# Patient Record
Sex: Female | Born: 1970 | Race: White | Hispanic: No | Marital: Married | Smoking: Never smoker
Health system: Southern US, Community
[De-identification: ages and names within clinical notes are randomized; demographics above are authoritative.]

## PROBLEM LIST (undated history)

## (undated) DIAGNOSIS — T8859XA Other complications of anesthesia, initial encounter: Secondary | ICD-10-CM

## (undated) DIAGNOSIS — I10 Essential (primary) hypertension: Secondary | ICD-10-CM

## (undated) DIAGNOSIS — K589 Irritable bowel syndrome without diarrhea: Secondary | ICD-10-CM

## (undated) DIAGNOSIS — R112 Nausea with vomiting, unspecified: Secondary | ICD-10-CM

## (undated) DIAGNOSIS — E669 Obesity, unspecified: Secondary | ICD-10-CM

## (undated) DIAGNOSIS — K649 Unspecified hemorrhoids: Secondary | ICD-10-CM

## (undated) DIAGNOSIS — G4730 Sleep apnea, unspecified: Secondary | ICD-10-CM

## (undated) DIAGNOSIS — N979 Female infertility, unspecified: Secondary | ICD-10-CM

## (undated) DIAGNOSIS — N83209 Unspecified ovarian cyst, unspecified side: Secondary | ICD-10-CM

## (undated) DIAGNOSIS — Z9889 Other specified postprocedural states: Secondary | ICD-10-CM

## (undated) DIAGNOSIS — F3281 Premenstrual dysphoric disorder: Secondary | ICD-10-CM

## (undated) DIAGNOSIS — F32A Depression, unspecified: Secondary | ICD-10-CM

## (undated) DIAGNOSIS — Z9289 Personal history of other medical treatment: Secondary | ICD-10-CM

## (undated) DIAGNOSIS — F419 Anxiety disorder, unspecified: Secondary | ICD-10-CM

## (undated) DIAGNOSIS — N809 Endometriosis, unspecified: Secondary | ICD-10-CM

## (undated) DIAGNOSIS — K219 Gastro-esophageal reflux disease without esophagitis: Secondary | ICD-10-CM

## (undated) DIAGNOSIS — M1990 Unspecified osteoarthritis, unspecified site: Secondary | ICD-10-CM

## (undated) DIAGNOSIS — F329 Major depressive disorder, single episode, unspecified: Secondary | ICD-10-CM

## (undated) DIAGNOSIS — IMO0001 Reserved for inherently not codable concepts without codable children: Secondary | ICD-10-CM

## (undated) DIAGNOSIS — R519 Headache, unspecified: Secondary | ICD-10-CM

## (undated) DIAGNOSIS — R42 Dizziness and giddiness: Secondary | ICD-10-CM

## (undated) DIAGNOSIS — Z8489 Family history of other specified conditions: Secondary | ICD-10-CM

## (undated) DIAGNOSIS — J329 Chronic sinusitis, unspecified: Secondary | ICD-10-CM

## (undated) DIAGNOSIS — L900 Lichen sclerosus et atrophicus: Secondary | ICD-10-CM

## (undated) DIAGNOSIS — N946 Dysmenorrhea, unspecified: Secondary | ICD-10-CM

## (undated) DIAGNOSIS — F319 Bipolar disorder, unspecified: Secondary | ICD-10-CM

## (undated) DIAGNOSIS — T4145XA Adverse effect of unspecified anesthetic, initial encounter: Secondary | ICD-10-CM

## (undated) DIAGNOSIS — A6000 Herpesviral infection of urogenital system, unspecified: Secondary | ICD-10-CM

## (undated) HISTORY — DX: Essential (primary) hypertension: I10

## (undated) HISTORY — DX: Anxiety disorder, unspecified: F41.9

## (undated) HISTORY — DX: Lichen sclerosus et atrophicus: L90.0

## (undated) HISTORY — DX: Female infertility, unspecified: N97.9

## (undated) HISTORY — DX: Unspecified hemorrhoids: K64.9

## (undated) HISTORY — DX: Herpesviral infection of urogenital system, unspecified: A60.00

## (undated) HISTORY — DX: Major depressive disorder, single episode, unspecified: F32.9

## (undated) HISTORY — PX: OVARIAN CYST REMOVAL: SHX89

## (undated) HISTORY — DX: Personal history of other medical treatment: Z92.89

## (undated) HISTORY — DX: Irritable bowel syndrome, unspecified: K58.9

## (undated) HISTORY — PX: DIAGNOSTIC LAPAROSCOPY: SUR761

## (undated) HISTORY — DX: Chronic sinusitis, unspecified: J32.9

## (undated) HISTORY — DX: Gastro-esophageal reflux disease without esophagitis: K21.9

## (undated) HISTORY — DX: Depression, unspecified: F32.A

## (undated) HISTORY — DX: Obesity, unspecified: E66.9

## (undated) HISTORY — PX: URETHRAL DILATION: SUR417

## (undated) HISTORY — DX: Endometriosis, unspecified: N80.9

## (undated) HISTORY — DX: Unspecified ovarian cyst, unspecified side: N83.209

## (undated) HISTORY — DX: Premenstrual dysphoric disorder: F32.81

## (undated) HISTORY — DX: Dysmenorrhea, unspecified: N94.6

## (undated) HISTORY — DX: Bipolar disorder, unspecified: F31.9

---

## 1996-11-25 DIAGNOSIS — N83209 Unspecified ovarian cyst, unspecified side: Secondary | ICD-10-CM

## 1996-11-25 HISTORY — DX: Unspecified ovarian cyst, unspecified side: N83.209

## 2006-02-11 ENCOUNTER — Ambulatory Visit: Payer: Self-pay | Admitting: Otolaryngology

## 2011-02-15 ENCOUNTER — Ambulatory Visit: Payer: Self-pay | Admitting: Podiatry

## 2011-10-24 ENCOUNTER — Emergency Department: Payer: Self-pay | Admitting: Emergency Medicine

## 2013-11-25 HISTORY — PX: UPPER GI ENDOSCOPY: SHX6162

## 2014-02-17 ENCOUNTER — Ambulatory Visit: Payer: Self-pay | Admitting: Gastroenterology

## 2014-02-18 LAB — PATHOLOGY REPORT

## 2015-03-08 DIAGNOSIS — Z9289 Personal history of other medical treatment: Secondary | ICD-10-CM

## 2015-03-08 HISTORY — DX: Personal history of other medical treatment: Z92.89

## 2015-03-22 ENCOUNTER — Ambulatory Visit: Payer: Self-pay | Admitting: Cardiovascular Disease

## 2015-03-22 ENCOUNTER — Ambulatory Visit (INDEPENDENT_AMBULATORY_CARE_PROVIDER_SITE_OTHER): Payer: 59 | Admitting: Cardiovascular Disease

## 2015-03-22 ENCOUNTER — Encounter: Payer: Self-pay | Admitting: Cardiovascular Disease

## 2015-03-22 ENCOUNTER — Encounter (INDEPENDENT_AMBULATORY_CARE_PROVIDER_SITE_OTHER): Payer: Self-pay

## 2015-03-22 VITALS — BP 120/92 | HR 94 | Ht 64.0 in | Wt 234.0 lb

## 2015-03-22 DIAGNOSIS — I10 Essential (primary) hypertension: Secondary | ICD-10-CM | POA: Insufficient documentation

## 2015-03-22 DIAGNOSIS — F319 Bipolar disorder, unspecified: Secondary | ICD-10-CM | POA: Insufficient documentation

## 2015-03-22 DIAGNOSIS — R079 Chest pain, unspecified: Secondary | ICD-10-CM | POA: Diagnosis not present

## 2015-03-22 DIAGNOSIS — F32A Depression, unspecified: Secondary | ICD-10-CM | POA: Insufficient documentation

## 2015-03-22 DIAGNOSIS — Z9289 Personal history of other medical treatment: Secondary | ICD-10-CM

## 2015-03-22 DIAGNOSIS — R600 Localized edema: Secondary | ICD-10-CM | POA: Diagnosis not present

## 2015-03-22 DIAGNOSIS — R0602 Shortness of breath: Secondary | ICD-10-CM | POA: Insufficient documentation

## 2015-03-22 DIAGNOSIS — F329 Major depressive disorder, single episode, unspecified: Secondary | ICD-10-CM

## 2015-03-22 HISTORY — DX: Personal history of other medical treatment: Z92.89

## 2015-03-22 MED ORDER — FUROSEMIDE 20 MG PO TABS: 20.0000 mg | ORAL_TABLET | Freq: Two times a day (BID) | ORAL | Status: DC | PRN

## 2015-03-22 MED ORDER — POTASSIUM CHLORIDE ER 10 MEQ PO TBCR: 10.0000 meq | EXTENDED_RELEASE_TABLET | Freq: Two times a day (BID) | ORAL | Status: DC | PRN

## 2015-03-22 NOTE — Assessment & Plan Note (Signed)
I suspect her weight is contributing to much of her problems. This was discussed with her. Recommended no [facility], dietary changes, cutting out fast food, starting her walking program. If unable to ambulate, would try water aerobics, low impact exercise

## 2015-03-22 NOTE — Progress Notes (Signed)
Patient ID: Ashley Floyd, female    DOB: October 02, 1971, 44 y.o.   MRN: [id number]  HPI Comments: [name] is a 44 year old woman with obesity, bipolar disorder, depression, previous primary care through [facility] (no longer available), who presents for evaluation of her hypertension and chest tightness, shortness of breath. She reports weight gain, dietary indiscretion. Significant family history of diabetes.  She reports having high blood pressure noted fall of 2015 through work screening. 141/100. Started on HCTZ 25 mg daily. Did not seem to help her blood pressure. Was changed to losartan HCTZ 50/12.5 mill grams daily. Blood pressure improved. She started having palpitations felt in her face, commonly at nighttime. Started on metoprolol 25 mg. She takes this in the morning. This has helped both headaches and palpitations.  She is concerned about effect of metoprolol and causing diabetes. She does have recent weight gain and concerned this is medication related. Drinks [employer] through the day, fast food, no exercise.  She reports having significant swelling in her hands, abdomen, tightness in her chest at times with some shortness of breath on exertion. She reports changes to her hair, unclear if this is from blood pressure pills. Previously was walking 3 miles per day, lost 54 pounds. After separation and secondary to depression, became inactive/sedentary. Also with discomfort in her feet EKG on today's visit shows normal sinus rhythm with rate 94 bpm, no significant ST or T-wave changes  Recent lab work October 2015 showing normal BMP, low vitamin D    Allergies  Allergen Reactions  . Cephalosporins Rash  . Other Other (See Comments)    Uncoded Allergy. Allergen: amiceft, Other Reaction: Not Assessed  . Penicillins Other (See Comments)    Yeast  Rash     Outpatient Encounter Prescriptions as of 03/22/2015  Medication Sig  . carbamazepine (TEGRETOL) 200 MG tablet Take  200 mg by mouth 3 (three) times daily.   . clonazePAM (KLONOPIN) 1 MG tablet Take 1 mg by mouth 3 (three) times daily as needed.   Marland Kitchen DEXILANT 60 MG capsule Take 60 mg by mouth daily.   Marland Kitchen losartan-hydrochlorothiazide (HYZAAR) 50-12.5 MG per tablet Take 1 tablet by mouth daily.   . metoprolol tartrate (LOPRESSOR) 25 MG tablet Take 25 mg by mouth 2 (two) times daily.   . naproxen sodium (ANAPROX) 550 MG tablet Take 550 mg by mouth 2 (two) times daily with a meal.   . triamcinolone (NASACORT) 55 MCG/ACT AERO nasal inhaler Place 2 sprays into the nose daily.  . valACYclovir (VALTREX) 500 MG tablet Take 500 mg by mouth 3 (three) times daily as needed.   . Vitamin D, Ergocalciferol, (DRISDOL) 50000 UNITS CAPS capsule Take 50,000 Units by mouth every 7 (seven) days.  . furosemide (LASIX) 20 MG tablet Take 1 tablet (20 mg total) by mouth 2 (two) times daily as needed.  . potassium chloride (K-DUR) 10 MEQ tablet Take 1 tablet (10 mEq total) by mouth 2 (two) times daily as needed.  . [DISCONTINUED] hydrochlorothiazide (HYDRODIURIL) 25 MG tablet Take 25 mg by mouth daily.  . [DISCONTINUED] lamoTRIgine (LAMICTAL) 200 MG tablet Take 200 mg by mouth daily.   . [DISCONTINUED] zaleplon (SONATA) 10 MG capsule Take 10 mg by mouth at bedtime as needed for sleep.    Past Medical History  Diagnosis Date  . Depression   . Dysmenorrhea   . Endometriosis   . GERD (gastroesophageal reflux disease)   . Herpes genitalis   . IBS (irritable bowel syndrome)   .  Obesity   . Ovarian cyst 1998    right  . Bipolar affective disorder   . Hypertension     Past Surgical History  Procedure Laterality Date  . Ovarian cyst removal    . Urethral dilation      Social History  reports that she has never smoked. She does not have any smokeless tobacco history on file. She reports that she does not drink alcohol or use illicit drugs.  Family History family history includes Arrhythmia in her mother; Heart attack in her  paternal grandmother; Heart disease in her maternal aunt and paternal grandmother; Heart failure in her maternal aunt; Hypertension in her maternal grandfather and maternal grandmother.  Review of Systems  Constitutional: Negative.   Respiratory: Positive for shortness of breath.   Cardiovascular: Positive for leg swelling.  Gastrointestinal: Negative.   Musculoskeletal: Positive for arthralgias.       Swelling of her hands  Skin: Negative.   Neurological: Positive for headaches.  Hematological: Negative.   Psychiatric/Behavioral: Negative.   All other systems reviewed and are negative.   BP 120/92 mmHg  Pulse 94  Ht 5\' 4"  (1.626 m)  Wt 234 lb (106.142 kg)  BMI 40.15 kg/m2  Physical Exam  Constitutional: She is oriented to person, place, and time. She appears well-developed and well-nourished.  HENT:  Head: Normocephalic.  Nose: Nose normal.  Mouth/Throat: Oropharynx is clear and moist.  Eyes: Conjunctivae are normal. Pupils are equal, round, and reactive to light.  Neck: Normal range of motion. Neck supple. No JVD present.  Cardiovascular: Normal rate, regular rhythm, S1 normal, S2 normal, normal heart sounds and intact distal pulses.  Exam reveals no gallop and no friction rub.   No murmur heard. Pulmonary/Chest: Effort normal and breath sounds normal. No respiratory distress. She has no wheezes. She has no rales. She exhibits no tenderness.  Abdominal: Soft. Bowel sounds are normal. She exhibits no distension. There is no tenderness.  Musculoskeletal: Normal range of motion. She exhibits no edema or tenderness.  Lymphadenopathy:    She has no cervical adenopathy.  Neurological: She is alert and oriented to person, place, and time. Coordination normal.  Skin: Skin is warm and dry. No rash noted. No erythema.  Psychiatric: She has a normal mood and affect. Her behavior is normal. Judgment and thought content normal.    Assessment and Plan  Nursing note and vitals  reviewed.

## 2015-03-22 NOTE — Patient Instructions (Addendum)
Consider moving the metoprolol to dinner time or bedtime, If this makes you tired in the AM, Call the office, We could try propranolol  Ok to stop or wean the metoprolol (try a 1/2 pill), ok to take as needed  Please take lasix as needed with potassium for leg swelling, hand/finger swelling, shortness of breath  We will schedule an echocardiogram for shortness of breath, leg edema  Monitor your blood pressure, consider a home cuff  Please call us if you have new issues that need to be addressed before your next appt.

## 2015-03-22 NOTE — Assessment & Plan Note (Signed)
Previous depression likely contributing to sedentary lifestyle, inactivity, weight gain Recommended she restart a walking program

## 2015-03-22 NOTE — Assessment & Plan Note (Signed)
Shortness of breath seems to contribute to her mild chest tightness. Again likely from deconditioning, also fluid retention We'll start Lasix

## 2015-03-22 NOTE — Assessment & Plan Note (Addendum)
Shortness of breath possibly from fluid retention, diastolic dysfunction and heart failure. We will start Lasix when necessary with potassium. Fluid retention likely exacerbated by obesity. Recommended weight loss We'll need to monitor closely for sleep apnea  echocardiogram has been ordered to rule out structural heart disease, pulmonary hypertension

## 2015-03-22 NOTE — Assessment & Plan Note (Signed)
Very mild edema likely exacerbated by obesity. Unable to exclude mild fluid retention

## 2015-03-22 NOTE — Assessment & Plan Note (Signed)
High risk of diastolic CHF given her body habitus, high fluid and salt intake. She has swelling in her hands, legs, abdomen, weight gain. Also with shortness of breath, chest tightness with exertion. Recommended she try Lasix as needed with potassium. Recommended she try to cut back on her soft drinks, and high volume fluids in general. Other medications The same except suggested she either cut down on her metoprolol or wean off if she is concerned about the possibility this may cause diabetes Other option would be to use propranolol when necessary for palpitations at nighttime but not on a regular basis

## 2015-04-20 ENCOUNTER — Telehealth: Payer: Self-pay | Admitting: *Deleted

## 2015-04-20 DIAGNOSIS — R600 Localized edema: Secondary | ICD-10-CM

## 2015-04-20 DIAGNOSIS — R0602 Shortness of breath: Secondary | ICD-10-CM

## 2015-04-20 NOTE — Telephone Encounter (Signed)
ECHO orders were placed by Dr. Rockey Situ at pt's ov 03/22/15. We will need to wait on pt's ECHO results before adjusting her meds.

## 2015-04-20 NOTE — Telephone Encounter (Signed)
Pt is calling asking if we can put in her orders for Echo, she is coming tmrw  Pt is asking also if she can up the dose to 50/25 on Fluid pills She was told to play around with this to better fit her situation, but it just asking.  Losartan to  50  HTCZ to  25   If this is  okay, please [facility] to [facility] [place] not the [facility].  Please advise.

## 2015-04-21 ENCOUNTER — Other Ambulatory Visit: Payer: Self-pay | Admitting: *Deleted

## 2015-04-21 ENCOUNTER — Other Ambulatory Visit: Payer: Self-pay

## 2015-04-21 ENCOUNTER — Ambulatory Visit (INDEPENDENT_AMBULATORY_CARE_PROVIDER_SITE_OTHER): Payer: 59

## 2015-04-21 DIAGNOSIS — R0602 Shortness of breath: Secondary | ICD-10-CM | POA: Diagnosis not present

## 2015-04-21 DIAGNOSIS — R600 Localized edema: Secondary | ICD-10-CM

## 2015-04-21 MED ORDER — LOSARTAN POTASSIUM-HCTZ 50-12.5 MG PO TABS: 1.0000 | ORAL_TABLET | Freq: Every day | ORAL | Status: DC

## 2015-04-21 NOTE — Telephone Encounter (Signed)
[facility] [phone]

## 2015-06-21 ENCOUNTER — Other Ambulatory Visit: Payer: Self-pay

## 2015-06-21 MED ORDER — LOSARTAN POTASSIUM-HCTZ 50-12.5 MG PO TABS: 1.0000 | ORAL_TABLET | Freq: Every day | ORAL | Status: DC

## 2015-06-28 ENCOUNTER — Other Ambulatory Visit: Payer: Self-pay

## 2015-06-28 DIAGNOSIS — K219 Gastro-esophageal reflux disease without esophagitis: Secondary | ICD-10-CM

## 2015-06-28 MED ORDER — DEXLANSOPRAZOLE 60 MG PO CPDR: 60.0000 mg | DELAYED_RELEASE_CAPSULE | Freq: Every day | ORAL | Status: DC

## 2015-07-14 ENCOUNTER — Encounter: Payer: Self-pay | Admitting: Gynecology

## 2015-07-14 ENCOUNTER — Other Ambulatory Visit: Payer: Self-pay

## 2015-07-14 ENCOUNTER — Telehealth: Payer: Self-pay | Admitting: Gastroenterology

## 2015-07-14 ENCOUNTER — Ambulatory Visit
Admission: EM | Admit: 2015-07-14 | Discharge: 2015-07-14 | Disposition: A | Discharge status: Home / Self Care | Payer: 59 | Attending: Family Medicine | Admitting: Family Medicine

## 2015-07-14 DIAGNOSIS — Z8719 Personal history of other diseases of the digestive system: Secondary | ICD-10-CM

## 2015-07-14 DIAGNOSIS — R0789 Other chest pain: Secondary | ICD-10-CM | POA: Diagnosis not present

## 2015-07-14 DIAGNOSIS — K219 Gastro-esophageal reflux disease without esophagitis: Secondary | ICD-10-CM | POA: Diagnosis present

## 2015-07-14 DIAGNOSIS — R079 Chest pain, unspecified: Secondary | ICD-10-CM | POA: Diagnosis not present

## 2015-07-14 MED ORDER — GI COCKTAIL ~~LOC~~
30.0000 mL | Freq: Once | ORAL | Status: AC
  Administered 2015-07-14: 30 mL via ORAL

## 2015-07-14 MED ORDER — SUCRALFATE 1 G PO TABS: 2.0000 g | ORAL_TABLET | Freq: Two times a day (BID) | ORAL | Status: DC

## 2015-07-14 NOTE — ED Provider Notes (Signed)
CSN: [id number]     Arrival date & time 07/14/15  1721 History   First MD Initiated Contact with Patient 07/14/15 1810     Chief Complaint  Patient presents with  . Gastrophageal Reflux   (Consider location/radiation/quality/duration/timing/severity/associated sxs/prior Treatment) Patient is a 44 y.o. female presenting with GERD. The history is provided by the patient and the spouse. No language interpreter was used.  Gastrophageal Reflux This is a recurrent problem. The current episode started more than 1 week ago (Off and on last 2 weeks). The problem occurs constantly. The problem has been gradually worsening. Associated symptoms include chest pain and abdominal pain. Pertinent negatives include no headaches and no shortness of breath. The symptoms are aggravated by bending. Nothing relieves the symptoms. Treatments tried: She's been using an acids and PPIs without success.      Patient's medical history was reviewed she has been extensive history of workup for this reflux and GI disease she's had cardiac workup in the past as well as endoscopy showing early Barrett's esophagitis. She is to have evaluation by special gastroneurologist for diagnosis hernia and hopefully or possible endoscopy or surgical treatment for it. She talked to her regular gastrologist intake nurse who suggested she come here for GI cocktail to see if it would help.  Her mother has been diagnosed with Barrett's esophagitis.   while she never smoked she did state that the first 10 years of her life she did experience that hand smoke from her mother.    Past Medical History  Diagnosis Date  . Depression   . Dysmenorrhea   . Endometriosis   . GERD (gastroesophageal reflux disease)   . Herpes genitalis   . IBS (irritable bowel syndrome)   . Obesity   . Ovarian cyst 1998    right  . Bipolar affective disorder   . Hypertension    Past Surgical History  Procedure Laterality Date  . Ovarian cyst removal    .  Urethral dilation     Family History  Problem Relation Age of Onset  . Arrhythmia Mother   . Hypertension Maternal Grandmother   . Hypertension Maternal Grandfather   . Heart disease Paternal Grandmother   . Heart attack Paternal Grandmother   . Heart failure Maternal Aunt   . Heart disease Maternal Aunt     CABG   Social History  Substance Use Topics  . Smoking status: Never Smoker   . Smokeless tobacco: None  . Alcohol Use: No   OB History    No data available     Review of Systems  Respiratory: Negative for shortness of breath.   Cardiovascular: Positive for chest pain.  Gastrointestinal: Positive for abdominal pain.  Neurological: Negative for headaches.    Allergies  Cephalosporins; Other; and Penicillins  Home Medications   Prior to Admission medications   Medication Sig Start Date End Date Taking? Authorizing Provider  carbamazepine (TEGRETOL) 200 MG tablet Take 200 mg by mouth 3 (three) times daily.  01/23/15  Yes Historical Provider, MD  clonazePAM (KLONOPIN) 1 MG tablet Take 1 mg by mouth 3 (three) times daily as needed.  01/30/15  Yes Historical Provider, MD  dexlansoprazole (DEXILANT) 60 MG capsule Take 1 capsule (60 mg total) by mouth daily. 06/28/15  Yes [name], MD  furosemide (LASIX) 20 MG tablet Take 1 tablet (20 mg total) by mouth 2 (two) times daily as needed. 03/22/15  Yes Minna Merritts, MD  losartan-hydrochlorothiazide (HYZAAR) 50-12.5 MG per tablet Take 1  tablet by mouth daily. 06/21/15  Yes Minna Merritts, MD  naproxen sodium (ANAPROX) 550 MG tablet Take 550 mg by mouth 2 (two) times daily with a meal.  03/08/15  Yes Historical Provider, MD  potassium chloride (K-DUR) 10 MEQ tablet Take 1 tablet (10 mEq total) by mouth 2 (two) times daily as needed. 03/22/15  Yes Minna Merritts, MD  triamcinolone (NASACORT) 55 MCG/ACT AERO nasal inhaler Place 2 sprays into the nose daily.   Yes Historical Provider, MD  valACYclovir (VALTREX) 500 MG tablet Take 500  mg by mouth 3 (three) times daily as needed.  03/08/15  Yes Historical Provider, MD  metoprolol tartrate (LOPRESSOR) 25 MG tablet Take 25 mg by mouth 2 (two) times daily.  03/15/15   Historical Provider, MD  sucralfate (CARAFATE) 1 G tablet Take 2 tablets (2 g total) by mouth 2 (two) times daily. An hour before eating. 07/14/15   Frederich Cha, MD  Vitamin D, Ergocalciferol, (DRISDOL) 50000 UNITS CAPS capsule Take 50,000 Units by mouth every 7 (seven) days.    Historical Provider, MD   BP 121/76 mmHg  Pulse 100  Temp(Src) 98.5 F (36.9 C) (Oral)  Resp 20  Ht 5\' 4"  (1.626 m)  Wt 234 lb (106.142 kg)  BMI 40.15 kg/m2  SpO2 98%  LMP 07/01/2015 Physical Exam  Constitutional: She is oriented to person, place, and time. She appears well-developed and well-nourished.  Obese white female  HENT:  Head: Normocephalic and atraumatic.  Eyes: Pupils are equal, round, and reactive to light.  Neck: Normal range of motion. No tracheal deviation present. No thyromegaly present.  Cardiovascular: Normal rate, regular rhythm and normal heart sounds.   Pulmonary/Chest: Effort normal and breath sounds normal. No respiratory distress. She has no wheezes.  Abdominal: Soft. Bowel sounds are normal. There is tenderness.  Musculoskeletal: Normal range of motion. She exhibits no edema.  Neurological: She is alert and oriented to person, place, and time.  Skin: Skin is warm and dry.  Psychiatric: She has a normal mood and affect. Her behavior is normal.  Vitals reviewed.   ED Course  Procedures (including critical care time) Labs Review Labs Reviewed - No data to display  Imaging Review No results found.   EKG was normal sinus rhythm.  MDM   1. H/O gastroesophageal reflux (GERD)   2. Other chest pain       Patient had much improvement after GI cocktail. We'll place her on Carafate 2 tablets twice a day follow-up with the GI specialist at her scheduled appointment work note for today.   Frederich Cha,  MD 07/14/15 [phone]

## 2015-07-14 NOTE — Discharge Instructions (Signed)
Chest Pain (Nonspecific) It is often hard to give a diagnosis for the cause of chest pain. There is always a chance that your pain could be related to something serious, such as a heart attack or a blood clot in the lungs. You need to follow up with your doctor. HOME CARE  If antibiotic medicine was given, take it as directed by your doctor. Finish the medicine even if you start to feel better.  For the next few days, avoid activities that bring on chest pain. Continue physical activities as told by your doctor.  Do not use any tobacco products. This includes cigarettes, chewing tobacco, and e-cigarettes.  Avoid drinking alcohol.  Only take medicine as told by your doctor.  Follow your doctor's suggestions for more testing if your chest pain does not go away.  Keep all doctor visits you made. GET HELP IF:  Your chest pain does not go away, even after treatment.  You have a rash with blisters on your chest.  You have a fever. GET HELP RIGHT AWAY IF:   You have more pain or pain that spreads to your arm, neck, jaw, back, or belly (abdomen).  You have shortness of breath.  You cough more than usual or cough up blood.  You have very bad back or belly pain.  You feel sick to your stomach (nauseous) or throw up (vomit).  You have very bad weakness.  You pass out (faint).  You have chills. This is an emergency. Do not wait to see if the problems will go away. Call your local emergency services (911 in [place] Do not drive yourself to the hospital. MAKE SURE YOU:   Understand these instructions.  Will watch your condition.  Will get help right away if you are not doing well or get worse. Document Released: 04/29/2008 Document Revised: 11/16/2013 Document Reviewed: 04/29/2008 [facility] Patient Information 2015 [place], [place]. This information is not intended to replace advice given to you by your health care provider. Make sure you discuss any questions you have with your  health care provider. Gastroesophageal Reflux Disease, Adult Gastroesophageal reflux disease (GERD) happens when acid from your stomach flows up into the esophagus. When acid comes in contact with the esophagus, the acid causes soreness (inflammation) in the esophagus. Over time, GERD may create small holes (ulcers) in the lining of the esophagus. CAUSES   Increased body weight. This puts pressure on the stomach, making acid rise from the stomach into the esophagus.  Smoking. This increases acid production in the stomach.  Drinking alcohol. This causes decreased pressure in the lower esophageal sphincter (valve or ring of muscle between the esophagus and stomach), allowing acid from the stomach into the esophagus.  Late evening meals and a full stomach. This increases pressure and acid production in the stomach.  A malformed lower esophageal sphincter. Sometimes, no cause is found. SYMPTOMS   Burning pain in the lower part of the mid-chest behind the breastbone and in the mid-stomach area. This may occur twice a week or more often.  Trouble swallowing.  Sore throat.  Dry cough.  Asthma-like symptoms including chest tightness, shortness of breath, or wheezing. DIAGNOSIS  Your caregiver may be able to diagnose GERD based on your symptoms. In some cases, X-rays and other tests may be done to check for complications or to check the condition of your stomach and esophagus. TREATMENT  Your caregiver may recommend over-the-counter or prescription medicines to help decrease acid production. Ask your caregiver before starting or  adding any new medicines.  HOME CARE INSTRUCTIONS   Change the factors that you can control. Ask your caregiver for guidance concerning weight loss, quitting smoking, and alcohol consumption.  Avoid foods and drinks that make your symptoms worse, such as:  Caffeine or alcoholic drinks.  Chocolate.  Peppermint or mint flavorings.  Garlic and onions.  Spicy  foods.  Citrus fruits, such as oranges, lemons, or limes.  Tomato-based foods such as sauce, chili, salsa, and pizza.  Fried and fatty foods.  Avoid lying down for the 3 hours prior to your bedtime or prior to taking a nap.  Eat small, frequent meals instead of large meals.  Wear loose-fitting clothing. Do not wear anything tight around your waist that causes pressure on your stomach.  Raise the head of your bed 6 to 8 inches with wood blocks to help you sleep. Extra pillows will not help.  Only take over-the-counter or prescription medicines for pain, discomfort, or fever as directed by your caregiver.  Do not take aspirin, ibuprofen, or other nonsteroidal anti-inflammatory drugs (NSAIDs). SEEK IMMEDIATE MEDICAL CARE IF:   You have pain in your arms, neck, jaw, teeth, or back.  Your pain increases or changes in intensity or duration.  You develop nausea, vomiting, or sweating (diaphoresis).  You develop shortness of breath, or you faint.  Your vomit is green, yellow, black, or looks like coffee grounds or blood.  Your stool is red, bloody, or black. These symptoms could be signs of other problems, such as heart disease, gastric bleeding, or esophageal bleeding. MAKE SURE YOU:   Understand these instructions.  Will watch your condition.  Will get help right away if you are not doing well or get worse. Document Released: 08/21/2005 Document Revised: 02/03/2012 Document Reviewed: 05/31/2011 [employer] Patient Information 2015 [place], [place]. This information is not intended to replace advice given to you by your health care provider. Make sure you discuss any questions you have with your health care provider.

## 2015-07-14 NOTE — Telephone Encounter (Signed)
Pt would like for you to call her. 

## 2015-07-14 NOTE — ED Notes (Signed)
Patient c/o acid reflux getting worse. Per patient taking Rx. Dexilant. Per patient diagnose with Hiatal Hernia and causing pressure and acidity in her chest.

## 2015-07-26 DIAGNOSIS — K589 Irritable bowel syndrome without diarrhea: Secondary | ICD-10-CM | POA: Insufficient documentation

## 2015-07-26 DIAGNOSIS — E66812 Obesity, class 2: Secondary | ICD-10-CM | POA: Insufficient documentation

## 2015-07-26 DIAGNOSIS — E669 Obesity, unspecified: Secondary | ICD-10-CM | POA: Insufficient documentation

## 2015-08-10 ENCOUNTER — Telehealth: Payer: Self-pay | Admitting: Cardiovascular Disease

## 2015-08-10 DIAGNOSIS — R079 Chest pain, unspecified: Secondary | ICD-10-CM

## 2015-08-10 NOTE — Telephone Encounter (Signed)
Per Patient GI specialist clinic at [facility] needs full card work up including stress test prior to treatment.  Patient is seeing NP, Laureen Abrahams.   Please call patient to discuss or call clinic nurse Shirlean Mylar, [phone] and I can relay to patient care plan if needed   [place]!!

## 2015-08-11 NOTE — Telephone Encounter (Signed)
LMOV for patient to call office .

## 2015-08-11 NOTE — Telephone Encounter (Signed)
Patient wants  Rockey Situ to go ahead and schedule stress test and she can come see him same day to talk about clearance she doesn't want to take multiple days off work.

## 2015-08-11 NOTE — Telephone Encounter (Signed)
Pt can either set up an appt w/ Dr. Rockey Situ to discuss what he feels she needs or [facility] can order the tests. I recommend she discuss w/ Dr. Rockey Situ, as he may not feel that she needs that much testing.

## 2015-08-12 NOTE — Telephone Encounter (Signed)
Okay to go ahead and order pharmacologic Myoview Indication would be shortness of breath, chest discomfort. Poor candidate for treadmill study, likely will need pharmacologic study. We can call her with the results and can forward to [facility] (they should have access through [place]) Would try to schedule on day I am in the hospital

## 2015-08-14 NOTE — Telephone Encounter (Signed)
Spoke w/ pt. Advised her of Dr. Donivan Scull recommendation.  She verbalizes understanding and is sched for Lexiscan on  Reviewed instructions w/ pt and she understands the following:  Date of Procedure:______Tuesday, Oct 4____________  Arrival Time for Procedure:_____7:45 am_____________  __X__:  Hold METOPROLOL the night before procedure and morning of procedure  *Pt reports she has not been taking metoprolol due to reported risk of DM  1. Do not eat or drink after midnight 2. No caffeine for 24 hours prior to test 3. No smoking 24 hours prior to test. 4. Your medication may be taken with water.  If your doctor stopped a medication because of this test, do not take that medication. 5. Ladies, please do not wear dresses.  Skirts or pants are appropriate. Please wear a short sleeve shirt. 6. No perfume, cologne or lotion.

## 2015-08-18 ENCOUNTER — Ambulatory Visit
Admission: RE | Admit: 2015-08-18 | Discharge: 2015-08-18 | Disposition: A | Discharge status: Home / Self Care | Payer: 59 | Source: Ambulatory Visit | Attending: Specialist | Admitting: Specialist

## 2015-08-18 ENCOUNTER — Other Ambulatory Visit: Payer: Self-pay | Admitting: Specialist

## 2015-08-18 DIAGNOSIS — R0602 Shortness of breath: Secondary | ICD-10-CM | POA: Diagnosis present

## 2015-08-18 DIAGNOSIS — J9811 Atelectasis: Secondary | ICD-10-CM | POA: Insufficient documentation

## 2015-08-18 DIAGNOSIS — R079 Chest pain, unspecified: Secondary | ICD-10-CM

## 2015-08-18 MED ORDER — IOHEXOL 350 MG/ML SOLN
100.0000 mL | Freq: Once | INTRAVENOUS | Status: AC | PRN
  Administered 2015-08-18: 100 mL via INTRAVENOUS

## 2015-08-22 ENCOUNTER — Other Ambulatory Visit: Payer: Self-pay | Admitting: Specialist

## 2015-08-22 DIAGNOSIS — R0602 Shortness of breath: Secondary | ICD-10-CM

## 2015-08-22 DIAGNOSIS — R9389 Abnormal findings on diagnostic imaging of other specified body structures: Secondary | ICD-10-CM

## 2015-08-23 ENCOUNTER — Other Ambulatory Visit: Payer: 59

## 2015-08-29 ENCOUNTER — Ambulatory Visit
Admission: RE | Admit: 2015-08-29 | Discharge: 2015-08-29 | Disposition: A | Discharge status: Home / Self Care | Payer: 59 | Source: Ambulatory Visit | Attending: Specialist | Admitting: Specialist

## 2015-08-29 ENCOUNTER — Ambulatory Visit: Payer: 59

## 2015-08-29 ENCOUNTER — Ambulatory Visit
Admission: RE | Admit: 2015-08-29 | Discharge: 2015-08-29 | Disposition: A | Discharge status: Home / Self Care | Payer: 59 | Source: Ambulatory Visit | Attending: Cardiovascular Disease | Admitting: Cardiovascular Disease

## 2015-08-29 ENCOUNTER — Other Ambulatory Visit: Payer: Self-pay

## 2015-08-29 DIAGNOSIS — R079 Chest pain, unspecified: Secondary | ICD-10-CM | POA: Diagnosis not present

## 2015-08-29 DIAGNOSIS — R0602 Shortness of breath: Secondary | ICD-10-CM

## 2015-08-29 MED ORDER — REGADENOSON 0.4 MG/5ML IV SOLN
0.4000 mg | Freq: Once | INTRAVENOUS | Status: AC
  Administered 2015-08-29: 0.4 mg via INTRAVENOUS

## 2015-08-29 MED ORDER — TECHNETIUM TC 99M SESTAMIBI - CARDIOLITE
13.6900 | Freq: Once | INTRAVENOUS | Status: AC | PRN
  Administered 2015-08-29: 13.69 via INTRAVENOUS

## 2015-08-29 MED ORDER — TECHNETIUM TC 99M SESTAMIBI - CARDIOLITE
30.0000 | Freq: Once | INTRAVENOUS | Status: AC | PRN
  Administered 2015-08-29: 31.68 via INTRAVENOUS

## 2015-08-30 ENCOUNTER — Telehealth: Payer: Self-pay | Admitting: General Surgery

## 2015-08-30 ENCOUNTER — Ambulatory Visit: Payer: 59 | Admitting: Physician Assistant

## 2015-08-30 LAB — NM MYOCAR MULTI W/SPECT W/WALL MOTION / EF
LV dias vol: 69 mL
LV sys vol: 19 mL
Peak HR: 116 {beats}/min
Percent HR: 66 %
Rest HR: 81 {beats}/min
SDS: 0
SRS: 0
SSS: 0
TID: 0.88

## 2015-08-30 NOTE — Telephone Encounter (Signed)
PT CALLED TO SEE IF SHE COULD COME HERE TO SEE YOU.SHE HAS SEEN YOU  IN 2000(GALLBLADDER) SHE HAS HAD THESES SYMPTOMS FOR ABOUT A YEAR(SHORTNESS OF BREATH,FEELS LIKE PRESSURE ON CHEST,UNABLE 99% OF THE TIME ABLE TO TAKE A DEEP BREATH & VERY TENDER UNDER HER BR BONE).SHE HAS SPOKE TO DR WOHL'S NURSE(GINGER) WHO HAS GIVEN HER DIRECTION WHO TO SEE.------- DR GOLLAN(CARDIOOGY)HAS HAD @ [facility] EKG,ECG & STRESS TEST.DR FLEMMING(PULMONARY)OERDERED CXR,LUNG CT,PFT'S & A SNIFF TEST@ [facility](FINDIGS WAS A 2CM SPOT ON LOWER LT LUNG)& KATHLEEN FERRELL NP IN [place],ORDERED A BARIUM SWALLOW,ESOPHOGEAL MANOMETRY,& A 24 HR IMPEDANCE.PT STATES OTHER THAN THE CT SCAN ALL WAS NORMAL. SHE WAS REF'D BY HER CHIROPRACTOR(DR STEVEN CECIL) TO YOU.HE STATED IT SOUNDED LIKE HER GALLBLADDER.SHE DOESN'T HAVE A PCP. WHAT DO YOU ADVISE?

## 2015-09-01 NOTE — Telephone Encounter (Signed)
Patient went to urgent care instead

## 2015-09-05 ENCOUNTER — Encounter: Payer: Self-pay | Admitting: General Surgery

## 2015-09-05 NOTE — Telephone Encounter (Signed)
09-05-15 ON PRINTED OUT COPY OF MESSAGE FROM 08-30-15 DR BYRNETT HAS WRITTEN OK FOR [place] TO [facility] AN APPT.APPT [facility] FOR 10-03-15 @ 4:00PM

## 2015-09-18 ENCOUNTER — Encounter: Payer: Self-pay | Admitting: Physician Assistant

## 2015-09-18 DIAGNOSIS — F419 Anxiety disorder, unspecified: Secondary | ICD-10-CM | POA: Insufficient documentation

## 2015-09-18 DIAGNOSIS — F4312 Post-traumatic stress disorder, chronic: Secondary | ICD-10-CM | POA: Insufficient documentation

## 2015-09-21 ENCOUNTER — Encounter: Payer: Self-pay | Admitting: Physician Assistant

## 2015-09-21 ENCOUNTER — Ambulatory Visit (INDEPENDENT_AMBULATORY_CARE_PROVIDER_SITE_OTHER): Payer: 59 | Admitting: Physician Assistant

## 2015-09-21 VITALS — BP 136/85 | HR 112 | Ht 66.0 in | Wt 239.5 lb

## 2015-09-21 DIAGNOSIS — R938 Abnormal findings on diagnostic imaging of other specified body structures: Secondary | ICD-10-CM

## 2015-09-21 DIAGNOSIS — I159 Secondary hypertension, unspecified: Secondary | ICD-10-CM | POA: Diagnosis not present

## 2015-09-21 DIAGNOSIS — F419 Anxiety disorder, unspecified: Secondary | ICD-10-CM | POA: Diagnosis not present

## 2015-09-21 DIAGNOSIS — I10 Essential (primary) hypertension: Secondary | ICD-10-CM

## 2015-09-21 DIAGNOSIS — R9389 Abnormal findings on diagnostic imaging of other specified body structures: Secondary | ICD-10-CM

## 2015-09-21 DIAGNOSIS — R0789 Other chest pain: Secondary | ICD-10-CM

## 2015-09-21 DIAGNOSIS — R0602 Shortness of breath: Secondary | ICD-10-CM

## 2015-09-21 MED ORDER — POTASSIUM CHLORIDE CRYS ER 20 MEQ PO TBCR: 20.0000 meq | EXTENDED_RELEASE_TABLET | Freq: Every day | ORAL | Status: DC

## 2015-09-21 MED ORDER — TORSEMIDE 20 MG PO TABS: 20.0000 mg | ORAL_TABLET | Freq: Every day | ORAL | Status: DC

## 2015-09-21 NOTE — Patient Instructions (Signed)
Medication Instructions:  Your physician has recommended you make the following change in your medication:  STOP taking lasix START taking torsemide 20mg  once per day on the weekends START taking potassium 20mg  once per day on the weekends w/torsemide   Labwork: BMET, CBC, BNP, TSH  Testing/Procedures: none  Follow-Up: Your physician recommends that you schedule a follow-up appointment in: one month with Dr. Rockey Situ   Any Other Special Instructions Will Be Listed Below (If Applicable). Referral to pulmonary      If you need a refill on your cardiac medications before your next appointment, please call your pharmacy.

## 2015-09-22 ENCOUNTER — Encounter: Payer: Self-pay | Admitting: Physician Assistant

## 2015-09-22 ENCOUNTER — Other Ambulatory Visit: Payer: Self-pay

## 2015-09-22 DIAGNOSIS — R0789 Other chest pain: Secondary | ICD-10-CM | POA: Insufficient documentation
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
## 2015-09-23 LAB — CBC
Hematocrit: 43.4 % (ref 34.0–46.6)
Hemoglobin: 15 g/dL (ref 11.1–15.9)
MCH: 31.4 pg (ref 26.6–33.0)
MCHC: 34.6 g/dL (ref 31.5–35.7)
MCV: 91 fL (ref 79–97)
Platelets: 284 10*3/uL (ref 150–379)
RBC: 4.77 x10E6/uL (ref 3.77–5.28)
RDW: 13 % (ref 12.3–15.4)
WBC: 7.5 10*3/uL (ref 3.4–10.8)

## 2015-09-23 LAB — BASIC METABOLIC PANEL
BUN/Creatinine Ratio: 14 (ref 9–23)
BUN: 11 mg/dL (ref 6–24)
CO2: 24 mmol/L (ref 18–29)
Calcium: 9.7 mg/dL (ref 8.7–10.2)
Chloride: 97 mmol/L (ref 97–106)
Creatinine, Ser: 0.81 mg/dL (ref 0.57–1.00)
GFR calc Af Amer: 101 mL/min/{1.73_m2} (ref >59)
GFR calc non Af Amer: 87 mL/min/{1.73_m2} (ref >59)
Glucose: 107 mg/dL — ABNORMAL HIGH (ref 65–99)
Potassium: 4.4 mmol/L (ref 3.5–5.2)
Sodium: 141 mmol/L (ref 136–144)

## 2015-09-23 LAB — BRAIN NATRIURETIC PEPTIDE: BNP: 16 pg/mL (ref 0.0–100.0)

## 2015-09-23 LAB — TSH: TSH: 0.547 u[IU]/mL (ref 0.450–4.500)

## 2015-09-25 ENCOUNTER — Telehealth: Payer: Self-pay | Admitting: *Deleted

## 2015-09-25 MED ORDER — TORSEMIDE 20 MG PO TABS: 20.0000 mg | ORAL_TABLET | Freq: Every day | ORAL | Status: DC

## 2015-09-25 NOTE — Telephone Encounter (Signed)
  STAT if patient is at the pharmacy , call can be transferred to refill team.   1. Which medications need to be refilled?  Torsimide torsemide (DEMADEX) 20 MG tablet  2. Which pharmacy/location is medication to be sent to? [facility]  3. Do they need a 30 day or 90 day supply?  30 day

## 2015-09-25 NOTE — Telephone Encounter (Signed)
Refill sent for Torsemide 20 mg to [facility].

## 2015-09-27 ENCOUNTER — Other Ambulatory Visit: Payer: Self-pay

## 2015-09-27 MED ORDER — TORSEMIDE 20 MG PO TABS: 20.0000 mg | ORAL_TABLET | Freq: Every day | ORAL | Status: DC

## 2015-09-28 ENCOUNTER — Other Ambulatory Visit: Payer: Self-pay | Admitting: Cardiovascular Disease

## 2015-09-28 MED ORDER — TORSEMIDE 20 MG PO TABS: 20.0000 mg | ORAL_TABLET | Freq: Every day | ORAL | Status: DC

## 2015-09-28 MED ORDER — POTASSIUM CHLORIDE CRYS ER 20 MEQ PO TBCR: 20.0000 meq | EXTENDED_RELEASE_TABLET | Freq: Every day | ORAL | Status: DC

## 2015-10-02 ENCOUNTER — Encounter: Payer: Self-pay | Admitting: *Deleted

## 2015-10-03 ENCOUNTER — Ambulatory Visit (INDEPENDENT_AMBULATORY_CARE_PROVIDER_SITE_OTHER): Payer: 59 | Admitting: General Surgery

## 2015-10-03 ENCOUNTER — Encounter: Payer: Self-pay | Admitting: General Surgery

## 2015-10-03 ENCOUNTER — Other Ambulatory Visit: Payer: Self-pay

## 2015-10-03 DIAGNOSIS — R1013 Epigastric pain: Secondary | ICD-10-CM | POA: Diagnosis not present

## 2015-10-03 MED ORDER — TORSEMIDE 20 MG PO TABS: 20.0000 mg | ORAL_TABLET | ORAL | Status: DC

## 2015-10-03 MED ORDER — POTASSIUM CHLORIDE CRYS ER 20 MEQ PO TBCR: 20.0000 meq | EXTENDED_RELEASE_TABLET | ORAL | Status: DC

## 2015-10-03 NOTE — Progress Notes (Addendum)
Patient ID: Ashley Floyd, female   DOB: 10/04/1971, 44 y.o.   MRN: [id number]  Chief Complaint  Patient presents with  . Other    gall bladder    HPI Ashley Floyd is a 44 y.o. female.  Here today for evaluation of her gall bladder. She states for about a year she has been having pressure in her chest, occasionally she describes and  "orange" size pressure in the epigastric region. She does admit to occasionally nausea. She has had epigastric pain with greasy foods like pizza. She does have shortness of breath at night. No vomiting. Bowels alter with constipation and diarrhea due to her IBS but averages 2 x week.  She has had a barium swallow at [facility] on 07-28-15.  Chest angiogram CT 08-18-15 by Dr Raul Del.  Sniff test on 08-29-15. Followed by Dr Rockey Situ for hypertension.  She likes to sing and finds she has trouble taking a deep breath and holding a note. She previously setting at her church but with her breathing difficulties she has limited her singing to the car. She works as a Government social research officer at [employer].   I personally reviewed the history with the patient. HPI  Past Medical History  Diagnosis Date  . Depression   . Dysmenorrhea   . Endometriosis   . GERD (gastroesophageal reflux disease)   . Herpes genitalis   . IBS (irritable bowel syndrome)   . Obesity   . Ovarian cyst 1998    right  . Bipolar affective disorder ([place])   . Hypertension   . History of nuclear stress test     a. 08/2015: no EKG changes concerning for ischemia, normal wall motion, EF >60%, low risk study  . Anxiety     a. per epic care everywhere & patient  . Hemorrhoid   . Sinus infection     Past Surgical History  Procedure Laterality Date  . Ovarian cyst removal    . Urethral dilation    . Upper gi endoscopy  2015    Dr Allen Norris    Family History  Problem Relation Age of Onset  . Arrhythmia Mother   . Hypertension Maternal Grandmother   . Hypertension Maternal Grandfather   . Heart disease Paternal  Grandmother   . Heart attack Paternal Grandmother   . Heart failure Maternal Aunt   . Heart disease Maternal Aunt     CABG    Social History Social History  Substance Use Topics  . Smoking status: Never Smoker   . Smokeless tobacco: Never Used  . Alcohol Use: 0.0 oz/week    0 Standard drinks or equivalent per week     Comment: rarely    Allergies  Allergen Reactions  . Cephalosporins Rash  . Omnicef [Cefdinir]     Legs itch  . Other Other (See Comments)    Uncoded Allergy. Allergen: amiceft, Other Reaction: Not Assessed  . Penicillins Other (See Comments)    Yeast  Rash     Current Outpatient Prescriptions  Medication Sig Dispense Refill  . carbamazepine (TEGRETOL) 200 MG tablet Take 200 mg by mouth 3 (three) times daily.     . clonazePAM (KLONOPIN) 1 MG tablet Take 1 mg by mouth 3 (three) times daily as needed.     Marland Kitchen dexlansoprazole (DEXILANT) 60 MG capsule Take 1 capsule (60 mg total) by mouth daily. 90 capsule 3  . furosemide (LASIX) 20 MG tablet Take 20 mg by mouth as directed.    Marland Kitchen losartan-hydrochlorothiazide (  HYZAAR) 50-12.5 MG per tablet Take 1 tablet by mouth daily. 90 tablet 3  . naproxen sodium (ANAPROX) 550 MG tablet Take 550 mg by mouth 2 (two) times daily with a meal.     . potassium chloride SA (K-DUR,KLOR-CON) 20 MEQ tablet Take 1 tablet (20 mEq total) by mouth 2 (two) times a week. Take on Saturday and Sunday only 24 tablet 3  . triamcinolone (NASACORT) 55 MCG/ACT AERO nasal inhaler Place 2 sprays into the nose daily.    . valACYclovir (VALTREX) 500 MG tablet Take 500 mg by mouth 3 (three) times daily as needed.      No current facility-administered medications for this visit.    Review of Systems Review of Systems  Constitutional: Negative.  Negative for unexpected weight change.  HENT: Negative.   Eyes: Negative.   Respiratory: Positive for shortness of breath.   Cardiovascular: Positive for leg swelling.  Endocrine: Negative.   Genitourinary:  Negative.   Allergic/Immunologic: Negative.   Neurological: Negative.   Hematological: Negative.   Psychiatric/Behavioral: Negative.     Blood pressure 138/82, pulse 66, resp. rate 14, height 5\' 4"  (1.626 m), weight 236 lb (107.049 kg), last menstrual period 09/29/2015.  Physical Exam Physical Exam  Constitutional: She is oriented to person, place, and time. She appears well-developed and well-nourished.  HENT:  Mouth/Throat: Oropharynx is clear and moist.  Eyes: Conjunctivae are normal. No scleral icterus.  Neck: Neck supple.  Cardiovascular: Normal rate, regular rhythm and normal heart sounds.   Minimal if any lower extremity edema is noted on today's exam.  Pulmonary/Chest: Effort normal and breath sounds normal.  Abdominal: Soft. Normal appearance and bowel sounds are normal. There is no tenderness. No hernia.  Lymphadenopathy:    She has no cervical adenopathy.  Neurological: She is alert and oriented to person, place, and time.  Skin: Skin is warm and dry.  Psychiatric: Her behavior is normal.    Data Reviewed Office notes from April 2000 were reviewed. Patient weight at that time: 211 pounds. Seen for permanent lower abdominal pain. Ultrasound at that time was normal. HIDA scan showed decreased ejection fraction with some symptoms during CCK infusion. Symptoms are not compatible with imaging at that time and cholecystectomy was not offered.  Cardiology notes from April-May 2016 reviewed including echocardiogram. Marked dietary indiscretion noted. Normal echo. Symptomatic fluid management with HCTZ/Lasix. (Patient reports she cannot use Lasix at work due to the distance between her workstation and the restroom, restricts Lasix to weekends).   Pulmonary consultation from Dr. Vella Kohler, fall 2016 reviewed. Near normal PFTs. No bronchospasm. Normal diaphragmatic motion on testing. Marland Kitchen [facility] study: EXAMDarcella Gasman SWALLOW  DATE: 07/28/15 13:40:07  ACCESSION: [id number] UN   DICTATED: 07/28/15 13:43:50  INTERPRETATION LOCATION: [place]    CLINICAL INDICATION: 44 Year Old female with history of chest pain, dyspnea, and heartburn for the past several months. EGD 01/2014 showed evidence of reflux esophagitis.    TECHNIQUE: Double contrast barium swallow was performed. The patient was given fizzies followed by oral contrast to swallow and observed fluoroscopically.Videofluoroscopy and spot films of the thoracic and cervical esophagus were obtained.    Fluoroscopy time was 2.3 minutes using a low dose system with pulsed fluoroscopy at 7.5 frames per second.     COMPARISON: None.    FINDINGS:   The patient was given fizzies follow by oral contrast to swallow which the patient did without any difficulty.     The esophagus was normal in caliber and appearance, without  any mucosal lesions or strictures noted. Peristalsis was normal in the upper and mid esophagus. Mild distal esophageal tertiary contractions were noted.    There was no evidence of a hiatal hernia, esophageal ring, web or achalasia. Gastroesophageal reflux to the lower thoracic esophagus was initially elicited with cough and valsalva maneuvers, however this was not reproduced on subsequent attempts.     The patient was given a 12.5 mm barium tablet which passed without difficulty.     Evaluation of the cervical esophagus with videofluoroscopy and rapid filming was unremarkable.    Final images show contrast within the stomach, normally rotated duodenum, and proximal small bowel.    IMPRESSION:      - Mild distal esophageal dysmotility with mild gastroesophageal reflux.   - No evidence of hiatal hernia.    Esophageal manometry 08/07/2015 completed at [facility]:   Impression:- Control of the distal esophageal acid was excellent.   - Control of the gastric acid was good.   - Normal absolute number of gastroesophageal reflux  events    on PPI once daily.   - Positive symptom correlation for nausea, regurgitation    and throat clearing (minimal events reported).   - Negative symptom correlation for all other symptoms.  Recommendation:- Return to referring physician.    Assessment    Atypical abdominal pain, unlikely biliary source...  Multiple medical comorbidities.  Psychiatric overlay.    Plan      The majority of the 45 minute evaluation was spent reviewing records both brought by the patient and on the EMR. Her workup today has shown no "smoking gun" as a source for discomfort. She readily admits to gross dietary indiscretion resulting in weight gain (up 20 pounds from 2000) as well as ingesting foods that cause her distress.  We'll reinvestigate the biliary tree with both ultrasound and likely HIDA scan/CCK. Further follow-up will take place based on the results of these studies.    Abdominal Ultrasound and if necessary a HIDA scan.  Patient is scheduled for an ultrasound at [facility] on 10/10/15 at 9:00 am. She will arrive at 8:45 am and have nothing to eat or drink after midnight. Patient is aware of date, time, and instructions.   PCP:  No Pcp   Robert Bellow 10/04/2015, 5:52 AM

## 2015-10-03 NOTE — Patient Instructions (Addendum)
The patient is aware to call back for any questions or concerns.  Abdominal Ultrasound and if necessary a HIDA scan.  Patient is scheduled for an ultrasound at [facility] on 10/10/15 at 9:00 am. She will arrive at 8:45 am and have nothing to eat or drink after midnight. Patient is aware of date, time, and instructions.

## 2015-10-04 DIAGNOSIS — R1013 Epigastric pain: Secondary | ICD-10-CM | POA: Insufficient documentation

## 2015-10-09 ENCOUNTER — Telehealth: Payer: Self-pay

## 2015-10-09 NOTE — Telephone Encounter (Signed)
Spoke w/ pt.  She reports that she took torsemide 20 mg over the weekend w/ K+.  Reports weakness since Saturday, after 4 lb wt loss from fluid. She has a wrist BP cuff that she states is inaccurate, will not tell me the readings that she got, only that they were low.  Reports that her whole body is cold & clammy, her usual chest pressure is described as pain today.  Reports SOB, denies n/v.  Reports her HR is consistently > 100, running about 115 today. Denies palpitations, but states that her heart beats hard at night.  Pt admits that her diet consists of mostly fried foods and carbs. She had a biscuit for breakfast this am.  Pt has not tried eliminating foods to see if she feels better.  She saw Dr. Bary Castilla last week, is sched for abdominal u/s tomorrow to evaluate gallbladder. Pt has a meeting today that the cannot miss, would like to know what can be done as far as her fatigue. Pt has flat affect during our conversation; reports that she has had tests involving GI, lungs, heart and that "nobody can figure out what's wrong".  Discussed w/ Dr. Rockey Situ.  He reviewed pt's chart, including pt's recent ECHO & myoview. Advised that pt stop taking torsemide as prescribed, only take on a prn basis.  As pt's chest pain is not reproducible, pt has few risk factors and all cardiac testing is normal, advised pt that sx are most likely GI related.  Advised her to take TUMS, Mylanta, or try a carbonated beverage in hopes of relieving potential stomach pressure.  Advised her to keep appt for u/s tomorrow.  Discussed w/ pt her dietary choices and advised her to try to avoid fried foods and carbs.  She is appreciative and will call back if we can be of further assistance.

## 2015-10-09 NOTE — Telephone Encounter (Signed)
Pt states she started taking the Torsemide on Saturday, states she dropped 4 pounds in fluid. States she starting feeling very dizzy, almost passed out. States she has difficulty standing up . States today she started having chest pressure. Pt c/o of Chest Pain: STAT if CP now or developed within 24 hours  1. Are you having CP right now? No, it was really bad on Friday, pain started today  2. Are you experiencing any other symptoms (ex. SOB, nausea, vomiting, sweating)? Weakness, clammy, cold  3. How long have you been experiencing CP? Pain, today, Friday, was just pressure  4. Is your CP continuous or coming and going? Friday 5-6 hours straight, pain today when she takes deep breaths.  5. Have you taken Nitroglycerin? no ?  Please call.

## 2015-10-10 ENCOUNTER — Telehealth: Payer: Self-pay

## 2015-10-10 ENCOUNTER — Ambulatory Visit
Admission: RE | Admit: 2015-10-10 | Discharge: 2015-10-10 | Disposition: A | Discharge status: Home / Self Care | Payer: 59 | Source: Ambulatory Visit | Attending: General Surgery | Admitting: General Surgery

## 2015-10-10 DIAGNOSIS — R1013 Epigastric pain: Secondary | ICD-10-CM | POA: Diagnosis present

## 2015-10-10 DIAGNOSIS — K8020 Calculus of gallbladder without cholecystitis without obstruction: Secondary | ICD-10-CM | POA: Insufficient documentation

## 2015-10-10 DIAGNOSIS — K769 Liver disease, unspecified: Secondary | ICD-10-CM | POA: Insufficient documentation

## 2015-10-10 NOTE — Telephone Encounter (Signed)
-----   Message from Robert Bellow, MD sent at 10/10/2015 12:07 PM EST ----- Please notify the patient that the recently completed ultrasound was reviewed and indeed she has developed a gallstone since her last evaluation. If she would like to proceed to cholecystectomy this can be scheduled at her convenience. She should probably come in for an appointment prior to the procedure to review expected outcomes. Scheduling slip in your box. ----- Message -----    From: Rad Results In Interface    Sent: 10/10/2015  11:48 AM      To: Robert Bellow, MD

## 2015-10-11 ENCOUNTER — Telehealth: Payer: Self-pay | Admitting: *Deleted

## 2015-10-11 ENCOUNTER — Telehealth: Payer: Self-pay

## 2015-10-11 NOTE — Telephone Encounter (Signed)
Looks like pt is still undecided on whether or not she will proceed w/ surgery.  She is to have pre-op appt if she decides to go ahead. Will await cardiac clearance in the event that she decides w/ cholescystectomy.

## 2015-10-11 NOTE — Telephone Encounter (Signed)
-----   Message from Robert Bellow, MD sent at 10/10/2015 12:07 PM EST ----- Please notify the patient that the recently completed ultrasound was reviewed and indeed she has developed a gallstone since her last evaluation. If she would like to proceed to cholecystectomy this can be scheduled at her convenience. She should probably come in for an appointment prior to the procedure to review expected outcomes. Scheduling slip in your box. ----- Message -----    From: Rad Results In Interface    Sent: 10/10/2015  11:48 AM      To: Robert Bellow, MD

## 2015-10-11 NOTE — Telephone Encounter (Signed)
Pt called, wanted to let us know that she did have her u/s and she does have a gall stone. She wants to know that we are ok with her to proceed with her pre op appt with Dr. Fleet Contras. Also states she had CP last night. States she is till having the pain as of now.  Please call.

## 2015-10-11 NOTE — Telephone Encounter (Signed)
Patient called the office back today and was notified of results accordingly. She verbalizes understanding.   This patient states she is still having breathing difficulties and pain which radiates to her back. Also, concerned that surgery will not improve her symptoms. She is hesitate to schedule surgery at this time due to her work schedule. Patient will notify the office how she would like to proceed.   Patient aware she will require a pre-op appointment if she desires to proceed with surgery.

## 2015-10-13 NOTE — Telephone Encounter (Signed)
Pt states she may need a catherization

## 2015-10-13 NOTE — Telephone Encounter (Signed)
I have reviewed her stress test and echocardiogram Also reviewed images from her chest CT scan There is no significant coronary disease.  Chest pain symptoms are not from blockage Would proceed with gallbladder surgery  No further testing needed, acceptable risk

## 2015-10-13 NOTE — Telephone Encounter (Signed)
She would like Dr. Donivan Scull opinion on whether or not she should proceed w/ cholecystectomy.

## 2015-10-13 NOTE — Telephone Encounter (Signed)
Spoke w/ pt.  Advised her of Dr. Donivan Scull recommendation.  She is appreciative of the call and will meet w/ Dr. Bary Castilla on 11/28 for pre-op appt.

## 2015-10-13 NOTE — Telephone Encounter (Signed)
Pt called back, still awaiting a response regarding proceeding with her appt with Dr. Fleet Contras. Please advise

## 2015-10-16 ENCOUNTER — Ambulatory Visit (INDEPENDENT_AMBULATORY_CARE_PROVIDER_SITE_OTHER): Payer: 59 | Admitting: Internal Medicine

## 2015-10-16 ENCOUNTER — Other Ambulatory Visit: Payer: Self-pay | Admitting: Internal Medicine

## 2015-10-16 ENCOUNTER — Encounter: Payer: Self-pay | Admitting: Internal Medicine

## 2015-10-16 VITALS — BP 126/72 | HR 104 | Ht 64.0 in | Wt 240.2 lb

## 2015-10-16 DIAGNOSIS — R918 Other nonspecific abnormal finding of lung field: Secondary | ICD-10-CM | POA: Diagnosis not present

## 2015-10-16 DIAGNOSIS — R938 Abnormal findings on diagnostic imaging of other specified body structures: Secondary | ICD-10-CM | POA: Diagnosis not present

## 2015-10-16 DIAGNOSIS — R9389 Abnormal findings on diagnostic imaging of other specified body structures: Secondary | ICD-10-CM

## 2015-10-16 NOTE — Patient Instructions (Signed)
Pulmonary Nodular opacity A pulmonary nodule is a small, round growth of tissue in the lung. Pulmonary nodules can range in size from less than 1/5 inch (4 mm) to a little bigger than an inch (25 mm). Most pulmonary nodules are detected when imaging tests of the lung are being performed for a different problem. Pulmonary nodules are usually not cancerous (benign). However, some pulmonary nodules are cancerous (malignant). Follow-up treatment or testing is based on the size of the pulmonary nodule and your risk of getting lung cancer.  CAUSES Benign pulmonary nodules can be caused by various things. Some of the causes include:   Bacterial, fungal, or viral infections. This is usually an old infection that is no longer active, but it can sometimes be a current, active infection.  A benign mass of tissue.  Inflammation from conditions such as rheumatoid arthritis.   Abnormal blood vessels in the lungs. Malignant pulmonary nodules can result from lung cancer or from cancers that spread to the lung from other places in the body. SIGNS AND SYMPTOMS Pulmonary nodules usually do not cause symptoms. DIAGNOSIS Most often, pulmonary nodules are found incidentally when an X-ray or CT scan is performed to look for some other problem in the lung area. To help determine whether a pulmonary nodule is benign or malignant, your health care provider will take a medical history and order a variety of tests. Tests done may include:   Blood tests.  A skin test called a tuberculin test. This test is used to determine if you have been exposed to the germ that causes tuberculosis.   Chest X-rays. If possible, a new X-ray may be compared with X-rays you have had in the past.   CT scan. This test shows smaller pulmonary nodules more clearly than an X-ray.   Positron emission tomography (PET) scan. In this test, a safe amount of a radioactive substance is injected into the bloodstream. Then, the scan takes a  picture of the pulmonary nodule. The radioactive substance is eliminated from your body in your urine.   Biopsy. A tiny piece of the pulmonary nodule is removed so it can be checked under a microscope. TREATMENT  Pulmonary nodules that are benign normally do not require any treatment because they usually do not cause symptoms or breathing problems. Your health care provider may want to monitor the pulmonary nodule through follow-up CT scans. The frequency of these CT scans will vary based on the size of the nodule and the risk factors for lung cancer. For example, CT scans will need to be done more frequently if the pulmonary nodule is larger and if you have a history of smoking and a family history of cancer. Further testing or biopsies may be done if any follow-up CT scan shows that the size of the pulmonary nodule has increased. HOME CARE INSTRUCTIONS  Only take over-the-counter or prescription medicines as directed by your health care provider.  Keep all follow-up appointments with your health care provider. SEEK MEDICAL CARE IF:  You have trouble breathing when you are active.   You feel sick or unusually tired.   You do not feel like eating.   You lose weight without trying to.   You develop chills or night sweats.  SEEK IMMEDIATE MEDICAL CARE IF:  You cannot catch your breath, or you begin wheezing.   You cannot stop coughing.   You cough up blood.   You become dizzy or feel like you are going to pass out.  You have sudden chest pain.   You have a fever or persistent symptoms for more than 2-3 days.   You have a fever and your symptoms suddenly get worse. MAKE SURE YOU:  Understand these instructions.  Will watch your condition.  Will get help right away if you are not doing well or get worse.   This information is not intended to replace advice given to you by your health care provider. Make sure you discuss any questions you have with your health care  provider.   Document Released: 09/08/2009 Document Revised: 07/14/2013 Document Reviewed: 05/03/2013 Elsevier Interactive Patient Education [employer].

## 2015-10-16 NOTE — Progress Notes (Signed)
[facility] Consultation      Date: 10/16/2015,   MRN# [id number] Ashley Floyd 10-26-1971 Code Status:  Hosp day:@LENGTHOFSTAYDAYS @ Referring MD: @ATDPROV @     PCP:      AdmissionWeight: 240 lb 3.2 oz (108.954 kg)                 CurrentWeight: 240 lb 3.2 oz (108.954 kg) Ashley Floyd is a 44 y.o. old female seen in consultation for abnormal CTchest.     CHIEF COMPLAINT:   I have something in my lung   HISTORY OF PRESENT ILLNESS  44 yo white female seen today fro Left lower lobe opacity on CT chest approx 1 year ago, patient had health screening at work and was dx with HTN, patient with extensive cardiac work up by Dr Rockey Situ in the past severel months Since then patient has had abd pain and GB attacks and will undergo GB surgery next week for gallstones  Patient subsequently had acute chest discomfort and was dx with GERD. Patient had extensive GI work up Patient states that she can not lay flat otheriwse she will get SOB and Wheezy with her reflux symptoms  She has lower extremity swelling due to her medications  Patient had intermittant chest heaviness and SOB and underwent CT chest in 07/2015 CT chest Images reviewed with patient 10/16/2015   There is a left lower lobe opacity ground glass in nature approx 2 cm Patient has no signs or symptoms of infection at this time. She had seen a pulmonologist in the area. 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 Depression   . Dysmenorrhea   . Endometriosis    . GERD (gastroesophageal reflux disease)   . Herpes genitalis   . IBS (irritable bowel syndrome)   . Obesity   . Ovarian cyst 1998    right  . Bipolar affective disorder ([place])   . Hypertension   . History of nuclear stress test     a. 08/2015: no EKG changes concerning for ischemia, normal wall motion, EF >60%, low risk study  . Anxiety     a. per epic care everywhere & patient  . Hemorrhoid   . Sinus infection      SURGICAL HISTORY   Past Surgical History  Procedure Laterality Date  . Ovarian cyst removal    . Urethral dilation    . Upper gi endoscopy  2015    Dr Allen Norris     FAMILY HISTORY   Family History  Problem Relation Age of Onset  . Arrhythmia Mother   . Hypertension Maternal Grandmother   . Hypertension Maternal Grandfather   . Heart disease Paternal Grandmother   . Heart attack Paternal Grandmother   . Heart failure Maternal Aunt   . Heart disease Maternal Aunt     CABG     SOCIAL HISTORY   Social History  Substance Use Topics  . Smoking status: Never Smoker   .  Smokeless tobacco: Never Used  . Alcohol Use: 0.0 oz/week    0 Standard drinks or equivalent per week     Comment: rarely     MEDICATIONS    Home Medication:  Current Outpatient Rx  Name  Route  Sig  Dispense  Refill  . carbamazepine (TEGRETOL) 200 MG tablet   Oral   Take 200 mg by mouth 3 (three) times daily.          . clonazePAM (KLONOPIN) 1 MG tablet   Oral   Take 1 mg by mouth 3 (three) times daily as needed.          Marland Kitchen dexlansoprazole (DEXILANT) 60 MG capsule   Oral   Take 1 capsule (60 mg total) by mouth daily.   90 capsule   3   . furosemide (LASIX) 20 MG tablet   Oral   Take 20 mg by mouth as directed.         . naproxen sodium (ANAPROX) 550 MG tablet   Oral   Take 550 mg by mouth 2 (two) times daily with a meal.          . potassium chloride SA (K-DUR,KLOR-CON) 20 MEQ tablet   Oral   Take 1 tablet (20 mEq total) by mouth 2 (two) times a week. Take  on Saturday and Sunday only   24 tablet   3   . valACYclovir (VALTREX) 500 MG tablet   Oral   Take 500 mg by mouth 3 (three) times daily as needed.          Marland Kitchen losartan-hydrochlorothiazide (HYZAAR) 50-12.5 MG per tablet   Oral   Take 1 tablet by mouth daily.   90 tablet   3   . triamcinolone (NASACORT) 55 MCG/ACT AERO nasal inhaler   Nasal   Place 2 sprays into the nose daily.           Current Medication:  Current outpatient prescriptions:  .  carbamazepine (TEGRETOL) 200 MG tablet, Take 200 mg by mouth 3 (three) times daily. , Disp: , Rfl:  .  clonazePAM (KLONOPIN) 1 MG tablet, Take 1 mg by mouth 3 (three) times daily as needed. , Disp: , Rfl:  .  dexlansoprazole (DEXILANT) 60 MG capsule, Take 1 capsule (60 mg total) by mouth daily., Disp: 90 capsule, Rfl: 3 .  furosemide (LASIX) 20 MG tablet, Take 20 mg by mouth as directed., Disp: , Rfl:  .  naproxen sodium (ANAPROX) 550 MG tablet, Take 550 mg by mouth 2 (two) times daily with a meal. , Disp: , Rfl:  .  potassium chloride SA (K-DUR,KLOR-CON) 20 MEQ tablet, Take 1 tablet (20 mEq total) by mouth 2 (two) times a week. Take on Saturday and Sunday only, Disp: 24 tablet, Rfl: 3 .  valACYclovir (VALTREX) 500 MG tablet, Take 500 mg by mouth 3 (three) times daily as needed. , Disp: , Rfl:  .  losartan-hydrochlorothiazide (HYZAAR) 50-12.5 MG per tablet, Take 1 tablet by mouth daily., Disp: 90 tablet, Rfl: 3 .  triamcinolone (NASACORT) 55 MCG/ACT AERO nasal inhaler, Place 2 sprays into the nose daily., Disp: , Rfl:     ALLERGIES   Cephalosporins; Omnicef; Other; and Penicillins     REVIEW OF SYSTEMS   Review of Systems  Constitutional: Positive for malaise/fatigue. Negative for fever, chills, weight loss and diaphoresis.  HENT: Negative for hearing loss.   Eyes: Negative for blurred vision and double vision.  Respiratory: Positive for shortness of breath. Negative for  cough, sputum production and wheezing.     Cardiovascular: Positive for leg swelling. Negative for chest pain, palpitations and orthopnea.  Gastrointestinal: Positive for heartburn and abdominal pain. Negative for diarrhea, constipation and blood in stool.  Musculoskeletal: Negative for back pain and neck pain.  Skin: Negative for itching and rash.  Neurological: Negative for dizziness, tingling, weakness and headaches.  Psychiatric/Behavioral: The patient is nervous/anxious.      VS: BP 126/72 mmHg  Pulse 104  Ht 5\' 4"  (1.626 m)  Wt 240 lb 3.2 oz (108.954 kg)  BMI 41.21 kg/m2  SpO2 95%  LMP 09/29/2015     PHYSICAL EXAM  Physical Exam  Constitutional: She is oriented to person, place, and time. She appears well-developed and well-nourished. No distress.  HENT:  Head: Normocephalic and atraumatic.  Mouth/Throat: No oropharyngeal exudate.  Eyes: EOM are normal. Pupils are equal, round, and reactive to light.  Neck: Normal range of motion. Neck supple.  Cardiovascular: Normal rate, regular rhythm and normal heart sounds.   No murmur heard. Pulmonary/Chest: No stridor. No respiratory distress. She has no wheezes. She has no rales.  Abdominal: Soft. Bowel sounds are normal. She exhibits no distension. There is no tenderness. There is no rebound.  Musculoskeletal: Normal range of motion. She exhibits no edema.  Neurological: She is alert and oriented to person, place, and time.  Skin: Skin is warm. She is not diaphoretic.  Psychiatric: She has a normal mood and affect.             IMAGING    US Abdomen Limited Ruq  10/10/2015  CLINICAL DATA:  Fourteen months of epigastric pain EXAM: US ABDOMEN LIMITED - RIGHT UPPER QUADRANT COMPARISON:  Report of an abdominal ultrasound of January 29, 1999. FINDINGS: Gallbladder: There is an echogenic focus that is apparently impacted in the gallbladder neck measuring 1.4 cm. This is consistent with a stone. There is no gallbladder wall thickening, pericholecystic fluid, or positive  sonographic Murphy's sign. Common bile duct: Diameter: 4 mm Liver: The hepatic parenchyma was difficult to penetrate with the ultrasound beam due date increased echotexture. No discrete mass was observed. Areas of presumed focal fatty sparing were demonstrated. IMPRESSION: 1. A gallstone appears to be impacted in the gallbladder neck. There is no evidence of acute cholecystitis. 2. Increased hepatic echotexture with areas of decreased echotexture likely reflecting fatty infiltration with areas of focal fatty sparing. Correlation with the patient's liver function studies is needed. Hepatic protocol MRI is recommended if the LFTs are abnormal. Electronically Signed   By: David  Martinique M.D.   On: 10/10/2015 11:46        ASSESSMENT/PLAN   44 yo white female seen today for abnormal CT chest with Left lower lobe GGO/lung mass,  etiology includes infectious,inflammatory, or malignant. Etiology unclear at this time  At this time, will wait for repeat CT chest next month and will re-assess CT imaging If patient has persistent opacity, would highly recommend  Diagnostic evaluation  with either BRonch/ENB or FN biopsy or Surgical resection. Will follow up with patient after repeat CT chest   I have personally obtained a history, examined the patient, evaluated laboratory and independently reviewed imaging results, formulated the assessment and plan and placed orders.  The Patient requires high complexity decision making for assessment and support, frequent evaluation and titration of therapies, application of advanced monitoring technologies and extensive interpretation of multiple databases.   Patient is satisfied with Plan of action and management. All questions answered  Maretta Bees  Patricia Pesa, M.D.  [facility]  Medical Director [facility] [place] Director [facility] Cardio-Pulmonary Department

## 2015-10-23 ENCOUNTER — Ambulatory Visit (INDEPENDENT_AMBULATORY_CARE_PROVIDER_SITE_OTHER): Payer: 59 | Admitting: General Surgery

## 2015-10-23 ENCOUNTER — Encounter: Payer: Self-pay | Admitting: General Surgery

## 2015-10-23 DIAGNOSIS — K8020 Calculus of gallbladder without cholecystitis without obstruction: Secondary | ICD-10-CM | POA: Diagnosis not present

## 2015-10-23 NOTE — Progress Notes (Signed)
Patient ID: Ashley Floyd, female   DOB: [birth year], 44 y.o.   MRN: [id number]  Chief Complaint  Patient presents with  . Pre-op Exam    gallbladder     HPI Ashley Floyd is a 44 y.o. female here today to discuss her recently completed a gallbladder ultrasound. Her abdominal/chest/retrosternal symptoms were somewhat vague at the time of her evaluation earlier this month, but her recent ultrasound showed evidence of cholelithiasis with suggestion of an impacted stone. She continues to have episodic pain, worse when she eats offending foods such as greasy foods and sometimes salads.  She is accompanied today by her husband who was present for the interview and exam. Since her last visit she has been evaluated by the pulmonary service with plans for a three-month follow-up due to an area of haziness in the left lower lobe.  The patient continues to notice difficulty taking a deep breath, and trouble holding notes while singing.  The patient reports that after her laparoscopic exam at [facility] in 03-23-05 she had difficulty with what she described as fluttering in her chest. This was eventually thought to be secondary to reflux, aggravated by the Trendelenburg position utilized during the procedure. She has had episodic spells with her heart fluttering, but not as pronounced as that episode in 03/24/1999. Of note, it was relieved with the use of an antacid solution.  The patient reports she was first diagnosed with GERD by the ENT service. She's been managed on Prevacid, Dexilant and Prilosec. The latter was unhelpful.  She brought with her a height page of questions regarding the procedure risks and timing for surgery. She reports she is presently in the midst of a large product at work and involving several departments in [place] staff members and should like to defer surgical intervention until January.     HPI  Past Medical History  Diagnosis Date  . Depression   . Dysmenorrhea   . Endometriosis    . GERD (gastroesophageal reflux disease)   . Herpes genitalis   . IBS (irritable bowel syndrome)   . Obesity   . Ovarian cyst 1998    right  . Bipolar affective disorder ([place])   . Hypertension   . History of nuclear stress test     a. 08/2015: no EKG changes concerning for ischemia, normal wall motion, EF >60%, low risk study  . Anxiety     a. per epic care everywhere & patient  . Hemorrhoid   . Sinus infection     Past Surgical History  Procedure Laterality Date  . Ovarian cyst removal    . Urethral dilation    . Upper gi endoscopy  March 23, 2014    Dr Allen Norris    Family History  Problem Relation Age of Onset  . Arrhythmia Mother   . Hypertension Maternal Grandmother   . Hypertension Maternal Grandfather   . Heart disease Paternal Grandmother   . Heart attack Paternal Grandmother   . Heart failure Maternal Aunt   . Heart disease Maternal Aunt     CABG    Social History Social History  Substance Use Topics  . Smoking status: Never Smoker   . Smokeless tobacco: Never Used  . Alcohol Use: 0.0 oz/week    0 Standard drinks or equivalent per week     Comment: rarely    Allergies  Allergen Reactions  . Cephalosporins Rash  . Omnicef [Cefdinir]     Legs itch  . Other Other (See Comments)  Uncoded Allergy. Allergen: amiceft, Other Reaction: Not Assessed  . Penicillins Other (See Comments)    Yeast  Rash     Current Outpatient Prescriptions  Medication Sig Dispense Refill  . carbamazepine (TEGRETOL) 200 MG tablet Take 200 mg by mouth 3 (three) times daily.     . clonazePAM (KLONOPIN) 1 MG tablet Take 1 mg by mouth 3 (three) times daily as needed.     Marland Kitchen dexlansoprazole (DEXILANT) 60 MG capsule Take 1 capsule (60 mg total) by mouth daily. 90 capsule 3  . furosemide (LASIX) 20 MG tablet Take 20 mg by mouth as directed.    Marland Kitchen losartan-hydrochlorothiazide (HYZAAR) 50-12.5 MG per tablet Take 1 tablet by mouth daily. 90 tablet 3  . naproxen sodium (ANAPROX) 550 MG tablet Take  550 mg by mouth 2 (two) times daily with a meal.     . potassium chloride SA (K-DUR,KLOR-CON) 20 MEQ tablet Take 1 tablet (20 mEq total) by mouth 2 (two) times a week. Take on Saturday and Sunday only 24 tablet 3  . triamcinolone (NASACORT) 55 MCG/ACT AERO nasal inhaler Place 2 sprays into the nose daily.    . valACYclovir (VALTREX) 500 MG tablet Take 500 mg by mouth 3 (three) times daily as needed.      No current facility-administered medications for this visit.    Review of Systems Review of Systems  Constitutional: Negative.   Respiratory: Negative.   Cardiovascular: Negative.     Blood pressure 144/78, pulse 74, resp. rate 14, height 5\' 4"  (1.626 m), weight 242 lb (109.77 kg), last menstrual period 09/29/2015.  Physical Exam Physical Exam  Constitutional: She is oriented to person, place, and time. She appears well-developed and well-nourished.  Eyes: Conjunctivae are normal. No scleral icterus.  Neck: Neck supple.  Cardiovascular: Normal rate, regular rhythm and normal heart sounds.   Pulmonary/Chest: Effort normal and breath sounds normal.  Abdominal: Soft. Normal appearance and bowel sounds are normal. There is no tenderness.  Lymphadenopathy:    She has no cervical adenopathy.  Neurological: She is alert and oriented to person, place, and time.  Skin: Skin is warm and dry.    Data Reviewed EXAM: US ABDOMEN LIMITED - RIGHT UPPER QUADRANT  COMPARISON: Report of an abdominal ultrasound of January 29, 1999.  FINDINGS: Gallbladder:  There is an echogenic focus that is apparently impacted in the gallbladder neck measuring 1.4 cm. This is consistent with a stone. There is no gallbladder wall thickening, pericholecystic fluid, or positive sonographic Murphy's sign.  Common bile duct:  Diameter: 4 mm  Liver:  The hepatic parenchyma was difficult to penetrate with the ultrasound beam due date increased echotexture. No discrete mass was observed. Areas of  presumed focal fatty sparing were demonstrated.  IMPRESSION: 1. A gallstone appears to be impacted in the gallbladder neck. There is no evidence of acute cholecystitis. 2. Increased hepatic echotexture with areas of decreased echotexture likely reflecting fatty infiltration with areas of focal fatty sparing. Correlation with the patient's liver function studies is needed. Hepatic protocol MRI is recommended if the LFTs are abnormal.   Assessment    Symptomatic cholelithiasis.  GERD.  Left lower lobe lung lesion, infectious versus malignancy, follow-up CT scheduled for December 2016    Plan    The patient was discouraged from eating offending foods prior to surgical intervention. Her list of questions were answered. We spent about 40 minutes going over this fairly extensive list with 1 major break for tearfulness when I asked her to  focus on each individual question rather than skipping from the top to the bottom of the page.  The procedure is unlikely to improve her reflux symptoms, and I would have difficulty explaining if she was noted to have better lung/breath control after surgery. As she is experiencing retrosternal infrascapular interval lesser extent right upper quadrant pain with fatty meals, I would dissipate these would all improve.  She will be in Trendelenburg position briefly at the beginning of the procedure, after that she'll be placed in reverse Trendelenburg position, hopefully minimizing any reflux during the procedure.    Laparoscopic Cholecystectomy with Intraoperative Cholangiogram. The procedure, including it's potential risks and complications (including but not limited to infection, bleeding, injury to intra-abdominal organs or bile ducts, bile leak, poor cosmetic result, sepsis and death) were discussed with the patient in detail. Non-operative options, including their inherent risks (acute calculous cholecystitis with possible choledocholithiasis or gallstone  pancreatitis, with the risk of ascending cholangitis, sepsis, and death) were discussed as well. The patient expressed and understanding of what we discussed and wishes to proceed with laparoscopic cholecystectomy. The patient further understands that if it is technically not possible, or it is unsafe to proceed laparoscopically, that I will convert to an open cholecystectomy.  Patient's surgery has been scheduled for 12-07-15 at [facility].  PCP:  No Pcp   Robert Bellow 10/23/2015, 5:03 PM

## 2015-10-23 NOTE — Patient Instructions (Addendum)
Laparoscopic Cholecystectomy Laparoscopic cholecystectomy is surgery to remove the gallbladder. The gallbladder is located in the upper right part of the abdomen, behind the liver. It is a storage sac for bile, which is produced in the liver. Bile aids in the digestion and absorption of fats. Cholecystectomy is often done for inflammation of the gallbladder (cholecystitis). This condition is usually caused by a buildup of gallstones (cholelithiasis) in the gallbladder. Gallstones can block the flow of bile, and that can result in inflammation and pain. In severe cases, emergency surgery may be required. If emergency surgery is not required, you will have time to prepare for the procedure. Laparoscopic surgery is an alternative to open surgery. Laparoscopic surgery has a shorter recovery time. Your common bile duct may also need to be examined during the procedure. If stones are found in the common bile duct, they may be removed. LET [facility] CARE PROVIDER KNOW ABOUT:  Any allergies you have.  All medicines you are taking, including vitamins, herbs, eye drops, creams, and over-the-counter medicines.  Previous problems you or members of your family have had with the use of anesthetics.  Any blood disorders you have.  Previous surgeries you have had.  Any medical conditions you have. RISKS AND COMPLICATIONS Generally, this is a safe procedure. However, problems may occur, including:  Infection.  Bleeding.  Allergic reactions to medicines.  Damage to other structures or organs.  A stone remaining in the common bile duct.  A bile leak from the cyst duct that is clipped when your gallbladder is removed.  The need to convert to open surgery, which requires a larger incision in the abdomen. This may be necessary if your surgeon thinks that it is not safe to continue with a laparoscopic procedure. BEFORE THE PROCEDURE  Ask your health care provider about:  Changing or stopping your  regular medicines. This is especially important if you are taking diabetes medicines or blood thinners.  Taking medicines such as aspirin and ibuprofen. These medicines can thin your blood. Do not take these medicines before your procedure if your health care provider instructs you not to.  Follow instructions from your health care provider about eating or drinking restrictions.  Let your health care provider know if you develop a cold or an infection before surgery.  Plan to have someone take you home after the procedure.  Ask your health care provider how your surgical site will be marked or identified.  You may be given antibiotic medicine to help prevent infection. PROCEDURE  To reduce your risk of infection:  Your health care team will wash or sanitize their hands.  Your skin will be washed with soap.  An IV tube may be inserted into one of your veins.  You will be given a medicine to make you fall asleep (general anesthetic).  A breathing tube will be placed in your mouth.  The surgeon will make several small cuts (incisions) in your abdomen.  A thin, lighted tube (laparoscope) that has a tiny camera on the end will be inserted through one of the small incisions. The camera on the laparoscope will send a picture to a TV screen (monitor) in the operating room. This will give the surgeon a good view inside your abdomen.  A gas will be pumped into your abdomen. This will expand your abdomen to give the surgeon more room to perform the surgery.  Other tools that are needed for the procedure will be inserted through the other incisions. The gallbladder will  be removed through one of the incisions.  After your gallbladder has been removed, the incisions will be closed with stitches (sutures), staples, or skin glue.  Your incisions may be covered with a bandage (dressing). The procedure may vary among health care providers and hospitals. AFTER THE PROCEDURE  Your blood  pressure, heart rate, breathing rate, and blood oxygen level will be monitored often until the medicines you were given have worn off.  You will be given medicines as needed to control your pain.   This information is not intended to replace advice given to you by your health care provider. Make sure you discuss any questions you have with your health care provider.   Document Released: 11/11/2005 Document Revised: 08/02/2015 Document Reviewed: 06/23/2013 Elsevier Interactive Patient Education 2016 [place].   Patient's surgery has been scheduled for 12-07-15 at [facility].

## 2015-10-26 ENCOUNTER — Ambulatory Visit (INDEPENDENT_AMBULATORY_CARE_PROVIDER_SITE_OTHER): Payer: 59 | Admitting: Cardiovascular Disease

## 2015-10-26 ENCOUNTER — Encounter: Payer: Self-pay | Admitting: Cardiovascular Disease

## 2015-10-26 VITALS — BP 128/83 | HR 106 | Ht 64.0 in | Wt 239.2 lb

## 2015-10-26 DIAGNOSIS — Z01810 Encounter for preprocedural cardiovascular examination: Secondary | ICD-10-CM | POA: Diagnosis not present

## 2015-10-26 DIAGNOSIS — R0602 Shortness of breath: Secondary | ICD-10-CM | POA: Diagnosis not present

## 2015-10-26 DIAGNOSIS — R918 Other nonspecific abnormal finding of lung field: Secondary | ICD-10-CM

## 2015-10-26 DIAGNOSIS — R0789 Other chest pain: Secondary | ICD-10-CM

## 2015-10-26 DIAGNOSIS — I10 Essential (primary) hypertension: Secondary | ICD-10-CM

## 2015-10-26 DIAGNOSIS — F419 Anxiety disorder, unspecified: Secondary | ICD-10-CM

## 2015-10-26 MED ORDER — PROPRANOLOL HCL 20 MG PO TABS: 20.0000 mg | ORAL_TABLET | Freq: Three times a day (TID) | ORAL | Status: DC | PRN

## 2015-10-26 NOTE — Patient Instructions (Signed)
You are doing well.  Try propranolol as needed for tachycardia Take 1/2 up to a whole  Please call us if you have new issues that need to be addressed before your next appt.

## 2015-10-26 NOTE — Assessment & Plan Note (Signed)
Normal echocardiogram and chest CT with no coronary artery disease. No further cardiac testing needed. Etiology of her shortness of breath is unclear, low BNP, less likely CHF though she certainly can take very low dose torsemide 10 mg when necessary for worsening symptoms.  Most likely shortness of breath is from deconditioning, weight, unable to exclude tachycardia. Heart rate elevated today. Recommended she take propranolol as needed for symptoms. She does not 1 beta blockers on a regular basis as she is concerned about propensity for diabetes.

## 2015-10-26 NOTE — Assessment & Plan Note (Signed)
We have encouraged continued exercise, careful diet management in an effort to lose weight. 

## 2015-10-26 NOTE — Assessment & Plan Note (Signed)
She would be acceptable risk for any procedures that she needs done including laparoscopic cholecystectomy or even lung surgery for nodule left upper lobe No further testing needed May benefit from benzodiazepines and beta blockers prior to procedure for anxiety and tachycardia

## 2015-10-26 NOTE — Assessment & Plan Note (Signed)
Very anxious given her recent medical findings in the lung and gallbladder. Work also very stressful for her.

## 2015-10-26 NOTE — Progress Notes (Signed)
Patient ID: Ashley Floyd, female    DOB: 11-Jan-1971, 44 y.o.   MRN: [id number]  HPI Comments: Ashley Floyd is a 44 year old woman with obesity, bipolar disorder, depression, previous primary care through [place] (no longer available), who presents for evaluation of her hypertension and chest tightness, shortness of breath. She reports weight gain, dietary indiscretion. Significant family history of diabetes.  In follow-up today, she has numerous new medical issues that have caused her considerable stress. Workup for shortness of breath and chest discomfort revealing left upper lobe 2 cm lung lesion Repeat CT scan has been ordered by Dr. Mortimer Fries for close observation. Unable to exclude neoplastic growth.  She had right upper quadrant ultrasound confirming gallstones. Seen by Dr. Bary Castilla and given her continued symptoms Chest discomfort, upper epigastric discomfort after eating, cholecystectomy was recommended. She has scheduled this for 12/07/2015   repeat CT scan scheduled 11/14/2015, visit with Dr. Mortimer Fries scheduled 11/14/2015 She does not know what to make of it all. Her medical findings plus work has been very stressful  She does report having continued shortness of breath on exertion also frequent periods of tachycardia.. Try to metoprolol in the past but did not want to stay on this or really started as she was concerned about the risk of metoprolol and diabetes  Heart rate on today's visit 109    she does report taking torsemide 20 mg and lost 4 pounds, felt washed out she is not really taken any further torsemide since that time  Other past medical history high blood pressure noted fall of 2015 , 141/100. Started on HCTZ 25 mg daily. Did not seem to help her blood pressure. changed to losartan HCTZ 50/12.5 mill grams daily. Blood pressure improved. She started having palpitations felt in her face, commonly at nighttime. Started on metoprolol 25 mg.  This helped both headaches and  palpitations. stopped out of concern for diabetes   weight gain, Drinks [facility] through the day, fast food, no exercise.  lab work October 2015 showing normal BMP, low vitamin D    Allergies  Allergen Reactions  . Cephalosporins Rash  . Omnicef [Cefdinir]     Legs itch  . Penicillins Other (See Comments)    Yeast  Rash     Outpatient Encounter Prescriptions as of 10/26/2015  Medication Sig  . carbamazepine (TEGRETOL) 200 MG tablet Take 200 mg by mouth 3 (three) times daily.   . clonazePAM (KLONOPIN) 1 MG tablet Take 1 mg by mouth 3 (three) times daily as needed.   Marland Kitchen dexlansoprazole (DEXILANT) 60 MG capsule Take 1 capsule (60 mg total) by mouth daily.  Marland Kitchen losartan-hydrochlorothiazide (HYZAAR) 50-12.5 MG per tablet Take 1 tablet by mouth daily.  . naproxen sodium (ANAPROX) 550 MG tablet Take 550 mg by mouth 2 (two) times daily with a meal.   . potassium chloride SA (K-DUR,KLOR-CON) 20 MEQ tablet Take 1 tablet (20 mEq total) by mouth 2 (two) times a week. Take on Saturday and Sunday only  . torsemide (DEMADEX) 20 MG tablet Take 10 mg by mouth as needed.  . triamcinolone (NASACORT) 55 MCG/ACT AERO nasal inhaler Place 2 sprays into the nose daily.  . valACYclovir (VALTREX) 500 MG tablet Take 500 mg by mouth 3 (three) times daily as needed.   . propranolol (INDERAL) 20 MG tablet Take 1 tablet (20 mg total) by mouth 3 (three) times daily as needed.  . [DISCONTINUED] furosemide (LASIX) 20 MG tablet Take 20 mg by mouth as directed.  No facility-administered encounter medications on file as of 10/26/2015.    Past Medical History  Diagnosis Date  . Depression   . Dysmenorrhea   . Endometriosis   . GERD (gastroesophageal reflux disease)   . Herpes genitalis   . IBS (irritable bowel syndrome)   . Obesity   . Ovarian cyst 1998    right  . Bipolar affective disorder ([place])   . Hypertension   . History of nuclear stress test     a. 08/2015: no EKG changes concerning for ischemia,  normal wall motion, EF >60%, low risk study  . Anxiety     a. per epic care everywhere & patient  . Hemorrhoid   . Sinus infection     Past Surgical History  Procedure Laterality Date  . Ovarian cyst removal    . Urethral dilation    . Upper gi endoscopy  2015    Dr Allen Norris    Social History  reports that she has never smoked. She has never used smokeless tobacco. She reports that she drinks alcohol. She reports that she does not use illicit drugs.  Family History family history includes Arrhythmia in her mother; Heart attack in her paternal grandmother; Heart disease in her maternal aunt and paternal grandmother; Heart failure in her maternal aunt; Hypertension in her maternal grandfather and maternal grandmother.  Review of Systems  Constitutional: Negative.   Respiratory: Positive for shortness of breath.   Cardiovascular: Negative.   Gastrointestinal: Negative.   Musculoskeletal: Positive for arthralgias.  Skin: Negative.   Neurological: Negative.   Hematological: Negative.   Psychiatric/Behavioral: The patient is nervous/anxious.   All other systems reviewed and are negative.   BP 128/83 mmHg  Pulse 106  Ht 5\' 4"  (1.626 m)  Wt 239 lb 4 oz (108.523 kg)  BMI 41.05 kg/m2  LMP 09/29/2015  Physical Exam  Constitutional: She is oriented to person, place, and time. She appears well-developed and well-nourished.  HENT:  Head: Normocephalic.  Nose: Nose normal.  Mouth/Throat: Oropharynx is clear and moist.  Eyes: Conjunctivae are normal. Pupils are equal, round, and reactive to light.  Neck: Normal range of motion. Neck supple. No JVD present.  Cardiovascular: Normal rate, regular rhythm, S1 normal, S2 normal, normal heart sounds and intact distal pulses.  Exam reveals no gallop and no friction rub.   No murmur heard. Pulmonary/Chest: Effort normal and breath sounds normal. No respiratory distress. She has no wheezes. She has no rales. She exhibits no tenderness.   Abdominal: Soft. Bowel sounds are normal. She exhibits no distension. There is no tenderness.  Musculoskeletal: Normal range of motion. She exhibits no edema or tenderness.  Lymphadenopathy:    She has no cervical adenopathy.  Neurological: She is alert and oriented to person, place, and time. Coordination normal.  Skin: Skin is warm and dry. No rash noted. No erythema.  Psychiatric: She has a normal mood and affect. Her behavior is normal. Judgment and thought content normal.    Assessment and Plan  Nursing note and vitals reviewed.

## 2015-10-26 NOTE — Assessment & Plan Note (Signed)
CT scan images reviewed with her. Being managed by pulmonary Repeat CT scan next month

## 2015-10-26 NOTE — Assessment & Plan Note (Signed)
Blood pressure is well controlled on today's visit. No changes made to the medications. 

## 2015-10-26 NOTE — Assessment & Plan Note (Signed)
cardiac workup unrevealing, no further testing needed CT scan with no significant coronary calcifications, normal echo Likely secondary to gallbladder disease

## 2015-10-27 ENCOUNTER — Telehealth: Payer: Self-pay

## 2015-10-27 NOTE — Telephone Encounter (Signed)
Left detailed message on pt's vm that Dr. Rockey Situ spoke w/ Dr. Haroldine Laws regarding a surgeon in [place]. Advised her that he recommends Dr. Serita Grammes or Dr. Christie Beckers at [facility]. Provided her w/ number to their office [phone]). Asked her to call back if we can be of further assistance.

## 2015-10-30 NOTE — Telephone Encounter (Signed)
Pt called stating that she attempted to make appt w/ [facility], but she will need a referral.  States that she does not have a PCP and she was told that we can make referral for her.  She asks that I fax referral and all pertinent office notes to Santiago Glad @ [phone].  Spoke w/ [facility].  She states that the MD that diagnosed pt's gallstones would need to make referral, as they will need their office notes and diagnostic results, which I cannot send.   Left pt message on work vm that she will need to contact Dr. Dwyane Luo office for referral. Asked her to call back if we can be of further assistance.

## 2015-11-14 ENCOUNTER — Ambulatory Visit
Admission: RE | Admit: 2015-11-14 | Discharge: 2015-11-14 | Disposition: A | Discharge status: Home / Self Care | Payer: 59 | Source: Ambulatory Visit | Attending: Internal Medicine | Admitting: Internal Medicine

## 2015-11-14 DIAGNOSIS — R938 Abnormal findings on diagnostic imaging of other specified body structures: Secondary | ICD-10-CM | POA: Diagnosis present

## 2015-11-14 DIAGNOSIS — R911 Solitary pulmonary nodule: Secondary | ICD-10-CM | POA: Diagnosis not present

## 2015-11-14 DIAGNOSIS — R9389 Abnormal findings on diagnostic imaging of other specified body structures: Secondary | ICD-10-CM

## 2015-11-16 ENCOUNTER — Ambulatory Visit (INDEPENDENT_AMBULATORY_CARE_PROVIDER_SITE_OTHER): Payer: 59 | Admitting: Internal Medicine

## 2015-11-16 ENCOUNTER — Encounter: Payer: Self-pay | Admitting: Internal Medicine

## 2015-11-16 VITALS — BP 144/76 | HR 87 | Ht 64.0 in | Wt 239.8 lb

## 2015-11-16 DIAGNOSIS — R0602 Shortness of breath: Secondary | ICD-10-CM | POA: Diagnosis not present

## 2015-11-16 NOTE — Patient Instructions (Signed)
SOB from Gallbladder disease  Follow up with Gen Surgery for GB surgery

## 2015-11-16 NOTE — Progress Notes (Signed)
[facility] Consultation      Date: 11/16/2015,   MRN# [id number] Ashley Floyd 01-Jan-1971 Code Status:  Hosp day:@LENGTHOFSTAYDAYS @ Referring MD: @ATDPROV @     PCP:      AdmissionWeight: 239 lb 12.8 oz (108.773 kg)                 CurrentWeight: 239 lb 12.8 oz (108.773 kg) Ashley PHILLIPPI is a 44 y.o. old female seen in consultation for abnormal CTchest.     CHIEF COMPLAINT:   follow up Ct chest   HISTORY OF PRESENT ILLNESS  CT images reviewed 11/14/15 images reviewed with patient 11/16/2015   Patient has no resp symptoms at this time The LLL GGO has resolved  Patient to have GB surgery next year     Current Medication:  Current outpatient prescriptions:  .  carbamazepine (TEGRETOL) 200 MG tablet, Take 200 mg by mouth 3 (three) times daily. , Disp: , Rfl:  .  clonazePAM (KLONOPIN) 1 MG tablet, Take 1 mg by mouth 3 (three) times daily as needed. , Disp: , Rfl:  .  dexlansoprazole (DEXILANT) 60 MG capsule, Take 1 capsule (60 mg total) by mouth daily., Disp: 90 capsule, Rfl: 3 .  losartan-hydrochlorothiazide (HYZAAR) 50-12.5 MG per tablet, Take 1 tablet by mouth daily., Disp: 90 tablet, Rfl: 3 .  naproxen sodium (ANAPROX) 550 MG tablet, Take 550 mg by mouth 2 (two) times daily with a meal. , Disp: , Rfl:  .  potassium chloride SA (K-DUR,KLOR-CON) 20 MEQ tablet, Take 1 tablet (20 mEq total) by mouth 2 (two) times a week. Take on Saturday and Sunday only, Disp: 24 tablet, Rfl: 3 .  propranolol (INDERAL) 20 MG tablet, Take 1 tablet (20 mg total) by mouth 3 (three) times daily as needed., Disp: 90 tablet, Rfl: 3 .  torsemide (DEMADEX) 20 MG tablet, Take 10 mg by mouth as needed., Disp: , Rfl:  .  triamcinolone (NASACORT) 55 MCG/ACT AERO nasal inhaler, Place 2 sprays into the nose daily., Disp: , Rfl:  .  valACYclovir (VALTREX) 500 MG tablet, Take 500 mg by mouth 3 (three) times daily as needed. , Disp: , Rfl:     ALLERGIES   Cephalosporins; Omnicef; and  Penicillins     REVIEW OF SYSTEMS   Review of Systems  Constitutional: Negative for fever, chills, weight loss and malaise/fatigue.  Respiratory: Negative for cough, hemoptysis, sputum production, shortness of breath and wheezing.   Cardiovascular: Positive for leg swelling. Negative for chest pain, palpitations and orthopnea.  All other systems reviewed and are negative.    VS: BP 144/76 mmHg  Pulse 87  Ht 5\' 4"  (1.626 m)  Wt 239 lb 12.8 oz (108.773 kg)  BMI 41.14 kg/m2  SpO2 91%     PHYSICAL EXAM  Physical Exam  Constitutional: She appears well-developed and well-nourished. No distress.  Eyes: EOM are normal.  Cardiovascular: Normal rate, regular rhythm and normal heart sounds.   No murmur heard. Pulmonary/Chest: No stridor.  Abdominal: Soft.  Musculoskeletal: Normal range of motion. She exhibits edema.  Skin: She is not diaphoretic.             IMAGING    Ct Chest Wo Contrast  11/14/2015  CLINICAL DATA:  Followup sub solid nodule. EXAM: CT CHEST WITHOUT CONTRAST TECHNIQUE: Multidetector CT imaging of the chest was performed following the standard protocol without IV contrast. COMPARISON:  08/18/2015 FINDINGS: Mediastinum: The heart size appears normal. No pericardial effusion. No mediastinal or hilar  adenopathy. The trachea appears patent and is midline. Normal appearance of the esophagus. Lungs/Pleura: No pleural fluid identified. There has been interval resolution of ground-glass attenuating nodule in the left lower lobe compatible with a benign process. Likely post infectious or inflammatory. Solid nodule in the right middle lobe is unchanged measuring 3 mm, image 33 of series 3. No new pulmonary nodules or masses identified. Upper Abdomen: No focal liver abnormalities identified. The pancreas is normal. Calcified granulomas identified within the spleen. The adrenal glands are normal. Musculoskeletal: Mild spondylosis present within the thoracic spine. No  aggressive lytic or sclerotic bone lesions. IMPRESSION: 1. Interval resolution of left lung sub solid nodule compatible with inflammatory or infectious process. 2. Persistent solid, If the patient is at high risk for bronchogenic carcinoma, follow-up chest CT at 1year is recommended. If the patient is at low risk, no follow-up is needed. This recommendation follows the consensus statement: Guidelines for Management of Small Pulmonary Nodules Detected on CT Scans: A Statement from the [place] as published in Radiology 2005; 237:395-400. 3 mm right middle lobe lung nodule. Electronically Signed   By: Kerby Moors M.D.   On: 11/14/2015 15:57        ASSESSMENT/PLAN   44 yo white female seen today for abnormal CT chest with Left lower lobe GGO etiology includes  Infectious,inflammatory. No evidence of Malignancy.  Patient has intermittent SOB from GB disease with intermittent abd pain/discomfort Patient to have GB surgery some time next year  Follow up in 6 months   I have personally obtained a history, examined the patient, evaluated laboratory and independently reviewed imaging results, formulated the assessment and plan and placed orders.  The Patient requires high complexity decision making for assessment and support, frequent evaluation and titration of therapies, application of advanced monitoring technologies and extensive interpretation of multiple databases.   Patient/Family are satisfied with Plan of action and management. All questions answered  Corrin Parker, M.D.  [facility]  Medical Director [place] Director [facility] Department

## 2015-11-23 ENCOUNTER — Ambulatory Visit: Payer: 59

## 2015-11-30 ENCOUNTER — Inpatient Hospital Stay: Admission: RE | Admit: 2015-11-30 | Payer: 59 | Source: Ambulatory Visit

## 2015-11-30 ENCOUNTER — Encounter: Payer: Self-pay | Admitting: *Deleted

## 2015-11-30 NOTE — Patient Instructions (Signed)
  Your procedure is scheduled on: 12-07-15 Report to [place] To find out your arrival time please call [phone] between 1PM - 3PM on 12-06-15  Remember: Instructions that are not followed completely may result in serious medical risk, up to and including death, or upon the discretion of your surgeon and anesthesiologist your surgery may need to be rescheduled.    _X___ 1. Do not eat food or drink liquids after midnight. No gum chewing or hard candies.     _X___ 2. No Alcohol for 24 hours before or after surgery.   ____ 3. Bring all medications with you on the day of surgery if instructed.    _X___ 4. Notify your doctor if there is any change in your medical condition     (cold, fever, infections).     Do not wear jewelry, make-up, hairpins, clips or nail polish.  Do not wear lotions, powders, or perfumes. You may wear deodorant.  Do not shave 48 hours prior to surgery. Men may shave face and neck.  Do not bring valuables to the hospital.    [facility] is not responsible for any belongings or valuables.               Contacts, dentures or bridgework may not be worn into surgery.  Leave your suitcase in the car. After surgery it may be brought to your room.  For patients admitted to the hospital, discharge time is determined by your treatment team.   Patients discharged the day of surgery will not be allowed to drive home.   Please read over the following fact sheets that you were given:      _X___ Take these medicines the morning of surgery with A SIP OF WATER:    1. DEXILANT  2. BRING YOUR PROPRANOLOL WITH YOU TO [facility]  3.   4.  5.  6.  ____ Fleet Enema (as directed)   ____ Use CHG Soap as directed  ____ Use inhalers on the day of surgery  ____ Stop metformin 2 days prior to surgery    ____ Take 1/2 of usual insulin dose the night before surgery and none on the morning of surgery.   ____ Stop  Coumadin/Plavix/aspirin-N/A  ____ Stop Anti-inflammatories   ____ Stop supplements until after surgery.    ____ Bring C-Pap to the hospital.

## 2015-12-01 ENCOUNTER — Other Ambulatory Visit: Payer: Self-pay | Admitting: General Surgery

## 2015-12-02 LAB — POTASSIUM: Potassium: 3.9 mmol/L (ref 3.5–5.2)

## 2015-12-06 ENCOUNTER — Encounter: Payer: Self-pay | Admitting: *Deleted

## 2015-12-07 ENCOUNTER — Ambulatory Visit
Admission: RE | Admit: 2015-12-07 | Discharge: 2015-12-07 | Disposition: A | Discharge status: Home / Self Care | Payer: 59 | Source: Ambulatory Visit | Attending: General Surgery | Admitting: General Surgery

## 2015-12-07 ENCOUNTER — Encounter: Payer: Self-pay | Admitting: *Deleted

## 2015-12-07 ENCOUNTER — Ambulatory Visit: Payer: 59 | Admitting: Certified Registered Nurse Anesthetist

## 2015-12-07 ENCOUNTER — Encounter
Admission: RE | Disposition: A | Discharge status: Home / Self Care | Payer: Self-pay | Source: Ambulatory Visit | Attending: General Surgery

## 2015-12-07 ENCOUNTER — Ambulatory Visit: Payer: 59

## 2015-12-07 DIAGNOSIS — K8010 Calculus of gallbladder with chronic cholecystitis without obstruction: Secondary | ICD-10-CM | POA: Insufficient documentation

## 2015-12-07 DIAGNOSIS — Z8719 Personal history of other diseases of the digestive system: Secondary | ICD-10-CM | POA: Diagnosis not present

## 2015-12-07 DIAGNOSIS — Z9889 Other specified postprocedural states: Secondary | ICD-10-CM | POA: Diagnosis not present

## 2015-12-07 DIAGNOSIS — K219 Gastro-esophageal reflux disease without esophagitis: Secondary | ICD-10-CM | POA: Diagnosis not present

## 2015-12-07 DIAGNOSIS — Z880 Allergy status to penicillin: Secondary | ICD-10-CM | POA: Insufficient documentation

## 2015-12-07 DIAGNOSIS — Z9189 Other specified personal risk factors, not elsewhere classified: Secondary | ICD-10-CM | POA: Diagnosis not present

## 2015-12-07 DIAGNOSIS — K8020 Calculus of gallbladder without cholecystitis without obstruction: Secondary | ICD-10-CM

## 2015-12-07 DIAGNOSIS — G4730 Sleep apnea, unspecified: Secondary | ICD-10-CM | POA: Diagnosis not present

## 2015-12-07 DIAGNOSIS — F319 Bipolar disorder, unspecified: Secondary | ICD-10-CM | POA: Insufficient documentation

## 2015-12-07 DIAGNOSIS — Z91048 Other nonmedicinal substance allergy status: Secondary | ICD-10-CM | POA: Diagnosis not present

## 2015-12-07 DIAGNOSIS — K589 Irritable bowel syndrome without diarrhea: Secondary | ICD-10-CM | POA: Insufficient documentation

## 2015-12-07 DIAGNOSIS — Z8249 Family history of ischemic heart disease and other diseases of the circulatory system: Secondary | ICD-10-CM | POA: Insufficient documentation

## 2015-12-07 DIAGNOSIS — K8000 Calculus of gallbladder with acute cholecystitis without obstruction: Secondary | ICD-10-CM

## 2015-12-07 DIAGNOSIS — E669 Obesity, unspecified: Secondary | ICD-10-CM | POA: Insufficient documentation

## 2015-12-07 DIAGNOSIS — Z881 Allergy status to other antibiotic agents status: Secondary | ICD-10-CM | POA: Diagnosis not present

## 2015-12-07 DIAGNOSIS — Z79899 Other long term (current) drug therapy: Secondary | ICD-10-CM | POA: Insufficient documentation

## 2015-12-07 DIAGNOSIS — K8044 Calculus of bile duct with chronic cholecystitis without obstruction: Secondary | ICD-10-CM | POA: Diagnosis not present

## 2015-12-07 DIAGNOSIS — Z6841 Body Mass Index (BMI) 40.0 and over, adult: Secondary | ICD-10-CM | POA: Diagnosis not present

## 2015-12-07 DIAGNOSIS — I10 Essential (primary) hypertension: Secondary | ICD-10-CM | POA: Diagnosis not present

## 2015-12-07 HISTORY — DX: Other specified postprocedural states: Z98.890

## 2015-12-07 HISTORY — DX: Family history of other specified conditions: Z84.89

## 2015-12-07 HISTORY — DX: Adverse effect of unspecified anesthetic, initial encounter: T41.45XA

## 2015-12-07 HISTORY — DX: Reserved for inherently not codable concepts without codable children: IMO0001

## 2015-12-07 HISTORY — DX: Other complications of anesthesia, initial encounter: T88.59XA

## 2015-12-07 HISTORY — DX: Nausea with vomiting, unspecified: R11.2

## 2015-12-07 HISTORY — PX: CHOLECYSTECTOMY: SHX55

## 2015-12-07 HISTORY — DX: Sleep apnea, unspecified: G47.30

## 2015-12-07 LAB — POCT PREGNANCY, URINE: Preg Test, Ur: NEGATIVE

## 2015-12-07 SURGERY — LAPAROSCOPIC CHOLECYSTECTOMY WITH INTRAOPERATIVE CHOLANGIOGRAM
Anesthesia: General | Wound class: Clean Contaminated

## 2015-12-07 MED ORDER — FENTANYL CITRATE (PF) 100 MCG/2ML IJ SOLN
INTRAMUSCULAR | Status: DC | PRN
  Administered 2015-12-07: 100 ug via INTRAVENOUS

## 2015-12-07 MED ORDER — ONDANSETRON 4 MG PO TBDP
ORAL_TABLET | ORAL | Status: AC
  Filled 2015-12-07: qty 1

## 2015-12-07 MED ORDER — LACTATED RINGERS IV SOLN
INTRAVENOUS | Status: DC | PRN
  Administered 2015-12-07: 10:00:00 via INTRAVENOUS

## 2015-12-07 MED ORDER — SUCCINYLCHOLINE CHLORIDE 20 MG/ML IJ SOLN
INTRAMUSCULAR | Status: DC | PRN
  Administered 2015-12-07: 80 mg via INTRAVENOUS

## 2015-12-07 MED ORDER — KETOROLAC TROMETHAMINE 30 MG/ML IJ SOLN
INTRAMUSCULAR | Status: DC | PRN
  Administered 2015-12-07: 30 mg via INTRAVENOUS

## 2015-12-07 MED ORDER — HYDROCODONE-ACETAMINOPHEN 5-325 MG PO TABS
1.0000 | ORAL_TABLET | ORAL | Status: DC | PRN
  Administered 2015-12-07: 1 via ORAL

## 2015-12-07 MED ORDER — ACETAMINOPHEN 10 MG/ML IV SOLN
INTRAVENOUS | Status: DC | PRN
  Administered 2015-12-07: 1000 mg via INTRAVENOUS

## 2015-12-07 MED ORDER — LACTATED RINGERS IV SOLN
INTRAVENOUS | Status: DC
  Administered 2015-12-07: 10:00:00 via INTRAVENOUS

## 2015-12-07 MED ORDER — ONDANSETRON HCL 4 MG/2ML IJ SOLN
INTRAMUSCULAR | Status: AC
  Filled 2015-12-07: qty 2

## 2015-12-07 MED ORDER — LIDOCAINE HCL (CARDIAC) 20 MG/ML IV SOLN
INTRAVENOUS | Status: DC | PRN
  Administered 2015-12-07: 60 mg via INTRAVENOUS

## 2015-12-07 MED ORDER — HYDROCODONE-ACETAMINOPHEN 5-325 MG PO TABS
ORAL_TABLET | ORAL | Status: AC
  Filled 2015-12-07: qty 1

## 2015-12-07 MED ORDER — ONDANSETRON 4 MG PO TBDP: 4.0000 mg | ORAL_TABLET | Freq: Once | ORAL | Status: DC

## 2015-12-07 MED ORDER — GLYCOPYRROLATE 0.2 MG/ML IJ SOLN
INTRAMUSCULAR | Status: DC | PRN
  Administered 2015-12-07: 0.6 mg via INTRAVENOUS

## 2015-12-07 MED ORDER — NEOSTIGMINE METHYLSULFATE 10 MG/10ML IV SOLN
INTRAVENOUS | Status: DC | PRN
  Administered 2015-12-07: 3 mg via INTRAVENOUS

## 2015-12-07 MED ORDER — FENTANYL CITRATE (PF) 100 MCG/2ML IJ SOLN
INTRAMUSCULAR | Status: AC
  Filled 2015-12-07: qty 2

## 2015-12-07 MED ORDER — FENTANYL CITRATE (PF) 100 MCG/2ML IJ SOLN
25.0000 ug | INTRAMUSCULAR | Status: DC | PRN
  Administered 2015-12-07 (×5): 25 ug via INTRAVENOUS

## 2015-12-07 MED ORDER — MIDAZOLAM HCL 2 MG/2ML IJ SOLN
INTRAMUSCULAR | Status: DC | PRN
  Administered 2015-12-07: 2 mg via INTRAVENOUS

## 2015-12-07 MED ORDER — ONDANSETRON HCL 4 MG/2ML IJ SOLN
4.0000 mg | Freq: Once | INTRAMUSCULAR | Status: AC | PRN
  Administered 2015-12-07: 4 mg via INTRAVENOUS

## 2015-12-07 MED ORDER — HYDROCODONE-ACETAMINOPHEN 5-325 MG PO TABS: 1.0000 | ORAL_TABLET | ORAL | Status: DC | PRN

## 2015-12-07 MED ORDER — ROCURONIUM BROMIDE 100 MG/10ML IV SOLN
INTRAVENOUS | Status: DC | PRN
  Administered 2015-12-07: 30 mg via INTRAVENOUS

## 2015-12-07 SURGICAL SUPPLY — 38 items
APPLIER CLIP ROT 10 11.4 M/L (STAPLE) ×2
BENZOIN TINCTURE PRP APPL 2/3 (GAUZE/BANDAGES/DRESSINGS) ×2 IMPLANT
BLADE SURG 11 STRL SS SAFETY (MISCELLANEOUS) ×2 IMPLANT
CANISTER SUCT 1200ML W/VALVE (MISCELLANEOUS) ×2 IMPLANT
CANNULA DILATOR 10 W/SLV (CANNULA) ×2 IMPLANT
CANNULA DILATOR 5 W/SLV (CANNULA) ×4 IMPLANT
CATH CHOLANG 76X19 KUMAR (CATHETERS) ×2 IMPLANT
CHLORAPREP W/TINT 26ML (MISCELLANEOUS) ×2 IMPLANT
CLIP APPLIE ROT 10 11.4 M/L (STAPLE) ×1 IMPLANT
CONRAY 60ML FOR OR (MISCELLANEOUS) ×2 IMPLANT
DISSECTOR KITTNER STICK (MISCELLANEOUS) IMPLANT
DISSECTORS/KITTNER STICK (MISCELLANEOUS)
DRAPE SHEET LG 3/4 BI-LAMINATE (DRAPES) ×2 IMPLANT
DRESSING TELFA 4X3 1S ST N-ADH (GAUZE/BANDAGES/DRESSINGS) ×2 IMPLANT
DRSG TEGADERM 2-3/8X2-3/4 SM (GAUZE/BANDAGES/DRESSINGS) ×2 IMPLANT
ENDOPOUCH RETRIEVER 10 (MISCELLANEOUS) ×2 IMPLANT
GLOVE BIO SURGEON STRL SZ7.5 (GLOVE) ×6 IMPLANT
GLOVE INDICATOR 8.0 STRL GRN (GLOVE) ×6 IMPLANT
GOWN STRL REUS W/ TWL LRG LVL3 (GOWN DISPOSABLE) ×3 IMPLANT
GOWN STRL REUS W/TWL LRG LVL3 (GOWN DISPOSABLE) ×3
IRRIGATION STRYKERFLOW (MISCELLANEOUS) ×1 IMPLANT
IRRIGATOR STRYKERFLOW (MISCELLANEOUS) ×2
IV LACTATED RINGERS 1000ML (IV SOLUTION) ×2 IMPLANT
KIT RM TURNOVER STRD PROC AR (KITS) ×2 IMPLANT
LABEL OR SOLS (LABEL) ×2 IMPLANT
NDL INSUFF ACCESS 14 VERSASTEP (NEEDLE) ×2 IMPLANT
NS IRRIG 500ML POUR BTL (IV SOLUTION) ×2 IMPLANT
PACK LAP CHOLECYSTECTOMY (MISCELLANEOUS) ×2 IMPLANT
PAD GROUND ADULT SPLIT (MISCELLANEOUS) ×2 IMPLANT
SCISSORS METZENBAUM CVD 33 (INSTRUMENTS) ×2 IMPLANT
SEAL FOR SCOPE WARMER C3101 (MISCELLANEOUS) ×2 IMPLANT
STRIP CLOSURE SKIN 1/2X4 (GAUZE/BANDAGES/DRESSINGS) ×2 IMPLANT
SUT VIC AB 0 CT2 27 (SUTURE) ×2 IMPLANT
SUT VIC AB 4-0 FS2 27 (SUTURE) ×2 IMPLANT
SWABSTK COMLB BENZOIN TINCTURE (MISCELLANEOUS) ×2 IMPLANT
TROCAR XCEL NON-BLD 11X100MML (ENDOMECHANICALS) ×2 IMPLANT
TUBING INSUFFLATOR HI FLOW (MISCELLANEOUS) ×2 IMPLANT
WATER STERILE IRR 1000ML POUR (IV SOLUTION) ×2 IMPLANT

## 2015-12-07 NOTE — Op Note (Signed)
Preoperative diagnosis: Chronic cholecystitis and cholelithiasis.  Postoperative diagnosis: Same.  Operative procedure laparoscopic cholecystectomy, attempted glandular grams.  Operating surgeon Dellis Filbert or not, M.D.  Anesthesia: Gen. endotracheal.  Estimated blood loss: Less than 5 mL.  Clinical note this 16 or woman has had episodic abdominal pain and ultrasound showed evidence of a stone in the gallbladder. She was considered a candidate for elective cholecystectomy.  Operative note: With the patient under adequate general anesthesia she was placed in the reverse Trendelenburg position and the abdomen prepped with ChloraPrep and draped. A varies needle was placed or transient umbilical incision. After assuring intra-abdominal location with the hanging drop test the abdomen was insufflated with CO2 a 10 mm's Hg pressure. A 10 mm Step port was expanded and inspection showed no injury from initial port placement. The patient was placed in reverse Trendelenburg position and rolled to the left. An 11 mm XL port was placed in the epigastrium and 2-5 mm Ports were placed in right lateral wall.  The gallbladder was noted to be chronically inflamed. Partially intrahepatic. The fundus of the gallbladder was placed on cephalad traction. The neck of the gallbladder was cleared of adipose tissue and the cystic duct and cystic artery identified. Attempts at cholangiograms making use of a Kumar catheter were unsuccessful. The cystic duct and cystic artery were doubly clipped and divided. The gallbladder was removed from the liver bed making use of hook cautery dissection. A small rent in the gallbladder did yield bile but no stone loss. The gallbladder was completely removed and placed into an Endo Catch bag. This was then delivered to the umbilical port site. It was necessary to expand this incision by about 2 mm to allow extraction of the 14 mm stone. After reestablishing pneumoperitoneum the right upper  quadrant was irrigated with lactated Ringer solution. Good hemostasis was noted. Inspection from the epigastric site showed no evidence of injury from initial port placement. The abdomen was then desufflated and ports removed under direct vision. The fascia at the umbilicus was closed with a single 0 Vicryl simple suture. Skin incisions were closed with 4-0 Vicryl septic sutures. Benzoin, Steri-Strips, Telfa and dressings were applied.  Patient tolerated the procedure well and was taken recovery in stable condition.

## 2015-12-07 NOTE — H&P (Signed)
Ashley Floyd is an 45 y.o. female.   Chief Complaint: Gallstones. HPI: History of epigastric pressure. Negative cardiopulmonary exam.  U/S showed stones with one impacted in the neck of the gallbladder. Minimal change in symptoms since November exam.   Past Medical History  Diagnosis Date  . Depression   . Dysmenorrhea   . Endometriosis   . GERD (gastroesophageal reflux disease)   . Herpes genitalis   . IBS (irritable bowel syndrome)   . Obesity   . Ovarian cyst 1998    right  . Bipolar affective disorder ([place])   . Hypertension   . History of nuclear stress test     a. 08/2015: no EKG changes concerning for ischemia, normal wall motion, EF >60%, low risk study  . Anxiety     a. per epic care everywhere & patient  . Hemorrhoid   . Sinus infection   . Shortness of breath dyspnea   . Family history of adverse reaction to anesthesia     PTS MOM HAS TROUBLE WITH ANESTHESIA DUE TO HER SMOKING HISTORY  . Complication of anesthesia     PT STATES SHE HAS A SMALL MOUTH AND HAS HAD DENTAL WORK ON HER 2 FRONT TEETH AND SHE WANTS EXTRA CARE TAKEN FOR HER TEETH  . PONV (postoperative nausea and vomiting)   . Sleep apnea     "MILD"-NO CPAP-LAST SLEEP STUDY YEARS AGO    Past Surgical History  Procedure Laterality Date  . Ovarian cyst removal    . Urethral dilation    . Upper gi endoscopy  2015    Dr Allen Norris    Family History  Problem Relation Age of Onset  . Arrhythmia Mother   . Hypertension Maternal Grandmother   . Hypertension Maternal Grandfather   . Heart disease Paternal Grandmother   . Heart attack Paternal Grandmother   . Heart failure Maternal Aunt   . Heart disease Maternal Aunt     CABG   Social History:  reports that she has never smoked. She has never used smokeless tobacco. She reports that she drinks alcohol. She reports that she does not use illicit drugs.  Allergies:  Allergies  Allergen Reactions  . Cephalosporins Rash  . Omnicef [Cefdinir]     Legs itch  .  Penicillins Other (See Comments)    Yeast  Rash   . Adhesive [Tape] Rash    "VERY SENSITIVE SKIN"  "ALL ADHESIVES BOTHER ME"    Medications Prior to Admission  Medication Sig Dispense Refill  . carbamazepine (TEGRETOL) 200 MG tablet Take 600 mg by mouth at bedtime.     . clonazePAM (KLONOPIN) 1 MG tablet Take 2 mg by mouth at bedtime.     Marland Kitchen dexlansoprazole (DEXILANT) 60 MG capsule Take 1 capsule (60 mg total) by mouth daily. (Patient taking differently: Take 60 mg by mouth at bedtime. ) 90 capsule 3  . diphenhydrAMINE (BENADRYL) 25 MG tablet Take 25 mg by mouth at bedtime as needed.    Marland Kitchen losartan-hydrochlorothiazide (HYZAAR) 50-12.5 MG per tablet Take 1 tablet by mouth daily. (Patient taking differently: Take 1 tablet by mouth every morning. ) 90 tablet 3  . naproxen sodium (ANAPROX) 550 MG tablet Take 550 mg by mouth 2 (two) times daily with a meal.     . propranolol (INDERAL) 20 MG tablet Take 1 tablet (20 mg total) by mouth 3 (three) times daily as needed. (Patient taking differently: Take 20 mg by mouth 3 (three) times daily as needed. )  90 tablet 3  . torsemide (DEMADEX) 20 MG tablet Take 10 mg by mouth as needed.    . triamcinolone (NASACORT) 55 MCG/ACT AERO nasal inhaler Place 2 sprays into the nose daily.    . valACYclovir (VALTREX) 500 MG tablet Take 500 mg by mouth 3 (three) times daily as needed.     . potassium chloride SA (K-DUR,KLOR-CON) 20 MEQ tablet Take 1 tablet (20 mEq total) by mouth 2 (two) times a week. Take on Saturday and Sunday only (Patient taking differently: Take 20 mEq by mouth as needed (TAKES WHEN TAKING TORSEMIDE). ) 24 tablet 3    Results for orders placed or performed during the hospital encounter of 12/07/15 (from the past 48 hour(s))  Pregnancy, urine POC     Status: None   Collection Time: 12/07/15  9:17 AM  Result Value Ref Range   Preg Test, Ur NEGATIVE NEGATIVE    Comment:        THE SENSITIVITY OF THIS METHODOLOGY IS >24 mIU/mL    No results  found.  Review of Systems  Constitutional: Negative.   HENT: Negative.   Respiratory: Negative.   Cardiovascular: Negative.   Gastrointestinal: Positive for abdominal pain.  Genitourinary: Negative.   Musculoskeletal: Negative.   Skin: Negative.     Blood pressure 126/68, pulse 97, temperature 98 F (36.7 C), resp. rate 16, height 5\' 4"  (1.626 m), weight 238 lb (107.956 kg), last menstrual period 11/12/2015, SpO2 96 %. Physical Exam  Constitutional: She appears well-developed and well-nourished.  Neck: Neck supple. No thyromegaly present.  Cardiovascular: Normal rate and regular rhythm.   Respiratory: Effort normal and breath sounds normal.  GI: Soft. She exhibits no distension. There is no tenderness.     Assessment/Plan DX: Symptomatic cholelitiasis. Plan: Cholecystectomy.   Robert Bellow 12/07/2015, 9:39 AM

## 2015-12-07 NOTE — Anesthesia Preprocedure Evaluation (Signed)
Anesthesia Evaluation  Patient identified by MRN, date of birth, ID band Patient awake    Reviewed: Allergy & Precautions, NPO status , Patient's Chart, lab work & pertinent test results  History of Anesthesia Complications (+) PONV and history of anesthetic complications  Airway Mallampati: III  TM Distance: <3 FB    Comment: Small mouth Dental  (+) Chipped, Caps Patient grinds her teeth:   Pulmonary shortness of breath and with exertion, sleep apnea ,    Pulmonary exam normal breath sounds clear to auscultation       Cardiovascular hypertension, Normal cardiovascular exam     Neuro/Psych Anxiety Depression Bipolar Disorder    GI/Hepatic Neg liver ROS, GERD  Medicated and Controlled,  Endo/Other  negative endocrine ROS  Renal/GU negative Renal ROS     Musculoskeletal negative musculoskeletal ROS (+)   Abdominal (+) + obese,   Peds negative pediatric ROS (+)  Hematology negative hematology ROS (+)   Anesthesia Other Findings   Reproductive/Obstetrics                             Anesthesia Physical Anesthesia Plan  ASA: II  Anesthesia Plan: General   Post-op Pain Management:    Induction: Intravenous  Airway Management Planned: Oral ETT  Additional Equipment:   Intra-op Plan:   Post-operative Plan: Extubation in OR  Informed Consent: I have reviewed the patients History and Physical, chart, labs and discussed the procedure including the risks, benefits and alternatives for the proposed anesthesia with the patient or authorized representative who has indicated his/her understanding and acceptance.   Dental advisory given  Plan Discussed with: CRNA and Surgeon  Anesthesia Plan Comments:         Anesthesia Quick Evaluation

## 2015-12-07 NOTE — Transfer of Care (Signed)
Immediate Anesthesia Transfer of Care Note  Patient: Ashley Floyd  Procedure(s) Performed: Procedure(s): LAPAROSCOPIC CHOLECYSTECTOMY  (N/A)  Patient Location: PACU  Anesthesia Type:General  Level of Consciousness: awake, alert  and oriented  Airway & Oxygen Therapy: Patient Spontanous Breathing  Post-op Assessment: Report given to RN  Post vital signs: stable  Last Vitals:  Filed Vitals:   12/07/15 0909 12/07/15 1107  BP: 126/68 119/67  Pulse: 97 95  Temp: 36.7 C 36.6 C  Resp: 16 16    Complications: No apparent anesthesia complications

## 2015-12-07 NOTE — Anesthesia Procedure Notes (Signed)
Procedure Name: Intubation Date/Time: 12/07/2015 10:12 AM Performed by: [id number], Norina Cowper Pre-anesthesia Checklist: Timeout performed, Patient being monitored, Suction available, Emergency Drugs available and Patient identified Patient Re-evaluated:Patient Re-evaluated prior to inductionOxygen Delivery Method: Circle system utilized Preoxygenation: Pre-oxygenation with 100% oxygen Intubation Type: IV induction, Rapid sequence and Cricoid Pressure applied Ventilation: Mask ventilation without difficulty Laryngoscope Size: Mac and 3 Grade View: Grade II Tube size: 6.5 mm Number of attempts: 1 Placement Confirmation: ETT inserted through vocal cords under direct vision,  positive ETCO2,  breath sounds checked- equal and bilateral and CO2 detector Secured at: 21 cm Tube secured with: Tape

## 2015-12-07 NOTE — Discharge Instructions (Signed)

## 2015-12-08 LAB — SURGICAL PATHOLOGY

## 2015-12-08 NOTE — Anesthesia Postprocedure Evaluation (Signed)
Anesthesia Post Note  Patient: Ashley Floyd  Procedure(s) Performed: Procedure(s) (LRB): LAPAROSCOPIC CHOLECYSTECTOMY  (N/A)  Patient location during evaluation: PACU Anesthesia Type: General Level of consciousness: awake and alert and oriented Pain management: pain level controlled Vital Signs Assessment: post-procedure vital signs reviewed and stable Respiratory status: spontaneous breathing Cardiovascular status: blood pressure returned to baseline Anesthetic complications: no    Last Vitals:  Filed Vitals:   12/07/15 1226 12/07/15 1318  BP: 129/78 135/86  Pulse: 90 95  Temp: 36.4 C   Resp: 16 16    Last Pain:  Filed Vitals:   12/07/15 1319  PainSc: 5                  Zeanna Sunde

## 2015-12-18 ENCOUNTER — Encounter: Payer: Self-pay | Admitting: General Surgery

## 2015-12-18 ENCOUNTER — Ambulatory Visit (INDEPENDENT_AMBULATORY_CARE_PROVIDER_SITE_OTHER): Payer: 59 | Admitting: General Surgery

## 2015-12-18 VITALS — BP 132/68 | HR 68 | Resp 12 | Ht 64.0 in | Wt 240.0 lb

## 2015-12-18 DIAGNOSIS — K8020 Calculus of gallbladder without cholecystitis without obstruction: Secondary | ICD-10-CM

## 2015-12-18 NOTE — Patient Instructions (Addendum)
Patient to return as needed. Proper lifting techniques reviewed. 

## 2015-12-18 NOTE — Progress Notes (Signed)
Patient ID: Ashley Floyd, female   DOB: 06-21-1971, 45 y.o.   MRN: [id number]  Chief Complaint  Patient presents with  . Routine Post Op    gallbladder    HPI ONETHA RYHERD is a 45 y.o. female here today for her post op gallbladder surgery done on 12/07/15. Patient states she is having some chest pain on and off since her surgery. She is still sore in the area. Having regular bowel movements, still soft. Urinating and eating regular.  I personally reviewed the history.   HPI  Past Medical History  Diagnosis Date  . Depression   . Dysmenorrhea   . Endometriosis   . GERD (gastroesophageal reflux disease)   . Herpes genitalis   . IBS (irritable bowel syndrome)   . Obesity   . Ovarian cyst 1998    right  . Bipolar affective disorder ([place])   . Hypertension   . History of nuclear stress test     a. 08/2015: no EKG changes concerning for ischemia, normal wall motion, EF >60%, low risk study  . Anxiety     a. per epic care everywhere & patient  . Hemorrhoid   . Sinus infection   . Shortness of breath dyspnea   . Family history of adverse reaction to anesthesia     PTS MOM HAS TROUBLE WITH ANESTHESIA DUE TO HER SMOKING HISTORY  . Complication of anesthesia     PT STATES SHE HAS A SMALL MOUTH AND HAS HAD DENTAL WORK ON HER 2 FRONT TEETH AND SHE WANTS EXTRA CARE TAKEN FOR HER TEETH  . PONV (postoperative nausea and vomiting)   . Sleep apnea     "MILD"-NO CPAP-LAST SLEEP STUDY YEARS AGO    Past Surgical History  Procedure Laterality Date  . Ovarian cyst removal    . Urethral dilation    . Upper gi endoscopy  2015    Dr Allen Norris  . Cholecystectomy N/A 12/07/2015    Procedure: LAPAROSCOPIC CHOLECYSTECTOMY ;  Surgeon: Robert Bellow, MD;  Location: [facility] ORS;  Service: General;  Laterality: N/A;    Family History  Problem Relation Age of Onset  . Arrhythmia Mother   . Hypertension Maternal Grandmother   . Hypertension Maternal Grandfather   . Heart disease Paternal Grandmother    . Heart attack Paternal Grandmother   . Heart failure Maternal Aunt   . Heart disease Maternal Aunt     CABG    Social History Social History  Substance Use Topics  . Smoking status: Never Smoker   . Smokeless tobacco: Never Used  . Alcohol Use: 0.0 oz/week    0 Standard drinks or equivalent per week     Comment: rarely    Allergies  Allergen Reactions  . Cephalosporins Rash  . Omnicef [Cefdinir]     Legs itch  . Penicillins Other (See Comments)    Yeast  Rash   . Adhesive [Tape] Rash    "VERY SENSITIVE SKIN"  Causes scarring     Current Outpatient Prescriptions  Medication Sig Dispense Refill  . carbamazepine (TEGRETOL) 200 MG tablet Take 600 mg by mouth at bedtime.     . clonazePAM (KLONOPIN) 1 MG tablet Take 2 mg by mouth at bedtime.     Marland Kitchen dexlansoprazole (DEXILANT) 60 MG capsule Take 1 capsule (60 mg total) by mouth daily. (Patient taking differently: Take 60 mg by mouth at bedtime. ) 90 capsule 3  . diphenhydrAMINE (BENADRYL) 25 MG tablet Take 25 mg by  mouth at bedtime as needed.    Marland Kitchen losartan-hydrochlorothiazide (HYZAAR) 50-12.5 MG per tablet Take 1 tablet by mouth daily. (Patient taking differently: Take 1 tablet by mouth every morning. ) 90 tablet 3  . naproxen sodium (ANAPROX) 550 MG tablet Take 550 mg by mouth 2 (two) times daily with a meal.     . potassium chloride SA (K-DUR,KLOR-CON) 20 MEQ tablet Take 1 tablet (20 mEq total) by mouth 2 (two) times a week. Take on Saturday and Sunday only (Patient taking differently: Take 20 mEq by mouth as needed (TAKES WHEN TAKING TORSEMIDE). ) 24 tablet 3  . propranolol (INDERAL) 20 MG tablet Take 1 tablet (20 mg total) by mouth 3 (three) times daily as needed. (Patient taking differently: Take 20 mg by mouth 3 (three) times daily as needed. ) 90 tablet 3  . torsemide (DEMADEX) 20 MG tablet Take 10 mg by mouth as needed.    . triamcinolone (NASACORT) 55 MCG/ACT AERO nasal inhaler Place 2 sprays into the nose daily.    .  valACYclovir (VALTREX) 500 MG tablet Take 500 mg by mouth 3 (three) times daily as needed.      No current facility-administered medications for this visit.    Review of Systems Review of Systems  Constitutional: Negative.   Respiratory: Negative.   Cardiovascular: Negative.     Blood pressure 132/68, pulse 68, resp. rate 12, height 5\' 4"  (1.626 m), weight 240 lb (108.863 kg).  Physical Exam Physical Exam  Constitutional: She is oriented to person, place, and time. She appears well-developed and well-nourished.  Cardiovascular: Normal rate, regular rhythm and normal heart sounds.   Pulmonary/Chest: Effort normal and breath sounds normal.  Abdominal: Soft. Normal appearance and bowel sounds are normal.  Port sites are clean and healing well.   Neurological: She is alert and oriented to person, place, and time.  Skin: Skin is warm and dry.    Data Reviewed Chronic cholecystitis/ cholelithiasis, cholesterolosis.   Assessment    Doing well post cholecystectomy      Plan    Return to work December 25, 2015.    Patient to return as needed. This information has been scribed by Gaspar Cola CMA.  PCP:  No Pcp   Robert Bellow 12/19/2015, 4:47 PM

## 2016-01-04 ENCOUNTER — Telehealth: Payer: Self-pay | Admitting: *Deleted

## 2016-01-04 NOTE — Telephone Encounter (Signed)
She is to try OTC Imodium to control diarrhea (tablets can be adjusted to avoid constipation) per Dr Bary Castilla. It may take some time for the GI to settle down post surgery. Add Fiber supplement to diet. Recommend appt to see her GI (Dr Vira Agar) as well since she has IBS, pt agrees.

## 2016-01-04 NOTE — Telephone Encounter (Signed)
Patient had gallbladder surgery by Dr.Byrnett on 12/07/15. She is still having stomach cramping and diarrhea after every time she eats. She has not had a solid bowel movement since surgery. She wants to know what she needs to do because she can not continue to go to work everyday in this condition.

## 2016-04-05 ENCOUNTER — Ambulatory Visit: Payer: 59 | Admitting: Podiatry

## 2016-05-23 ENCOUNTER — Ambulatory Visit (INDEPENDENT_AMBULATORY_CARE_PROVIDER_SITE_OTHER): Payer: 59 | Admitting: Podiatry

## 2016-05-23 ENCOUNTER — Ambulatory Visit (INDEPENDENT_AMBULATORY_CARE_PROVIDER_SITE_OTHER): Payer: 59

## 2016-05-23 ENCOUNTER — Encounter: Payer: Self-pay | Admitting: Podiatry

## 2016-05-23 VITALS — BP 124/83 | HR 75 | Resp 16

## 2016-05-23 DIAGNOSIS — R600 Localized edema: Secondary | ICD-10-CM

## 2016-05-23 DIAGNOSIS — M79672 Pain in left foot: Secondary | ICD-10-CM

## 2016-05-23 DIAGNOSIS — M722 Plantar fascial fibromatosis: Secondary | ICD-10-CM | POA: Diagnosis not present

## 2016-05-23 DIAGNOSIS — M7989 Other specified soft tissue disorders: Secondary | ICD-10-CM

## 2016-05-23 DIAGNOSIS — M79671 Pain in right foot: Secondary | ICD-10-CM

## 2016-05-23 MED ORDER — TRIAMCINOLONE ACETONIDE 10 MG/ML IJ SUSP
10.0000 mg | Freq: Once | INTRAMUSCULAR | Status: AC
  Administered 2016-05-23: 10 mg

## 2016-05-23 NOTE — Progress Notes (Signed)
   Subjective:    Patient ID: Ashley Floyd, female    DOB: Apr 20, 1971, 45 y.o.   MRN: [id number]  HPI    Review of Systems  Constitutional: Positive for fatigue.  Cardiovascular: Positive for leg swelling.  Musculoskeletal: Positive for myalgias.  All other systems reviewed and are negative.      Objective:   Physical Exam        Assessment & Plan:

## 2016-05-24 NOTE — Progress Notes (Signed)
Subjective:     Patient ID: Ashley Floyd, female   DOB: 06-Oct-1971, 45 y.o.   MRN: [id number]  HPI patient presents stating she's getting a lot of pain in her left heel and foot and moderate in the right foot. States it's been present for a fairly long time and is gotten worse over that time   Review of Systems  All other systems reviewed and are negative.      Objective:   Physical Exam  Constitutional: She is oriented to person, place, and time.  Cardiovascular: Intact distal pulses.   Musculoskeletal: Normal range of motion.  Neurological: She is oriented to person, place, and time.  Skin: Skin is warm and dry.  Nursing note and vitals reviewed.  neurovascular status intact muscle strength adequate with exquisite discomfort plantar aspect left foot at the insertional point tendon the calcaneus and mild forefoot pain noted right which am hoping is compensatory. Patient's found to have good digital perfusion and is well oriented 3     Assessment:     Inflammatory fasciitis left with forefoot capsulitis right    Plan:     H&P conditions reviewed and injected the left plantar fashion 3 mg Kenalog 5 mill grams Xylocaine and advised on night splint usage which was dispensed with all instructions on usage. Reappoint in the next 2 weeks or earlier if needed  X-rays indicated small spur formation with no indications of stress fracture or forefoot arthritis

## 2016-05-31 ENCOUNTER — Other Ambulatory Visit: Payer: Self-pay | Admitting: Gastroenterology

## 2016-06-13 ENCOUNTER — Ambulatory Visit (INDEPENDENT_AMBULATORY_CARE_PROVIDER_SITE_OTHER): Payer: 59 | Admitting: Podiatry

## 2016-06-13 DIAGNOSIS — M722 Plantar fascial fibromatosis: Secondary | ICD-10-CM

## 2016-06-13 MED ORDER — TRIAMCINOLONE ACETONIDE 10 MG/ML IJ SUSP
10.0000 mg | Freq: Once | INTRAMUSCULAR | Status: AC
  Administered 2016-06-13: 10 mg

## 2016-06-15 NOTE — Progress Notes (Signed)
Subjective:     Patient ID: Ashley Floyd, female   DOB: [birth year], 45 y.o.   MRN: [id number]  HPI patient presents stating it was doing pretty well but now it's back to where it was on the left heel   Review of Systems     Objective:   Physical Exam Neurovascular status intact muscle strength adequate with continued discomfort left plantar fascia at the insertional point tendon into the calcaneus with moderate depression of the arch    Assessment:     Plantar fasciitis noted left with inflammation fluid still noted    Plan:     Reviewed condition and reinjected the plantar fascia 3 mg Kenalog 5 mg Xylocaine and dispensed a fascial brace with all instructions on usage

## 2016-07-11 ENCOUNTER — Ambulatory Visit: Payer: 59 | Admitting: Podiatry

## 2016-07-18 ENCOUNTER — Ambulatory Visit: Payer: 59 | Admitting: Podiatry

## 2016-07-25 ENCOUNTER — Ambulatory Visit: Payer: 59 | Admitting: Podiatry

## 2016-08-01 ENCOUNTER — Other Ambulatory Visit: Payer: Self-pay | Admitting: Cardiovascular Disease

## 2016-08-05 ENCOUNTER — Other Ambulatory Visit: Payer: Self-pay

## 2016-08-05 ENCOUNTER — Telehealth: Payer: Self-pay | Admitting: Gastroenterology

## 2016-08-05 DIAGNOSIS — K219 Gastro-esophageal reflux disease without esophagitis: Secondary | ICD-10-CM

## 2016-08-05 MED ORDER — DEXLANSOPRAZOLE 60 MG PO CPDR: 1.0000 | DELAYED_RELEASE_CAPSULE | Freq: Every day | ORAL | 3 refills | Status: DC

## 2016-08-05 NOTE — Telephone Encounter (Signed)
Refill on Dexilant 60 mg. She does mail order [employer]. She is running low. Do we have samples or should she come in for an appointment.

## 2016-08-05 NOTE — Telephone Encounter (Signed)
Refill sent to [facility] per pt's request.

## 2016-11-05 ENCOUNTER — Ambulatory Visit (INDEPENDENT_AMBULATORY_CARE_PROVIDER_SITE_OTHER): Payer: 59 | Admitting: Cardiovascular Disease

## 2016-11-05 ENCOUNTER — Encounter: Payer: Self-pay | Admitting: Cardiovascular Disease

## 2016-11-05 VITALS — BP 120/92 | HR 98 | Ht 64.0 in | Wt 240.2 lb

## 2016-11-05 DIAGNOSIS — G4733 Obstructive sleep apnea (adult) (pediatric): Secondary | ICD-10-CM | POA: Diagnosis not present

## 2016-11-05 DIAGNOSIS — R5381 Other malaise: Secondary | ICD-10-CM

## 2016-11-05 DIAGNOSIS — I10 Essential (primary) hypertension: Secondary | ICD-10-CM | POA: Diagnosis not present

## 2016-11-05 DIAGNOSIS — R0602 Shortness of breath: Secondary | ICD-10-CM

## 2016-11-05 MED ORDER — HYDROCHLOROTHIAZIDE 25 MG PO TABS: 25.0000 mg | ORAL_TABLET | Freq: Every day | ORAL | 3 refills | Status: DC | PRN

## 2016-11-05 NOTE — Progress Notes (Signed)
Cardiology Office Note  Date:  11/05/2016   ID:  SOJOURNER Floyd, DOB [birth year], MRN [id number]  PCP:  No PCP Per Patient   Chief Complaint  Patient presents with  . other    1 yr f/u c/o elevated BP , stress and headaches. Meds reviewed verbally with pt.    HPI:  [name] is a 45 year old woman with obesity, bipolar disorder, depression, previous primary care through [facility] (no longer available), Previously evaluated for blood pressure and hypertension, shortness of breath She reports weight gain, dietary indiscretion. Significant family history of diabetes. She presents today for follow-up of her shortness of breath Considerable cardiac workup in the past including echocardiogram, stress test, chest CT scan  Previously noted to have lung nodule on CT scan This resolved on repeat CT scan 3 months later  She had right upper quadrant ultrasound confirming gallstones. Seen by Dr. Bary Castilla and given her continued symptoms Chest discomfort, upper epigastric discomfort after eating, cholecystectomy was recommended. Surgery January 2017 was successful  Large gallstone removed  Still with short of breath No regular exercise program, weight continues to run high Sometimes abdomen feels tight Previously did not tolerate torsemide, bottomed out her blood pressure  Occasionally has palpitations, tachycardia Previously tried metoprolol, did not want to stay on the medication secondary to potential side effects  EKG on today's visit shows normal sinus rhythm with rate 98 bpm, no significant ST or T-wave changes  Other past medical history high blood pressure noted fall of 2015 , 141/100. Started on HCTZ 25 mg daily. Did not seem to help her blood pressure. changed to losartan HCTZ 50/12.5 mill grams daily. Blood pressure improved. She started having palpitations felt in her face, commonly at nighttime. Started on metoprolol 25 mg.  This helped both headaches and palpitations.  stopped out of concern for diabetes   weight gain, Drinks [facility] through the day, fast food, no exercise.  lab work October 2015 showing normal BMP, low vitamin D   PMH:   has a past medical history of Anxiety; Bipolar affective disorder ([place]); Complication of anesthesia; Depression; Dysmenorrhea; Endometriosis; Family history of adverse reaction to anesthesia; GERD (gastroesophageal reflux disease); Hemorrhoid; Herpes genitalis; History of nuclear stress test; Hypertension; IBS (irritable bowel syndrome); Obesity; Ovarian cyst (1998); PONV (postoperative nausea and vomiting); Shortness of breath dyspnea; Sinus infection; and Sleep apnea.  PSH:    Past Surgical History:  Procedure Laterality Date  . CHOLECYSTECTOMY N/A 12/07/2015   Procedure: LAPAROSCOPIC CHOLECYSTECTOMY ;  Surgeon: Robert Bellow, MD;  Location: [facility] ORS;  Service: General;  Laterality: N/A;  . OVARIAN CYST REMOVAL    . UPPER GI ENDOSCOPY  2015   Dr Allen Norris  . URETHRAL DILATION      Current Outpatient Prescriptions  Medication Sig Dispense Refill  . carbamazepine (TEGRETOL) 200 MG tablet Take 600 mg by mouth at bedtime.     . clonazePAM (KLONOPIN) 1 MG tablet Take 2 mg by mouth at bedtime.     Marland Kitchen dexlansoprazole (DEXILANT) 60 MG capsule Take 1 capsule (60 mg total) by mouth daily. 90 capsule 3  . diphenhydrAMINE (BENADRYL) 25 MG tablet Take 25 mg by mouth at bedtime as needed. Reported on 05/23/2016    . losartan-hydrochlorothiazide (HYZAAR) 50-12.5 MG tablet Take 1 tablet by mouth  daily 90 tablet 3  . naproxen sodium (ANAPROX) 550 MG tablet Take 550 mg by mouth 2 (two) times daily with a meal. Reported on 05/23/2016    . triamcinolone (NASACORT)  55 MCG/ACT AERO nasal inhaler Place 2 sprays into the nose daily. Reported on 05/23/2016    . valACYclovir (VALTREX) 500 MG tablet Take 500 mg by mouth 3 (three) times daily as needed.     . zaleplon (SONATA) 10 MG capsule Take 10 mg by mouth at bedtime as needed for sleep.     . hydrochlorothiazide (HYDRODIURIL) 25 MG tablet Take 1 tablet (25 mg total) by mouth daily as needed. 30 tablet 3   No current facility-administered medications for this visit.      Allergies:   Cephalosporins; Omnicef [cefdinir]; Penicillins; and Adhesive [tape]   Social History:  The patient  reports that she has never smoked. She has never used smokeless tobacco. She reports that she drinks alcohol. She reports that she does not use drugs.   Family History:   family history includes Arrhythmia in her mother; Heart attack in her paternal grandmother; Heart disease in her maternal aunt and paternal grandmother; Heart failure in her maternal aunt; Hypertension in her maternal grandfather and maternal grandmother.    Review of Systems: Review of Systems  Constitutional: Positive for malaise/fatigue.  Respiratory: Positive for shortness of breath.   Cardiovascular: Positive for palpitations.  Gastrointestinal: Negative.   Musculoskeletal: Negative.   Neurological: Negative.   Psychiatric/Behavioral: Negative.   All other systems reviewed and are negative.    PHYSICAL EXAM: VS:  BP (!) 120/92 (BP Location: Left Arm, Patient Position: Sitting, Cuff Size: Large)   Pulse 98   Ht 5\' 4"  (1.626 m)   Wt 240 lb 4 oz (109 kg)   BMI 41.24 kg/m  , BMI Body mass index is 41.24 kg/m. GEN: Well nourished, well developed, in no acute distress, obese  HEENT: normal  Neck: no JVD, carotid bruits, or masses Cardiac: RRR; no murmurs, rubs, or gallops,no edema  Respiratory:  clear to auscultation bilaterally, normal work of breathing GI: soft, nontender, nondistended, + BS MS: no deformity or atrophy  Skin: warm and dry, no rash Neuro:  Strength and sensation are intact Psych: euthymic mood, full affect    Recent Labs: 12/01/2015: Potassium 3.9    Lipid Panel No results found for: CHOL, HDL, LDLCALC, TRIG    Wt Readings from Last 3 Encounters:  11/05/16 240 lb 4 oz (109 kg)   12/18/15 240 lb (108.9 kg)  12/07/15 238 lb (108 kg)       ASSESSMENT AND PLAN:  Essential hypertension - Plan: EKG 12-Lead, AMB referral to pulmonary rehabilitation Blood pressure is well controlled on today's visit. No changes made to the medications.  SOB (shortness of breath) - Plan: AMB referral to pulmonary rehabilitation Likely secondary to obesity and deconditioning Unable to exclude small component of chronic diastolic CHF Recommended she take extra HCTZ for any swelling  Morbid obesity ([place]) - Plan: AMB referral to pulmonary rehabilitation Very short of breath likely secondary to weight and conditioning Recommended she enroll in pulmonary rehabilitation/respiratory therapy services Order has been placed  OSA (obstructive sleep apnea) - Plan: AMB referral to pulmonary rehabilitation Reports that she may have sleep apnea, chronic fatigue Tested many years ago, told that she did not need CPAP at that time Weight has changed since then She will discuss this with Dr. Mortimer Fries  Physical deconditioning - Plan: AMB referral to pulmonary rehabilitation Likely one of the main issues. Recommended a structured exercise program for breathing, conditioning. Breathing dramatically limited by her morbid obesity   Total encounter time more than 25 minutes  Greater than 50%  was spent in counseling and coordination of care with the patient   Disposition:   F/U  12 months prn   Orders Placed This Encounter  Procedures  . AMB referral to pulmonary rehabilitation  . EKG 12-Lead     Signed, Esmond Plants, M.D., Ph.D. 11/05/2016  [place], [place]

## 2016-11-05 NOTE — Patient Instructions (Addendum)
Medication Instructions:   No medication changes made  Labwork:  No new labs needed  Testing/Procedures:  No further testing at this time  We will order pulmonary rehab for shortness of breath They will review your chart and contact you with an appointment   I recommend watching educational videos on topics of interest to you at:       [email or web address]  Enter code: [email or web address]    Follow-Up: It was a pleasure seeing you in the office today. Please call us if you have new issues that need to be addressed before your next appt.  [phone]  Your physician wants you to follow-up in:  As needed   If you need a refill on your cardiac medications before your next appointment, please call your pharmacy.

## 2016-11-07 ENCOUNTER — Ambulatory Visit (INDEPENDENT_AMBULATORY_CARE_PROVIDER_SITE_OTHER): Payer: 59 | Admitting: Internal Medicine

## 2016-11-07 ENCOUNTER — Encounter: Payer: Self-pay | Admitting: Internal Medicine

## 2016-11-07 ENCOUNTER — Telehealth: Payer: Self-pay | Admitting: Cardiovascular Disease

## 2016-11-07 VITALS — BP 136/88 | HR 94 | Ht 64.0 in | Wt 240.0 lb

## 2016-11-07 DIAGNOSIS — G4733 Obstructive sleep apnea (adult) (pediatric): Secondary | ICD-10-CM

## 2016-11-07 DIAGNOSIS — G4719 Other hypersomnia: Secondary | ICD-10-CM

## 2016-11-07 NOTE — Telephone Encounter (Signed)
Patient states that she forget to mention at her visit that she is having headaches and has had one since Monday. She does not currently have a primary care provider and feels that maybe she needs some metoprolol which she was on before for high blood pressure. Let her know that she may want to check with her primary care physician about the headaches since her blood pressure is stable. She stated that in the past her headaches correlated with high blood pressure and that the metoprolol helped. Blood pressure today 136/88 and she is concerned about this and wanted to know what she could do. Stressed the importance of her finding a PCP. Let her know that I would forward to Dr. Rockey Situ and we would be in touch. She was appreciative for the call and had no further questions at this time.

## 2016-11-07 NOTE — Patient Instructions (Signed)
Needs Sleep STudy

## 2016-11-07 NOTE — Progress Notes (Signed)
[facility] Consultation      Date: 11/07/2016,   MRN# [id number] Ashley Floyd Sep 13, 1971 Code Status:  Hosp day:@LENGTHOFSTAYDAYS @ Referring MD: @ATDPROV @     PCP:      Admission                  Current  Ashley Floyd is a 45 y.o. old female seen in consultation for abnormal CTchest.     CHIEF COMPLAINT:   Follow up for fatigue   HISTORY OF PRESENT ILLNESS  seen today for problems with sleep Patient  has been having sleep problems for over years Patient has been having excessive daytime sleepiness Patient has been having extreme fatigue and tiredness, lack of energy Patient has been told that she has very  Loud snoring every night      Current Medication:  Current Outpatient Prescriptions:  .  carbamazepine (TEGRETOL) 200 MG tablet, Take 600 mg by mouth at bedtime. , Disp: , Rfl:  .  clonazePAM (KLONOPIN) 1 MG tablet, Take 2 mg by mouth at bedtime. , Disp: , Rfl:  .  dexlansoprazole (DEXILANT) 60 MG capsule, Take 1 capsule (60 mg total) by mouth daily., Disp: 90 capsule, Rfl: 3 .  diphenhydrAMINE (BENADRYL) 25 MG tablet, Take 25 mg by mouth at bedtime as needed. Reported on 05/23/2016, Disp: , Rfl:  .  hydrochlorothiazide (HYDRODIURIL) 25 MG tablet, Take 1 tablet (25 mg total) by mouth daily as needed., Disp: 30 tablet, Rfl: 3 .  losartan-hydrochlorothiazide (HYZAAR) 50-12.5 MG tablet, Take 1 tablet by mouth  daily, Disp: 90 tablet, Rfl: 3 .  naproxen sodium (ANAPROX) 550 MG tablet, Take 550 mg by mouth 2 (two) times daily with a meal. Reported on 05/23/2016, Disp: , Rfl:  .  triamcinolone (NASACORT) 55 MCG/ACT AERO nasal inhaler, Place 2 sprays into the nose daily. Reported on 05/23/2016, Disp: , Rfl:  .  valACYclovir (VALTREX) 500 MG tablet, Take 500 mg by mouth 3 (three) times daily as needed. , Disp: , Rfl:  .  zaleplon (SONATA) 10 MG capsule, Take 10 mg by mouth at bedtime as needed for sleep., Disp: , Rfl:     ALLERGIES   Cephalosporins;  Omnicef [cefdinir]; Penicillins; and Adhesive [tape]     REVIEW OF SYSTEMS   Review of Systems  Constitutional: Negative for chills, fever, malaise/fatigue and weight loss.  Respiratory: Negative for cough, hemoptysis, sputum production, shortness of breath and wheezing.   Cardiovascular: Negative for chest pain, palpitations, orthopnea and leg swelling.  All other systems reviewed and are negative.    BP 136/88 (BP Location: Left Arm, Cuff Size: Large)   Pulse 94   Ht 5\' 4"  (1.626 m)   Wt 240 lb (108.9 kg)   SpO2 99%   BMI 41.20 kg/m     PHYSICAL EXAM  Physical Exam  Constitutional: She is oriented to person, place, and time. She appears well-developed and well-nourished. No distress.  Eyes: EOM are normal.  Neck: Neck supple.  Cardiovascular: Normal rate, regular rhythm and normal heart sounds.   No murmur heard. Pulmonary/Chest: Effort normal and breath sounds normal. No stridor. No respiratory distress. She has no wheezes.  Musculoskeletal: Normal range of motion. She exhibits no edema.  Neurological: She is alert and oriented to person, place, and time. No cranial nerve deficit.  Skin: She is not diaphoretic.             ASSESSMENT/PLAN   45 yo white female seen today for problems with  excessive daytime sleepiness and fatigue  Findings suggest OSA Will order sleep study ASAP  Follow up after sleep study completed  I have personally obtained a history, examined the patient, evaluated laboratory and independently reviewed imaging results, formulated the assessment and plan and placed orders.  The Patient requires high complexity decision making for assessment and support, frequent evaluation and titration of therapies, application of advanced monitoring technologies and extensive interpretation of multiple databases.   Patient satisfied with Plan of action and management. All questions answered  Corrin Parker, M.D.  [facility]  [facility]  Medical Director [place] Director [facility] Cardio-Pulmonary Department

## 2016-11-07 NOTE — Telephone Encounter (Signed)
Okay to take metoprolol tartrate to 12.5 mg up to 25 mg Twice a day Would monitor heart rate and blood pressure to make sure does not drop low

## 2016-11-07 NOTE — Telephone Encounter (Signed)
Patient is complaining of headache (0-10 is a 6/7) it started Monday 11/04/16 and is constant and won't go away. Please call patient.

## 2016-11-07 NOTE — Telephone Encounter (Signed)
Left voicemail message to call back  

## 2016-11-08 ENCOUNTER — Other Ambulatory Visit: Payer: Self-pay | Admitting: *Deleted

## 2016-11-08 DIAGNOSIS — G4733 Obstructive sleep apnea (adult) (pediatric): Secondary | ICD-10-CM

## 2016-11-08 MED ORDER — METOPROLOL TARTRATE 25 MG PO TABS: 12.5000 mg | ORAL_TABLET | Freq: Every day | ORAL | 3 refills | Status: DC

## 2016-11-08 NOTE — Telephone Encounter (Signed)
Left voicemail message to call back  

## 2016-11-08 NOTE — Telephone Encounter (Signed)
Left message for pt to call back  °

## 2016-11-08 NOTE — Telephone Encounter (Signed)
Spoke w/ pt.  Advised her of Dr. Donivan Scull recommendation. She reports that her BP has been running 136/88.  She reports that her head hurts every time she opens her eyes. Asked her to monitor her BP and call back if her sx do not improve.

## 2016-12-09 DIAGNOSIS — H9202 Otalgia, left ear: Secondary | ICD-10-CM | POA: Diagnosis not present

## 2017-01-01 DIAGNOSIS — H029 Unspecified disorder of eyelid: Secondary | ICD-10-CM | POA: Diagnosis not present

## 2017-01-07 ENCOUNTER — Encounter: Payer: Self-pay | Admitting: Internal Medicine

## 2017-01-07 DIAGNOSIS — G4733 Obstructive sleep apnea (adult) (pediatric): Secondary | ICD-10-CM

## 2017-01-08 ENCOUNTER — Telehealth: Payer: Self-pay | Admitting: *Deleted

## 2017-01-08 DIAGNOSIS — G4733 Obstructive sleep apnea (adult) (pediatric): Secondary | ICD-10-CM

## 2017-01-08 NOTE — Telephone Encounter (Signed)
LMOM for pt to return call back in regards to sleep study.

## 2017-01-09 NOTE — Telephone Encounter (Signed)
LM for pt to returncall

## 2017-01-09 NOTE — Telephone Encounter (Signed)
Pt informed of sleep study results. Order placed for CPAP.

## 2017-01-29 ENCOUNTER — Telehealth: Payer: Self-pay | Admitting: *Deleted

## 2017-01-29 NOTE — Telephone Encounter (Signed)
FYI message below from [facility].

## 2017-01-29 NOTE — Telephone Encounter (Signed)
-----   Message from [facility] sent at 01/29/2017 11:55 AM EST ----- This patient has declined CPAP set up at this time due to other health issues and her financial responsibility.  Let me know if you have any questions or if we can do anything else. Thanks [employer]

## 2017-02-17 DIAGNOSIS — J988 Other specified respiratory disorders: Secondary | ICD-10-CM | POA: Diagnosis not present

## 2017-02-17 DIAGNOSIS — M791 Myalgia: Secondary | ICD-10-CM | POA: Diagnosis not present

## 2017-02-18 ENCOUNTER — Telehealth: Payer: Self-pay | Admitting: Internal Medicine

## 2017-02-18 NOTE — Telephone Encounter (Signed)
Pt calling stating she [facility] to [facility] and was told that she have some upper repository issues, she states along with that she's having some  "gurgling sound" that she mentioned last time she was here to Dr Mortimer Fries. She was told she is having some "air way" issues by Walk in   Would like to know what to help with that, for she is to go work tomorrow

## 2017-02-18 NOTE — Telephone Encounter (Signed)
LMOM for pt to return call. 

## 2017-02-18 NOTE — Telephone Encounter (Signed)
Spoke with pt and she states she went to a walk in clinic and they told her she has a viral URI. States she is having a "gurgling sound" in her chest and wants to be seen. Called [facility] office for an appt for Wednesday. Called pt and gave her an appt for 02/19/17 @ 9am woth [place]. Pt agreed. Nothing further needed.

## 2017-02-19 ENCOUNTER — Ambulatory Visit (INDEPENDENT_AMBULATORY_CARE_PROVIDER_SITE_OTHER): Payer: 59 | Admitting: Pulmonary Disease

## 2017-02-19 ENCOUNTER — Telehealth: Payer: Self-pay | Admitting: Pulmonary Disease

## 2017-02-19 ENCOUNTER — Encounter: Payer: Self-pay | Admitting: Pulmonary Disease

## 2017-02-19 ENCOUNTER — Ambulatory Visit (INDEPENDENT_AMBULATORY_CARE_PROVIDER_SITE_OTHER)
Admission: RE | Admit: 2017-02-19 | Discharge: 2017-02-19 | Disposition: A | Discharge status: Home / Self Care | Payer: 59 | Source: Ambulatory Visit | Attending: Pulmonary Disease | Admitting: Pulmonary Disease

## 2017-02-19 ENCOUNTER — Encounter (INDEPENDENT_AMBULATORY_CARE_PROVIDER_SITE_OTHER): Payer: Self-pay

## 2017-02-19 VITALS — BP 132/78 | HR 91 | Ht 64.0 in | Wt 241.6 lb

## 2017-02-19 DIAGNOSIS — R0602 Shortness of breath: Secondary | ICD-10-CM | POA: Diagnosis not present

## 2017-02-19 DIAGNOSIS — R05 Cough: Secondary | ICD-10-CM | POA: Diagnosis not present

## 2017-02-19 MED ORDER — HYDROCOD POLST-CPM POLST ER 10-8 MG/5ML PO SUER: 5.0000 mL | Freq: Two times a day (BID) | ORAL | 0 refills | Status: DC | PRN

## 2017-02-19 MED ORDER — ALBUTEROL SULFATE HFA 108 (90 BASE) MCG/ACT IN AERS: 2.0000 | INHALATION_SPRAY | Freq: Four times a day (QID) | RESPIRATORY_TRACT | 6 refills | Status: DC | PRN

## 2017-02-19 NOTE — Telephone Encounter (Signed)
Pt calling asking if we can please call her with the results on her Xray she did this morning.  Please advise

## 2017-02-19 NOTE — Telephone Encounter (Signed)
Pt is scheduled with Dr Vaughan Browner this morning 02/19/17 at 9:15.  Symptoms will be addressed at that time.  Will hold in triage to ensure patient is seen today otherwise triage will need to call and follow up with patient.

## 2017-02-19 NOTE — Telephone Encounter (Signed)
Pt calling to get a diag for some disability forms as well as get results of chest x-rays she has called this office several times and Dr Vaughan Browner says that she needs to speak w/ Dr Zoila Shutter office says she needs the diag by 5.00.Hillery Hunter

## 2017-02-19 NOTE — Telephone Encounter (Signed)
Called by Ashley Floyd who reports in SOB, cough and wheezing. She spoke with Dr. Mortimer Fries earlier this evening. Dr. Mortimer Fries was going to call in a prescription for Prednisone and an antibiotic, however, he didn.t think that any pharmacy would be open. Ashley Floyd has found a 24 hour pharmacy and requests prescriptions for Prednisone and an antibiotic. If he breathing was difficult, I recommended that she go to the Emergency Department. However, she didn't want to go the Emergency Department tonight and that she stated that she had an appointment with [facility] in [facility] (Dr Vaughan Browner)  in the morning. Will call in prescriptions for Azithromycin 250 mg, #5, Sig: take 2 tablets the first day, then 1 tablet per day for 4 days and 2. Prednisone 10 mg, #20, Sig: take 4 tablets per day for 2 days, then 3 tablets per day for 2 days, then 2 tablets per day for 2 days and finally, 1 tablet per day for 2 days.

## 2017-02-19 NOTE — Telephone Encounter (Signed)
Please advise on CXR results from this am. Pt called yesterday and wanted to be seen so I got her an appt at [facility] and now Dr. Vaughan Browner is saying she needs to call here for the results.

## 2017-02-19 NOTE — Patient Instructions (Addendum)
We'll give you tussionex to help with the cough Continue the Mucinex, Delsym for cough Continue the azithromycin and prednisone taper Give albuterol rescue inhaler Schedule for chest x ray  Follow up with Dr. Mortimer Fries.

## 2017-02-19 NOTE — Progress Notes (Signed)
Ashley Floyd    [id number]    February 28, 1971  Primary Care Physician:No PCP Per Patient  Referring Physician: No referring provider defined for this encounter.  Chief complaint:  Acute visit for cough, dyspnea, wheezing  HPI: 46 year old with past medical history of sleep apnea, anxiety, hypertension, bipolar disorder. She has complains of cough with dyspnea, wheezing, sneezing, sinus congestion for the past 6 days. She reports exposure to sick contacts at work. She went to urgent care and had a flu test which was negative. She is given Tessalon which did not help with her symptoms. She called the on-call doctor last night and was started on azithromycin and prednisone taper that she started at 2 AM today. She has difficulty sleeping at night and is requesting something to help her fall asleep. She is a lifelong nonsmoker with no known exposures.  Outpatient Encounter Prescriptions as of 02/19/2017  Medication Sig  . benzonatate (TESSALON) 200 MG capsule Take 200 mg by mouth 3 (three) times daily as needed for cough.  . carbamazepine (TEGRETOL) 200 MG tablet Take 600 mg by mouth at bedtime.   . clonazePAM (KLONOPIN) 1 MG tablet Take 2 mg by mouth at bedtime.   Marland Kitchen dexlansoprazole (DEXILANT) 60 MG capsule Take 1 capsule (60 mg total) by mouth daily.  . diphenhydrAMINE (BENADRYL) 25 MG tablet Take 25 mg by mouth at bedtime as needed. Reported on 05/23/2016  . losartan-hydrochlorothiazide (HYZAAR) 50-12.5 MG tablet Take 1 tablet by mouth  daily  . naproxen sodium (ANAPROX) 550 MG tablet Take 550 mg by mouth 2 (two) times daily with a meal. Reported on 05/23/2016  . triamcinolone (NASACORT) 55 MCG/ACT AERO nasal inhaler Place 2 sprays into the nose daily. Reported on 05/23/2016  . valACYclovir (VALTREX) 500 MG tablet Take 500 mg by mouth 3 (three) times daily as needed.   . zaleplon (SONATA) 10 MG capsule Take 10 mg by mouth daily.   . hydrochlorothiazide (HYDRODIURIL) 25 MG tablet Take 1  tablet (25 mg total) by mouth daily as needed.  . metoprolol tartrate (LOPRESSOR) 25 MG tablet Take 0.5 tablets (12.5 mg total) by mouth daily. (Patient taking differently: Take 12.5 mg by mouth as needed. )   No facility-administered encounter medications on file as of 02/19/2017.     Allergies as of 02/19/2017 - Review Complete 02/19/2017  Allergen Reaction Noted  . Cephalosporins Rash 03/22/2015  . Omnicef [cefdinir]  08/29/2015  . Penicillins Other (See Comments) 03/22/2015  . Adhesive [tape] Rash 11/30/2015    Past Medical History:  Diagnosis Date  . Anxiety    a. per epic care everywhere & patient  . Bipolar affective disorder ([place])   . Complication of anesthesia    PT STATES SHE HAS A SMALL MOUTH AND HAS HAD DENTAL WORK ON HER 2 FRONT TEETH AND SHE WANTS EXTRA CARE TAKEN FOR HER TEETH  . Depression   . Dysmenorrhea   . Endometriosis   . Family history of adverse reaction to anesthesia    PTS MOM HAS TROUBLE WITH ANESTHESIA DUE TO HER SMOKING HISTORY  . GERD (gastroesophageal reflux disease)   . Hemorrhoid   . Herpes genitalis   . History of nuclear stress test    a. 08/2015: no EKG changes concerning for ischemia, normal wall motion, EF >60%, low risk study  . Hypertension   . IBS (irritable bowel syndrome)   . Obesity   . Ovarian cyst 1998   right  .  PONV (postoperative nausea and vomiting)   . Shortness of breath dyspnea   . Sinus infection   . Sleep apnea    "MILD"-NO CPAP-LAST SLEEP STUDY YEARS AGO    Past Surgical History:  Procedure Laterality Date  . CHOLECYSTECTOMY N/A 12/07/2015   Procedure: LAPAROSCOPIC CHOLECYSTECTOMY ;  Surgeon: Robert Bellow, MD;  Location: [facility] ORS;  Service: General;  Laterality: N/A;  . OVARIAN CYST REMOVAL    . UPPER GI ENDOSCOPY  2015   Dr Allen Norris  . URETHRAL DILATION      Family History  Problem Relation Age of Onset  . Arrhythmia Mother   . Hypertension Maternal Grandmother   . Hypertension Maternal Grandfather   .  Heart disease Paternal Grandmother   . Heart attack Paternal Grandmother   . Heart failure Maternal Aunt   . Heart disease Maternal Aunt     CABG    Social History   Social History  . Marital status: Married    Spouse name: N/A  . Number of children: N/A  . Years of education: N/A   Occupational History  . Not on file.   Social History Main Topics  . Smoking status: Never Smoker  . Smokeless tobacco: Never Used  . Alcohol use 0.0 oz/week     Comment: rarely  . Drug use: No  . Sexual activity: Not on file   Other Topics Concern  . Not on file   Social History Narrative  . No narrative on file    Review of systems: Review of Systems  Constitutional: Negative for fever and chills.  HENT: Negative.   Eyes: Negative for blurred vision.  Respiratory: as per HPI  Cardiovascular: Negative for chest pain and palpitations.  Gastrointestinal: Negative for vomiting, diarrhea, blood per rectum. Genitourinary: Negative for dysuria, urgency, frequency and hematuria.  Musculoskeletal: Negative for myalgias, back pain and joint pain.  Skin: Negative for itching and rash.  Neurological: Negative for dizziness, tremors, focal weakness, seizures and loss of consciousness.  Endo/Heme/Allergies: Negative for environmental allergies.  Psychiatric/Behavioral: Negative for depression, suicidal ideas and hallucinations.  All other systems reviewed and are negative.  Physical Exam: Blood pressure 132/78, pulse 91, height 5\' 4"  (1.626 m), weight 241 lb 9.6 oz (109.6 kg), SpO2 95 %. Gen:      No acute distress HEENT:  EOMI, sclera anicteric Neck:     No masses; no thyromegaly Lungs:    Scattered crackles; normal respiratory effort CV:         Regular rate and rhythm; no murmurs Abd:      + bowel sounds; soft, non-tender; no palpable masses, no distension Ext:    No edema; adequate peripheral perfusion Skin:      Warm and dry; no rash Neuro: alert and oriented x 3 Psych: normal mood and  affect  Data Reviewed: CT chest 11/14/15- 3 mm nodule in the right middle lobe. Resolution of left lower lobe groundglass nodule. I have reviewed the images personally.  Assessment:  Acute bronchitis with reactive airways Ashley Floyd has acute bronchitis with dyspnea, wheezing secondary to reactive airways. Flu has been ruled out at urgent care earlier this week. She has started today on azithromycin and prednisone taper today AM. I'll get a chest x-ray to rule out any infiltrate and given her an albuterol inhaler to be used temporarily while she is getting all this acute episode She'll use Mucinex and Delsym over-the-counter. I'll give her some Tussionex to help with the cough.  Plan/Recommendations: -  Continue azithromycin, prednisone taper - Tussionex for cough - CXR  Follow up with Dr. Ivan Croft MD [facility] [place] Pager [phone] 02/19/2017, 9:37 AM  CC: No ref. provider found

## 2017-02-21 NOTE — Telephone Encounter (Signed)
I don't see anything of concern on the CXR  [facility]

## 2017-02-24 NOTE — Telephone Encounter (Signed)
Pt informed of results. Nothing further needed. 

## 2017-02-27 ENCOUNTER — Telehealth: Payer: Self-pay | Admitting: Internal Medicine

## 2017-02-27 NOTE — Telephone Encounter (Signed)
appt scheduled.  Nothing further needed.  

## 2017-02-27 NOTE — Telephone Encounter (Signed)
Patient is still not feeling well and wants to be seen asap for bronchitis .  Please advise

## 2017-02-28 ENCOUNTER — Ambulatory Visit (INDEPENDENT_AMBULATORY_CARE_PROVIDER_SITE_OTHER): Payer: 59 | Admitting: Pulmonary Disease

## 2017-02-28 ENCOUNTER — Encounter: Payer: Self-pay | Admitting: Pulmonary Disease

## 2017-02-28 ENCOUNTER — Encounter: Payer: Self-pay | Admitting: *Deleted

## 2017-02-28 VITALS — BP 132/86 | HR 116 | Wt 239.0 lb

## 2017-02-28 DIAGNOSIS — J069 Acute upper respiratory infection, unspecified: Secondary | ICD-10-CM | POA: Diagnosis not present

## 2017-02-28 DIAGNOSIS — J209 Acute bronchitis, unspecified: Secondary | ICD-10-CM | POA: Diagnosis not present

## 2017-02-28 DIAGNOSIS — R0600 Dyspnea, unspecified: Secondary | ICD-10-CM

## 2017-02-28 DIAGNOSIS — R0609 Other forms of dyspnea: Secondary | ICD-10-CM | POA: Diagnosis not present

## 2017-02-28 MED ORDER — PREDNISONE 20 MG PO TABS: 40.0000 mg | ORAL_TABLET | Freq: Every day | ORAL | 0 refills | Status: AC

## 2017-02-28 MED ORDER — DOXYCYCLINE HYCLATE 100 MG PO TABS: 100.0000 mg | ORAL_TABLET | Freq: Two times a day (BID) | ORAL | 0 refills | Status: DC

## 2017-02-28 MED ORDER — HYDROCOD POLST-CPM POLST ER 10-8 MG/5ML PO SUER: 5.0000 mL | Freq: Two times a day (BID) | ORAL | 0 refills | Status: DC | PRN

## 2017-02-28 NOTE — Progress Notes (Signed)
ACUTE PULMONARY OFFICE VISIT  ACUTE PROBLEM: Ashley Floyd patient with 2 weeks of wheezing, cough, SOB  CHRONIC PULMONARY PROBLEMS: Very mild OSA Small RML nodule  SUBJ: Onset of illness 03/23 with cough, fever, myalgias, chest congestion. See in urgent care center in [facility] 03/26 and told she probably has a viral infection. Prescribed Tessalon Perles and nasal steroid. Called our office with increased SOB and prescribed Z pak by Ashley Floyd. Saw Dr Vaughan Browner 03/28 and albuterol MDI and Tussionex prescribed. A CXR @ that time revealed possible very small opacity behind heart shadow at the costo-vertebral angle on R. Here now with persistent symptoms of cough and chest congestion with SOB and nasal congestion. Her last fever was 4 yrs ago. Albuterol helps her symptoms but causes tremor. She requests more Tussionex as she is nearly done with that prescribed by Dr Vaughan Browner  OBJ: Vitals:   02/28/17 1049 02/28/17 1050  BP:  132/86  Pulse:  (!) 116  SpO2:  96%  Weight: 108.4 kg (239 lb)     Gen: NAD, nasal voice quality HEENT: All WNL Neck: NO LAN, no JVD noted Lungs: full BS, normal percussion note, rhonchi on R which partially clear with cough Cardiovascular: Reg rate, normal rhythm, no M noted Abdomen: Soft, NT +BS Ext: no C/C/E Neuro: CNs intact, motor/sens grossly intact Skin: No lesions noted  CXR 03/26: reviewed. Results as above  IMPRESSION: Acute bronchitis, unspecified organism - Plan: DG Chest 2 View  Upper respiratory tract infection, unspecified type  DOE (dyspnea on exertion) - Plan: DG Chest 2 View  PLAN:   Doxycyline 100 mg BID X 7 days Prednisone 40 mg daily X 5 Breo 100-25 sample provided - no Rx. To be used daily until gone Already has appointment with Ashley Floyd 05/04. Will order CXR to be performed prior to that visit  I have reviewed this patient's medical problems, current medications and therapies and prior pulmonary office notes in evaluation and formulation of the above  assessment and plan  Merton Border, MD PCCM service Mobile [phone] Pager [phone] 02/28/2017

## 2017-02-28 NOTE — Progress Notes (Signed)
Patient ID: Ashley Floyd, female   DOB: Dec 08, 1970, 46 y.o.   MRN: [id number]  Patient seen in the office today and instructed on use of Breo.  Patient expressed understanding and demonstrated technique.

## 2017-02-28 NOTE — Patient Instructions (Signed)
1) Doxycycline 100 mg twice a day for 7 days 2) Prednisone 40 mg daily (2 pills) for 5 days 3) Sample of Breo inhaler provided - one inhalation daily until gone.  4) if symptoms relapse after stopping Breo, call her and we will send a prescription  5) Keep your appointment with Dr Mortimer Fries on 05/04 - I have ordered a CXR to be done prior to that visit

## 2017-03-06 ENCOUNTER — Telehealth: Payer: Self-pay | Admitting: Pulmonary Disease

## 2017-03-06 NOTE — Telephone Encounter (Signed)
Pt states she is very tired and thinks she may be having a reaction from coming off the prednisone. Yesterday was the last day of the prednisone she was on and it wasn't a taper. Informed pt as far as her period or possible cyst rupture she needs to contact her PCP or GYN doctor.

## 2017-03-06 NOTE — Telephone Encounter (Signed)
Received VM from pt asking to call her back. Called back but no answer and unable to leave VM.

## 2017-03-06 NOTE — Telephone Encounter (Signed)
Spoke with pt and she was informed of response and was informed to make sure she is eating and staying hydrated. Nothing further needed at this time.

## 2017-03-06 NOTE — Telephone Encounter (Signed)
Pt calling stating she thinks she is having a reaction to [place] taking her off the steroids She finished taking them yesterday.   She states she hasn't had a period in 3 months and but yesterday she started. She thinks it may be a cyst that ruptured.  Today she is at work and very tired. She is not able to drive herself home.  She is just tried and feels exhausted  Please advise

## 2017-03-06 NOTE — Telephone Encounter (Signed)
Per DS pt is to be informed that after being on Prednisone like she has it is not abnormal to feel that way and to give it a few days.   LMOM for pt to return my call.

## 2017-03-24 ENCOUNTER — Encounter: Payer: Self-pay | Admitting: Obstetrics and Gynecology

## 2017-03-24 ENCOUNTER — Ambulatory Visit (INDEPENDENT_AMBULATORY_CARE_PROVIDER_SITE_OTHER): Payer: 59 | Admitting: Obstetrics and Gynecology

## 2017-03-24 VITALS — BP 150/110 | HR 102 | Ht 64.0 in | Wt 244.0 lb

## 2017-03-24 DIAGNOSIS — L900 Lichen sclerosus et atrophicus: Secondary | ICD-10-CM | POA: Insufficient documentation

## 2017-03-24 DIAGNOSIS — N946 Dysmenorrhea, unspecified: Secondary | ICD-10-CM

## 2017-03-24 DIAGNOSIS — Z1231 Encounter for screening mammogram for malignant neoplasm of breast: Secondary | ICD-10-CM

## 2017-03-24 DIAGNOSIS — Z01419 Encounter for gynecological examination (general) (routine) without abnormal findings: Secondary | ICD-10-CM | POA: Diagnosis not present

## 2017-03-24 DIAGNOSIS — I10 Essential (primary) hypertension: Secondary | ICD-10-CM | POA: Diagnosis not present

## 2017-03-24 DIAGNOSIS — N951 Menopausal and female climacteric states: Secondary | ICD-10-CM

## 2017-03-24 DIAGNOSIS — A6004 Herpesviral vulvovaginitis: Secondary | ICD-10-CM

## 2017-03-24 DIAGNOSIS — A6000 Herpesviral infection of urogenital system, unspecified: Secondary | ICD-10-CM | POA: Insufficient documentation

## 2017-03-24 DIAGNOSIS — Z1239 Encounter for other screening for malignant neoplasm of breast: Secondary | ICD-10-CM

## 2017-03-24 MED ORDER — VALACYCLOVIR HCL 500 MG PO TABS: 500.0000 mg | ORAL_TABLET | Freq: Two times a day (BID) | ORAL | 1 refills | Status: DC

## 2017-03-24 MED ORDER — NAPROXEN SODIUM 550 MG PO TABS: 550.0000 mg | ORAL_TABLET | Freq: Two times a day (BID) | ORAL | 1 refills | Status: DC

## 2017-03-24 NOTE — Progress Notes (Addendum)
Chief Complaint  Patient presents with  . Gynecologic Exam    THINKS SHE IS STARTING MENOPAUSE; RX REFILLS     HPI:      [name] is a 46 y.o. G0P0000 who LMP was Patient's last menstrual period was 03/02/2017., presents today for her annual examination.  Her menses are irregular now, Q1-4 months,  lasting 7 days.  Dysmenorrhea moderate, occurring first 1-2 days of flow. She does not have intermenstrual bleeding. She has a hx of endometriosis and ovarian cysts. She takes anaprox for cramps with relief.   Sex activity: single partner, contraception - hx of infertility. Last Pap: March 25, 2015  Results were: no abnormalities /neg HPV DNA  Hx of STDs: HSV,oral and genital and takes valtrex a few times a year prn sx.  Last mammogram: Apr 03, 2016 at [facility].  Results were: normal--routine follow-up in 12 months There is no FH of breast cancer. There is no FH of ovarian cancer. The patient does do self-breast exams.  Tobacco use: The patient denies current or previous tobacco use. Alcohol use: none Exercise: not active  She does not get adequate calcium and Vitamin D in her diet. She sees cardio, Dr. Rockey Situ, for HTN. She states she hasn't been taking her BP meds recently, especially while she was sick with bronchitis 3/18 and had to do 2 rounds of prednisone. She has been having increased headaches for the past few months.    Past Medical History:  Diagnosis Date  . Anxiety    a. per epic care everywhere & patient  . Bipolar affective disorder ([place])   . Complication of anesthesia    PT STATES SHE HAS A SMALL MOUTH AND HAS HAD DENTAL WORK ON HER 2 FRONT TEETH AND SHE WANTS EXTRA CARE TAKEN FOR HER TEETH  . Depression   . Dysmenorrhea   . Endometriosis   . Family history of adverse reaction to anesthesia    PTS MOM HAS TROUBLE WITH ANESTHESIA DUE TO HER SMOKING HISTORY  . GERD (gastroesophageal reflux disease)   . Hemorrhoid   . Herpes genitalis   . History of mammogram  03/22/2015   BI-RADS 1 AT [facility]  . History of nuclear stress test    a. 08/2015: no EKG changes concerning for ischemia, normal wall motion, EF >60%, low risk study  . History of Papanicolaou smear of cervix 03/08/2015   -/-  . Hypertension   . IBS (irritable bowel syndrome)   . Infertility, female    [facility] PT IN THE PAST  . Obesity   . Ovarian cyst 1998   right  . PMDD (premenstrual dysphoric disorder)   . PONV (postoperative nausea and vomiting)   . Shortness of breath dyspnea   . Sinus infection   . Sleep apnea    "MILD"-NO CPAP-LAST SLEEP STUDY YEARS AGO    Past Surgical History:  Procedure Laterality Date  . CHOLECYSTECTOMY N/A 12/07/2015   Procedure: LAPAROSCOPIC CHOLECYSTECTOMY ;  Surgeon: Robert Bellow, MD;  Location: [facility] ORS;  Service: General;  Laterality: N/A;  . DIAGNOSTIC LAPAROSCOPY  1988; 2006   OSIS; OVARIAN CYST (2006 AT [facility])  . OVARIAN CYST REMOVAL    . UPPER GI ENDOSCOPY  2015   Dr Allen Norris  . URETHRAL DILATION      Family History  Problem Relation Age of Onset  . Arrhythmia Mother   . Cancer Mother     MELANOMA OF SKIN  . Hypertension Maternal Grandmother   .  Hypertension Maternal Grandfather   . Heart disease Paternal Grandmother   . Heart attack Paternal Grandmother   . Diabetes Paternal Grandmother   . Diabetes Father   . Cancer Paternal Grandfather     MAL NEO OF SKIN; LUNGS  . Heart failure Maternal Aunt   . Heart disease Maternal Aunt     CABG  . Diabetes Paternal Aunt   . Diabetes Other     Social History   Social History  . Marital status: Legally Separated    Spouse name: N/A  . Number of children: 0  . Years of education: 49   Occupational History  . Not on file.   Social History Main Topics  . Smoking status: Never Smoker  . Smokeless tobacco: Never Used  . Alcohol use 0.0 oz/week     Comment: rarely  . Drug use: No  . Sexual activity: Yes    Birth control/ protection: None   Other Topics Concern  . Not on file    Social History Narrative  . No narrative on file     Current Outpatient Prescriptions:  .  carbamazepine (TEGRETOL) 200 MG tablet, Take 600 mg by mouth at bedtime. , Disp: , Rfl:  .  clonazePAM (KLONOPIN) 1 MG tablet, Take 2 mg by mouth at bedtime. , Disp: , Rfl:  .  dexlansoprazole (DEXILANT) 60 MG capsule, Take 1 capsule (60 mg total) by mouth daily., Disp: 90 capsule, Rfl: 3 .  diphenhydrAMINE (BENADRYL) 25 MG tablet, Take 25 mg by mouth at bedtime as needed. Reported on 05/23/2016, Disp: , Rfl:  .  losartan-hydrochlorothiazide (HYZAAR) 50-12.5 MG tablet, Take 1 tablet by mouth  daily, Disp: 90 tablet, Rfl: 3 .  naproxen sodium (ANAPROX) 550 MG tablet, Take 1 tablet (550 mg total) by mouth 2 (two) times daily with a meal. Prn sx, Disp: 60 tablet, Rfl: 1 .  triamcinolone (NASACORT) 55 MCG/ACT AERO nasal inhaler, Place 2 sprays into the nose daily. Reported on 05/23/2016, Disp: , Rfl:  .  valACYclovir (VALTREX) 500 MG tablet, Take 1 tablet (500 mg total) by mouth 2 (two) times daily. For 3 days prn sx, Disp: 30 tablet, Rfl: 1 .  zaleplon (SONATA) 10 MG capsule, Take 10 mg by mouth daily. , Disp: , Rfl:   ROS:  Review of Systems  Constitutional: Positive for malaise/fatigue. Negative for fever and weight loss.  HENT: Negative for congestion, ear pain and sinus pain.   Respiratory: Negative for cough, shortness of breath and wheezing.   Cardiovascular: Negative for chest pain, orthopnea and leg swelling.  Gastrointestinal: Positive for constipation, diarrhea and nausea. Negative for vomiting.  Genitourinary: Negative for dysuria, frequency, hematuria and urgency.       Breast ROS: negative   Musculoskeletal: Positive for joint pain. Negative for back pain and myalgias.  Skin: Negative for itching and rash.  Neurological: Positive for headaches. Negative for dizziness, tingling and focal weakness.  Endo/Heme/Allergies: Negative for environmental allergies. Does not bruise/bleed easily.   Psychiatric/Behavioral: Positive for depression. Negative for suicidal ideas. The patient is nervous/anxious. The patient does not have insomnia.     Objective: BP (!) 150/110   Pulse (!) 102   Ht 5\' 4"  (1.626 m)   Wt 244 lb (110.7 kg)   LMP 03/02/2017   BMI 41.88 kg/m    Physical Exam  Constitutional: She is oriented to person, place, and time. She appears well-developed and well-nourished.  Genitourinary: Vagina normal and uterus normal. No erythema or tenderness in  the vagina. No vaginal discharge found. Right adnexum does not display mass and does not display tenderness. Left adnexum does not display mass and does not display tenderness. Cervix does not exhibit motion tenderness or polyp. Uterus is not enlarged or tender.  Neck: Normal range of motion. No thyromegaly present.  Cardiovascular: Normal rate, regular rhythm and normal heart sounds.   No murmur heard. Pulmonary/Chest: Effort normal and breath sounds normal. Right breast exhibits no mass, no nipple discharge, no skin change and no tenderness. Left breast exhibits no mass, no nipple discharge, no skin change and no tenderness.  Abdominal: Soft. There is no tenderness. There is no guarding.  Musculoskeletal: Normal range of motion.  Neurological: She is alert and oriented to person, place, and time. No cranial nerve deficit.  Psychiatric: She has a normal mood and affect. Her behavior is normal.  Vitals reviewed.   Assessment/Plan: Encounter for annual routine gynecological examination  Screening for breast cancer - Pt to sched mammo at [facility]. - Plan: MM DIGITAL SCREENING BILATERAL  Herpes simplex vulvovaginitis - Rx RF valtrex to [facility]. F/u prn. - Plan: valACYclovir (VALTREX) 500 MG tablet  Dysmenorrhea - Rx RF anaprox to [facility].  - Plan: naproxen sodium (ANAPROX) 550 MG tablet  Perimenopause - F/u prn DUB.  Essential hypertension - Pt's BPs elevated. Discussed importance of restarting BP meds and f/u with cardio.  Copy of today's note sent to Dr. Rockey Situ.              GYN counsel mammography screening, menopause, adequate intake of calcium and vitamin D, diet and exercise     F/U  Return in about 1 year (around 03/24/2018).  Alicia B. Copland, PA-C 03/25/2017 2:38 PM

## 2017-03-28 ENCOUNTER — Ambulatory Visit: Payer: 59 | Admitting: Internal Medicine

## 2017-04-08 ENCOUNTER — Encounter: Payer: Self-pay | Admitting: Obstetrics and Gynecology

## 2017-04-08 DIAGNOSIS — Z1231 Encounter for screening mammogram for malignant neoplasm of breast: Secondary | ICD-10-CM | POA: Diagnosis not present

## 2017-04-10 ENCOUNTER — Encounter: Payer: Self-pay | Admitting: Obstetrics and Gynecology

## 2017-05-15 ENCOUNTER — Encounter: Payer: Self-pay | Admitting: Gastroenterology

## 2017-05-15 ENCOUNTER — Other Ambulatory Visit: Payer: Self-pay

## 2017-05-15 ENCOUNTER — Ambulatory Visit (INDEPENDENT_AMBULATORY_CARE_PROVIDER_SITE_OTHER): Payer: 59 | Admitting: Gastroenterology

## 2017-05-15 VITALS — BP 138/88 | HR 94 | Temp 98.1°F | Ht 64.0 in | Wt 231.0 lb

## 2017-05-15 DIAGNOSIS — G4700 Insomnia, unspecified: Secondary | ICD-10-CM | POA: Insufficient documentation

## 2017-05-15 DIAGNOSIS — K219 Gastro-esophageal reflux disease without esophagitis: Secondary | ICD-10-CM | POA: Insufficient documentation

## 2017-05-15 DIAGNOSIS — R634 Abnormal weight loss: Secondary | ICD-10-CM

## 2017-05-15 DIAGNOSIS — R197 Diarrhea, unspecified: Secondary | ICD-10-CM

## 2017-05-15 DIAGNOSIS — N809 Endometriosis, unspecified: Secondary | ICD-10-CM | POA: Insufficient documentation

## 2017-05-15 DIAGNOSIS — J309 Allergic rhinitis, unspecified: Secondary | ICD-10-CM | POA: Insufficient documentation

## 2017-05-15 DIAGNOSIS — IMO0001 Reserved for inherently not codable concepts without codable children: Secondary | ICD-10-CM | POA: Insufficient documentation

## 2017-05-15 DIAGNOSIS — R112 Nausea with vomiting, unspecified: Secondary | ICD-10-CM | POA: Diagnosis not present

## 2017-05-15 DIAGNOSIS — K589 Irritable bowel syndrome without diarrhea: Secondary | ICD-10-CM | POA: Insufficient documentation

## 2017-05-15 MED ORDER — CHOLESTYRAMINE LIGHT 4 G PO PACK: 4.0000 g | PACK | Freq: Four times a day (QID) | ORAL | 3 refills | Status: DC

## 2017-05-15 MED ORDER — ONDANSETRON HCL 4 MG PO TABS: 4.0000 mg | ORAL_TABLET | Freq: Four times a day (QID) | ORAL | 1 refills | Status: DC | PRN

## 2017-05-15 MED ORDER — DICYCLOMINE HCL 20 MG PO TABS: 20.0000 mg | ORAL_TABLET | Freq: Three times a day (TID) | ORAL | 3 refills | Status: DC

## 2017-05-15 NOTE — Progress Notes (Signed)
Gastroenterology Consultation  Referring Provider:     No ref. provider found Primary Care Physician:  Patient, No Pcp Per Primary Gastroenterologist:  Dr. Allen Norris     Reason for Consultation:     Diarrhea with nausea        HPI:   Ashley Floyd is a 46 y.o. y/o female referred for consultation & management of Diarrhea with nausea by Dr. Patient, No Pcp Per.  This patient comes today after seeing me back in March 2015 for heartburn.  The patient underwent an EGD at that time and did not have any Barrett's esophagus.  The patient was put on Dexilant at that time.  The patient had been having some shortness of breath and underwent esophageal pH testing at [facility].  The patient was told that her shortness of breath was unlikely due to her reflux.  The patient was then speaking to her chiropractor who suggested that her abdominal bloating and bandlike pain was causing her shortness of breath and that she should have her gallbladder taken out.  The patient had her gallbladder taken out which showed chronic cholecystitis and a stone in the gallbladder.  The patient states that some of his symptoms away but she has had diarrhea ever since.  The patient now reports the diarrhea is uncontrollable and it has stopped her from eating.  The patient has lost approximately 13 pounds in the last week due to the profuse diarrhea and nausea.  She states that she gets nauseated with even drinking water.  The patient also reports that she was told by the doctors at [facility] that she should stop taking Dexilant because of the risks associated with it and that she should go back on Prilosec.  The patient now reports that she has had uncontrollable heartburn on the Prilosec and she was doing much better on the Dexilant.  She denies any black stools or bloody stools.  Past Medical History:  Diagnosis Date  . Anxiety    a. per epic care everywhere & patient  . Bipolar affective disorder ([place])   . Complication of anesthesia    PT  STATES SHE HAS A SMALL MOUTH AND HAS HAD DENTAL WORK ON HER 2 FRONT TEETH AND SHE WANTS EXTRA CARE TAKEN FOR HER TEETH  . Depression   . Dysmenorrhea   . Endometriosis   . Family history of adverse reaction to anesthesia    PTS MOM HAS TROUBLE WITH ANESTHESIA DUE TO HER SMOKING HISTORY  . GERD (gastroesophageal reflux disease)   . Hemorrhoid   . Herpes genitalis   . History of mammogram 03/22/2015   BI-RADS 1 AT [facility]  . History of nuclear stress test    a. 08/2015: no EKG changes concerning for ischemia, normal wall motion, EF >60%, low risk study  . History of Papanicolaou smear of cervix 03/08/2015   -/-  . Hypertension   . IBS (irritable bowel syndrome)   . Infertility, female    [facility] PT IN THE PAST  . Obesity   . Ovarian cyst 1998   right  . PMDD (premenstrual dysphoric disorder)   . PONV (postoperative nausea and vomiting)   . Shortness of breath dyspnea   . Sinus infection   . Sleep apnea    "MILD"-NO CPAP-LAST SLEEP STUDY YEARS AGO    Past Surgical History:  Procedure Laterality Date  . CHOLECYSTECTOMY N/A 12/07/2015   Procedure: LAPAROSCOPIC CHOLECYSTECTOMY ;  Surgeon: Robert Bellow, MD;  Location: [facility] ORS;  Service: General;  Laterality: N/A;  . DIAGNOSTIC LAPAROSCOPY  1988; 2006   OSIS; OVARIAN CYST (2006 AT [facility])  . OVARIAN CYST REMOVAL    . UPPER GI ENDOSCOPY  2015   Dr Allen Norris  . URETHRAL DILATION      Prior to Admission medications   Medication Sig Start Date End Date Taking? Authorizing Provider  carbamazepine (TEGRETOL) 200 MG tablet Take 600 mg by mouth at bedtime.  01/23/15  Yes [provider]  clonazePAM (KLONOPIN) 1 MG tablet Take 2 mg by mouth at bedtime.  01/30/15  Yes [provider]  dexlansoprazole (DEXILANT) 60 MG capsule Take 1 capsule (60 mg total) by mouth daily. 08/05/16  Yes [name], MD  losartan-hydrochlorothiazide [facility]) 50-12.5 MG tablet Take 1 tablet by mouth  daily 08/01/16  Yes Gollan, Kathlene November, MD  naproxen  sodium (ANAPROX) 550 MG tablet Take 1 tablet (550 mg total) by mouth 2 (two) times daily with a meal. Prn sx 1/49/70  Yes Copland, Alicia B, PA-C  valACYclovir (VALTREX) 500 MG tablet Take 1 tablet (500 mg total) by mouth 2 (two) times daily. For 3 days prn sx 2/63/78  Yes Copland, Alicia B, PA-C  zaleplon (SONATA) 10 MG capsule Take 10 mg by mouth daily.    Yes [provider]  chlorpheniramine-HYDROcodone ([place]) 10-8 MG/5ML SUER  03/06/17   [provider]  cholestyramine light (PREVALITE) 4 g packet Take 1 packet (4 g total) by mouth 4 (four) times daily. 05/15/17   [name], MD  dicyclomine (BENTYL) 20 MG tablet Take 1 tablet (20 mg total) by mouth 3 (three) times daily before meals. 05/15/17   [name], MD  diphenhydrAMINE (BENADRYL) 25 MG tablet Take 25 mg by mouth at bedtime as needed. Reported on 05/23/2016    [provider]  ondansetron (ZOFRAN) 4 MG tablet Take 1 tablet (4 mg total) by mouth every 6 (six) hours as needed for nausea or vomiting. 05/15/17   [name], MD  PROAIR HFA 108 (90 Base) MCG/ACT inhaler INHALE 2 PUFFS INTO THE LUNGS EVERY 6 (SIX) HOURS AS NEEDED FOR WHEEZING OR SHORTNESS OF BREATH. 02/19/17   [provider]  triamcinolone (NASACORT) 55 MCG/ACT AERO nasal inhaler Place 2 sprays into the nose daily. Reported on 05/23/2016    [provider]    Family History  Problem Relation Age of Onset  . Arrhythmia Mother   . Cancer Mother        MELANOMA OF SKIN  . Hypertension Maternal Grandmother   . Hypertension Maternal Grandfather   . Heart disease Paternal Grandmother   . Heart attack Paternal Grandmother   . Diabetes Paternal Grandmother   . Diabetes Father   . Cancer Paternal Grandfather        MAL NEO OF SKIN; LUNGS  . Heart failure Maternal Aunt   . Heart disease Maternal Aunt        CABG  . Diabetes Paternal Aunt   . Diabetes Other      Social History  Substance Use Topics  . Smoking status: Never  Smoker  . Smokeless tobacco: Never Used  . Alcohol use 0.0 oz/week     Comment: rarely    Allergies as of 05/15/2017 - Review Complete 05/15/2017  Allergen Reaction Noted  . Cephalosporins Rash 03/22/2015  . Omnicef [cefdinir]  08/29/2015  . Penicillins Other (See Comments) 03/22/2015  . Adhesive [tape] Rash 11/30/2015    Review of Systems:    All systems reviewed  and negative except where noted in HPI.   Physical Exam:  BP 138/88   Pulse 94   Temp 98.1 F (36.7 C) (Oral)   Ht 5\' 4"  (1.626 m)   Wt 231 lb (104.8 kg)   BMI 39.65 kg/m  No LMP recorded. Patient is perimenopausal. Psych:  Alert and cooperative. Normal mood and affect. General:   Alert,  Well-developed, well-nourished, pleasant and cooperative in NAD Head:  Normocephalic and atraumatic. Eyes:  Sclera clear, no icterus.   Conjunctiva pink. Ears:  Normal auditory acuity. Nose:  No deformity, discharge, or lesions. Mouth:  No deformity or lesions,oropharynx pink & moist. Neck:  Supple; no masses or thyromegaly. Lungs:  Respirations even and unlabored.  Clear throughout to auscultation.   No wheezes, crackles, or rhonchi. No acute distress. Heart:  Regular rate and rhythm; no murmurs, clicks, rubs, or gallops. Abdomen:  Normal bowel sounds.  No bruits.  Soft, non-tender and non-distended without masses, hepatosplenomegaly or hernias noted.  No guarding or rebound tenderness.  Negative Carnett sign.   Rectal:  Deferred.  Msk:  Symmetrical without gross deformities.  Good, equal movement & strength bilaterally. Pulses:  Normal pulses noted. Extremities:  No clubbing or edema.  No cyanosis. Neurologic:  Alert and oriented x3;  grossly normal neurologically. Skin:  Intact without significant lesions or rashes.  No jaundice. Lymph Nodes:  No significant cervical adenopathy. Psych:  Alert and cooperative. Normal mood and affect.  Imaging Studies: No results found.  Assessment and Plan:   ZYNIAH FERRAIOLO is a 46 y.o.  y/o female who comes here today with a report of diarrhea that has gotten worse recently.  The patient states the diarrhea has been present since her gallbladder was taken out.  Previous to that the patient had a diagnosis of irritable bowel syndrome and had alternating diarrhea and constipation but now only has diarrhea.  The patient also reports that she has had weight loss because she is unable to eat because of nausea.  The patient will be started on Zofran 4 mg to be taken every 6 hours as needed. The patient will also restart her Dexilant and stop the omeprazole since she has had considerable acid reflux while on the omeprazole that was well controlled on the [place]. The patient will also be set up for a colonoscopy to look for another source of her diarrhea while she is started on cholestyramine powder.  The patient will also be started on dicyclomine for her abdominal pain.  She will have her blood sends her for celiac sprue antibodies.  The patient has been explained the plan and agrees with it.  [name], MD. Marval Regal   Note: This dictation was prepared with Dragon dictation along with smaller phrase technology. Any transcriptional errors that result from this process are unintentional.

## 2017-05-16 LAB — GLIA (IGA/G) + TTG IGA
Antigliadin Abs, IgA: 6 units (ref 0–19)
Gliadin IgG: 2 units (ref 0–19)
Transglutaminase IgA: <2 U/mL (ref 0–3)

## 2017-05-27 ENCOUNTER — Telehealth: Payer: Self-pay

## 2017-05-27 DIAGNOSIS — J988 Other specified respiratory disorders: Secondary | ICD-10-CM | POA: Diagnosis not present

## 2017-05-27 NOTE — Telephone Encounter (Signed)
Left vm letting pt know celiac panel lab was negative.

## 2017-05-27 NOTE — Telephone Encounter (Signed)
-----   Message from [name], MD sent at 05/27/2017  8:51 AM EDT ----- Let the patient know that her celiac sprue panel was negative.

## 2017-06-03 DIAGNOSIS — R070 Pain in throat: Secondary | ICD-10-CM | POA: Diagnosis not present

## 2017-06-03 DIAGNOSIS — B379 Candidiasis, unspecified: Secondary | ICD-10-CM | POA: Diagnosis not present

## 2017-06-03 DIAGNOSIS — J039 Acute tonsillitis, unspecified: Secondary | ICD-10-CM | POA: Diagnosis not present

## 2017-06-09 ENCOUNTER — Ambulatory Visit: Payer: 59 | Admitting: Gastroenterology

## 2017-06-09 DIAGNOSIS — R05 Cough: Secondary | ICD-10-CM | POA: Diagnosis not present

## 2017-06-09 DIAGNOSIS — R070 Pain in throat: Secondary | ICD-10-CM | POA: Diagnosis not present

## 2017-06-09 DIAGNOSIS — B379 Candidiasis, unspecified: Secondary | ICD-10-CM | POA: Diagnosis not present

## 2017-07-14 ENCOUNTER — Other Ambulatory Visit: Payer: Self-pay | Admitting: Gastroenterology

## 2017-07-14 ENCOUNTER — Other Ambulatory Visit: Payer: Self-pay | Admitting: Cardiovascular Disease

## 2017-11-07 DIAGNOSIS — J039 Acute tonsillitis, unspecified: Secondary | ICD-10-CM | POA: Diagnosis not present

## 2017-12-22 DIAGNOSIS — J019 Acute sinusitis, unspecified: Secondary | ICD-10-CM | POA: Diagnosis not present

## 2018-01-15 ENCOUNTER — Other Ambulatory Visit: Payer: Self-pay | Admitting: Cardiovascular Disease

## 2018-01-15 ENCOUNTER — Other Ambulatory Visit: Payer: Self-pay | Admitting: Gastroenterology

## 2018-01-15 ENCOUNTER — Telehealth: Payer: Self-pay | Admitting: Cardiovascular Disease

## 2018-01-15 NOTE — Telephone Encounter (Signed)
-----   Message from Janan Ridge, [place] sent at 01/15/2018  8:26 AM EST ----- Regarding: Patient needs an appointment  Can you please try to schedule an appointment with Dr. Rockey Situ. Patient was Last seen 10/2016. Thank you!

## 2018-01-15 NOTE — Telephone Encounter (Signed)
lmov to call office.

## 2018-01-19 ENCOUNTER — Encounter: Payer: Self-pay | Admitting: Cardiovascular Disease

## 2018-01-19 NOTE — Telephone Encounter (Signed)
Mailbox full/will sent letter

## 2018-01-28 ENCOUNTER — Telehealth: Payer: Self-pay | Admitting: Gastroenterology

## 2018-01-28 NOTE — Telephone Encounter (Signed)
I scheduled patient for a FMLA undate and stomach issues. Her paperwork runs out 4/2 and needs to talk to you about it.

## 2018-02-03 NOTE — Telephone Encounter (Signed)
LVM for pt to return my call.

## 2018-02-11 NOTE — Telephone Encounter (Signed)
Left vm again for pt to return my call regarding FMLA paperwork.

## 2018-02-13 NOTE — Progress Notes (Signed)
Cardiology Office Note  Date:  02/17/2018   ID:  Ashley Floyd, DOB 1971/07/03, MRN [id number]  PCP:  Patient, No Pcp Per   Chief Complaint  Patient presents with  . other    Last seen 11/05/2016. Meds reviewed by the pt. verbally. Pt. c/o bad headaches and fluttering in chest.     HPI:  Ashley Floyd is a 47 year old woman with  obesity,  bipolar disorder, depression,   Previously evaluated for blood pressure and hypertension, shortness of breath She reports weight gain, dietary indiscretion. Significant family history of diabetes. Laparoscopic cholecystectomy, chronic diarrhea She presents today for follow-up of her shortness of breath, hypertension Considerable cardiac workup in the past including echocardiogram, stress test, chest CT scan  In follow-up today reports having several recent viral infections Some headaches, sinus pressure  Weight dropping 40 pounds over the past year Eating less, chronic nausea, diarrhea Managed by GI Is tried several medications, no significant improvement  At [employer] 29 years Lots of stress at work contributing to headaches and high blood pressure Does not have blood pressure cuff at home  No regular exercise, chronic shortness of breath, Previously did not tolerate torsemide, bottomed out her blood pressure She is tolerating low-dose HCTZ  Occasionally has palpitations, tachycardia Previously tried metoprolol, did not want to stay on the medication secondary to potential side effects  EKG personally reviewed by myself on todays visit Shows normal sinus rhythm rate 94 bpm no significant ST or T wave changes  Previously noted to have lung nodule on CT scan This resolved on repeat CT scan 3 months later  Cholecystectomy surgery January 2017 was successful   Other past medical history high blood pressure noted fall of 2015 , 141/100. Started on HCTZ 25 mg daily. Did not seem to help her blood pressure. changed to losartan HCTZ 50/12.5  mill grams daily. Blood pressure improved. She started having palpitations felt in her face, commonly at nighttime. Started on metoprolol 25 mg.  This helped both headaches and palpitations. stopped out of concern for diabetes   weight gain, Drinks [facility] through the day, fast food, no exercise.  lab work October 2015 showing normal BMP, low vitamin D   PMH:   has a past medical history of Anxiety, Bipolar affective disorder ([place]), Complication of anesthesia, Depression, Dysmenorrhea, Endometriosis, Family history of adverse reaction to anesthesia, GERD (gastroesophageal reflux disease), Hemorrhoid, Herpes genitalis, History of mammogram (03/22/2015), History of nuclear stress test, History of Papanicolaou smear of cervix (03/08/2015), Hypertension, IBS (irritable bowel syndrome), Infertility, female, Obesity, Ovarian cyst (1998), PMDD (premenstrual dysphoric disorder), PONV (postoperative nausea and vomiting), Shortness of breath dyspnea, Sinus infection, and Sleep apnea.  PSH:    Past Surgical History:  Procedure Laterality Date  . CHOLECYSTECTOMY N/A 12/07/2015   Procedure: LAPAROSCOPIC CHOLECYSTECTOMY ;  Surgeon: Robert Bellow, MD;  Location: [facility] ORS;  Service: General;  Laterality: N/A;  . DIAGNOSTIC LAPAROSCOPY  1988; 2006   OSIS; OVARIAN CYST (2006 AT [facility])  . OVARIAN CYST REMOVAL    . UPPER GI ENDOSCOPY  2015   Dr Allen Norris  . URETHRAL DILATION      Current Outpatient Medications  Medication Sig Dispense Refill  . carbamazepine (TEGRETOL) 200 MG tablet Take 600 mg by mouth at bedtime.     . cholestyramine light (PREVALITE) 4 g packet Take 1 packet (4 g total) by mouth 4 (four) times daily. 60 packet 3  . clonazePAM (KLONOPIN) 1 MG tablet Take 2 mg by mouth at  bedtime.     Marland Kitchen DEXILANT 60 MG capsule TAKE 1 CAPSULE BY MOUTH  DAILY 90 capsule 1  . dicyclomine (BENTYL) 20 MG tablet Take 1 tablet (20 mg total) by mouth 3 (three) times daily before meals. 90 tablet 3  .  diphenhydrAMINE (BENADRYL) 25 MG tablet Take 25 mg by mouth at bedtime as needed. Reported on 05/23/2016    . losartan-hydrochlorothiazide (HYZAAR) 100-12.5 MG tablet Take 1 tablet by mouth daily.    . naproxen sodium (ANAPROX) 550 MG tablet Take 1 tablet (550 mg total) by mouth 2 (two) times daily with a meal. Prn sx 60 tablet 1  . triamcinolone (NASACORT) 55 MCG/ACT AERO nasal inhaler Place 2 sprays into the nose daily. Reported on 05/23/2016    . valACYclovir (VALTREX) 500 MG tablet Take 1 tablet (500 mg total) by mouth 2 (two) times daily. For 3 days prn sx 30 tablet 1  . zaleplon (SONATA) 10 MG capsule Take 10 mg by mouth daily.      No current facility-administered medications for this visit.      Allergies:   Cephalosporins; Cefdinir; Penicillins; and Adhesive [tape]   Social History:  The patient  reports that she has never smoked. She has never used smokeless tobacco. She reports that she drinks alcohol. She reports that she does not use drugs.   Family History:   family history includes Arrhythmia in her mother; Cancer in her mother and paternal grandfather; Diabetes in her father, other, paternal aunt, and paternal grandmother; Heart attack in her paternal grandmother; Heart disease in her maternal aunt and paternal grandmother; Heart failure in her maternal aunt; Hypertension in her maternal grandfather and maternal grandmother.    Review of Systems: Review of Systems  Constitutional: Negative.   Respiratory: Positive for shortness of breath.   Cardiovascular: Positive for palpitations.  Gastrointestinal: Negative.   Musculoskeletal: Negative.   Neurological: Positive for headaches.  Psychiatric/Behavioral: Negative.   All other systems reviewed and are negative.    PHYSICAL EXAM: VS:  BP (!) 142/98 (BP Location: Left Arm, Patient Position: Sitting, Cuff Size: Normal)   Pulse 94   Ht 5\' 4"  (1.626 m)   Wt 209 lb 8 oz (95 kg)   BMI 35.96 kg/m  , BMI Body mass index is  35.96 kg/m. Constitutional:  oriented to person, place, and time. No distress.  HENT:  Head: Normocephalic and atraumatic.  Eyes:  no discharge. No scleral icterus.  Neck: Normal range of motion. Neck supple. No JVD present.  Cardiovascular: Normal rate, regular rhythm, normal heart sounds and intact distal pulses. Exam reveals no gallop and no friction rub. No edema No murmur heard. Pulmonary/Chest: Effort normal and breath sounds normal. No stridor. No respiratory distress.  no wheezes.  no rales.  no tenderness.  Abdominal: Soft.  no distension.  no tenderness.  Musculoskeletal: Normal range of motion.  no  tenderness or deformity.  Neurological:  normal muscle tone. Coordination normal. No atrophy Skin: Skin is warm and dry. No rash noted. not diaphoretic.  Psychiatric:  normal mood and affect. behavior is normal. Thought content normal.      Recent Labs: No results found for requested labs within last 8760 hours.    Lipid Panel No results found for: CHOL, HDL, LDLCALC, TRIG    Wt Readings from Last 3 Encounters:  02/17/18 209 lb 8 oz (95 kg)  05/15/17 231 lb (104.8 kg)  03/24/17 244 lb (110.7 kg)       ASSESSMENT AND  PLAN:  Essential hypertension - She is concerned about high blood pressure Recommended she buy blood pressure cuff and monitor at home and call us with numbers Hypertension in the setting of headaches, suspect headaches contributing to high pressure if baseline numbers are normal Suggested she take hydralazine 25 up to 50 mg tab as needed for hypertension greater than 160 if she is concerned  SOB (shortness of breath) -  Likely secondary to obesity and deconditioning No further ischemic workup needed Suspect symptoms will improve with 40 pound weight loss as she has done Stressed importance of regular walking program She has appointment with pulmonary today  Morbid obesity ([place]) - Weight down 40 pounds, reports having diarrhea GI problems  OSA  (obstructive sleep apnea) - Plan: AMB referral to pulmonary rehabilitation Managed by Dr. Mortimer Fries  Physical deconditioning -  Recommended a structured exercise program for breathing, conditioning.   Preventive care Discussed preventive screening, lab work She does not have primary care Long discussion concerning need to establish primary care physician, numbers provided We will order lab work for today including base metabolic panel, lipids, LFTs   Total encounter time more than 45 minutes  Greater than 50% was spent in counseling and coordination of care with the patient   Disposition:   F/U  12 months prn   Orders Placed This Encounter  Procedures  . EKG 12-Lead     Signed, Esmond Plants, M.D., Ph.D. 02/17/2018  [place], [place]

## 2018-02-17 ENCOUNTER — Encounter: Payer: Self-pay | Admitting: Cardiovascular Disease

## 2018-02-17 ENCOUNTER — Ambulatory Visit: Payer: 59 | Admitting: Cardiovascular Disease

## 2018-02-17 ENCOUNTER — Encounter: Payer: Self-pay | Admitting: Internal Medicine

## 2018-02-17 ENCOUNTER — Ambulatory Visit (INDEPENDENT_AMBULATORY_CARE_PROVIDER_SITE_OTHER): Payer: 59 | Admitting: Internal Medicine

## 2018-02-17 VITALS — BP 142/98 | HR 104 | Ht 64.0 in | Wt 209.8 lb

## 2018-02-17 DIAGNOSIS — R600 Localized edema: Secondary | ICD-10-CM | POA: Diagnosis not present

## 2018-02-17 DIAGNOSIS — I10 Essential (primary) hypertension: Secondary | ICD-10-CM | POA: Diagnosis not present

## 2018-02-17 DIAGNOSIS — R0602 Shortness of breath: Secondary | ICD-10-CM | POA: Diagnosis not present

## 2018-02-17 DIAGNOSIS — J45909 Unspecified asthma, uncomplicated: Secondary | ICD-10-CM

## 2018-02-17 DIAGNOSIS — R0789 Other chest pain: Secondary | ICD-10-CM

## 2018-02-17 MED ORDER — HYDRALAZINE HCL 25 MG PO TABS: 25.0000 mg | ORAL_TABLET | Freq: Three times a day (TID) | ORAL | 6 refills | Status: DC | PRN

## 2018-02-17 NOTE — Patient Instructions (Addendum)
Medication Instructions:   Please try the hydralazine 1 to 2 pills as needed for headache, high blood pressure  Labwork:  No new labs needed  Testing/Procedures:  BMP, lipids, LFTs through  [facility]  Paper to go   Follow-Up: It was a pleasure seeing you in the office today. Please call us if you have new issues that need to be addressed before your next appt.  [phone]  Your physician wants you to follow-up in: 12 months as needed.  You will receive a reminder letter in the mail two months in advance. If you don't receive a letter, please call our office to schedule the follow-up appointment.  If you need a refill on your cardiac medications before your next appointment, please call your pharmacy.  For educational health videos Log in to : [email or web address] Or : [email or web address], password : [email or web address]

## 2018-02-17 NOTE — Patient Instructions (Signed)
AVOID TV AT NIGHT  Call us if you need inhalers

## 2018-02-17 NOTE — Progress Notes (Signed)
PULMONARY OFFICE VISIT CHRONIC PULMONARY PROBLEMS: OSA Small RML nodule   CC follow up nasal congestion Follow up mild OSA Follow up reactive airways disease   HPI Patient doing well with resp symptoms No signs of infection at this time Poor sleep habits noted Has tried multiple meds forr insomnia I have advised to stop watching TV at night as first step Lost 40 pounds in 4 months from diarrhea Not very active person    Review of Systems: No SOB, DOE No wheezing Other:  All other systems negative  OBJ: BP (!) 142/98 (BP Location: Left Arm, Cuff Size: Normal)   Pulse (!) 104   Ht 5\' 4"  (1.626 m)   Wt 209 lb 12.8 oz (95.2 kg)   SpO2 94%   BMI 36.01 kg/m   Gen: NAD, nasal voice quality HEENT: All WNL Neck: NO LAN, no JVD noted Lungs: full BS, normal percussion note, rhonchi on R which partially clear with cough Cardiovascular: Reg rate, normal rhythm, no M noted Abdomen: Soft, NT +BS Ext: no C/C/E Neuro: CNs intact, motor/sens grossly intact Skin: No lesions noted  CXR 03/26: reviewed. Results as above  IMPRESSION: 47 yo WF previously seen by me in 2017 for symptoms of OSA, but has seen 2018 Dr Alva Garnet for acute viral bronchitis now with fatigue and excessive daytime sleepiness  Patient does noT have any acute resp symptoms at this time and does NOT need any inhalers at this time or prednisone at this time  She has asked me to document that patient only responds well to prednisone taper-60-50-40-30-20-10 mg If she needs prednisone therapy  Her OSA is mild and decided not to get treated with CPAP She needs to lose more weight and start exercising She has very poor sleep hygeine and advised her to stop watching TV at night  And read a book in low light and sound machine  No acute infection at this time I advised watching diet and start exercising to lose weight    Patient satisfied with Plan of action and management. All questions answered Follow in 6  months   Ashley Floyd Patricia Pesa, M.D.  [facility]  Medical Director [place] Director [facility] Cardio-Pulmonary Department

## 2018-03-12 ENCOUNTER — Ambulatory Visit: Payer: 59 | Admitting: Gastroenterology

## 2018-03-12 ENCOUNTER — Encounter: Payer: Self-pay | Admitting: Gastroenterology

## 2018-03-12 ENCOUNTER — Other Ambulatory Visit: Payer: Self-pay

## 2018-03-12 VITALS — BP 167/97 | HR 124 | Ht 64.0 in | Wt 213.0 lb

## 2018-03-12 DIAGNOSIS — K580 Irritable bowel syndrome with diarrhea: Secondary | ICD-10-CM

## 2018-03-12 NOTE — Progress Notes (Signed)
Primary Care Physician: Patient, No Pcp Per  Primary Gastroenterologist:  Dr. [name]  No chief complaint on file.   HPI: Ashley Floyd is a 47 y.o. female here for follow-up of her irritable bowel syndrome.  The patient has had chronic nausea and irritable bowel syndrome.  At her previous visit she had reported that despite having alternating diarrhea and constipation for some time she then had significantly more diarrhea.  The patient was also not doing well at that time on omeprazole and was switched to [place].  She was also recommended to undergo a colonoscopy since her significant change in bowel habits were reported.  The patient had decided against a colonoscopy at that time. She did report that the had a workup for shortness of breath at [facility] and it was determined that it was not due to her reflux.  The patient comes today with a list of diagnoses she thinks may be wrong with her.  The patient states that after consulting multiple friends in her mother she thinks she may have diverticulitis versus Crohn's disease versus ulcerative colitis versus bacterial infection causing her symptoms.  The patient does have intermittent normal bowel movements between her diarrhea and also reports that her diarrhea does not wake her up from sleep.  Current Outpatient Medications  Medication Sig Dispense Refill  . carbamazepine (TEGRETOL) 200 MG tablet Take 600 mg by mouth at bedtime.     . cholestyramine light (PREVALITE) 4 g packet Take 1 packet (4 g total) by mouth 4 (four) times daily. 60 packet 3  . clonazePAM (KLONOPIN) 1 MG tablet Take 2 mg by mouth at bedtime.     Marland Kitchen DEXILANT 60 MG capsule TAKE 1 CAPSULE BY MOUTH  DAILY 90 capsule 1  . dicyclomine (BENTYL) 20 MG tablet Take 1 tablet (20 mg total) by mouth 3 (three) times daily before meals. 90 tablet 3  . diphenhydrAMINE (BENADRYL) 25 MG tablet Take 25 mg by mouth at bedtime as needed. Reported on 05/23/2016    . hydrALAZINE (APRESOLINE)  25 MG tablet Take 1 tablet (25 mg total) by mouth 3 (three) times daily as needed. 90 tablet 6  . losartan-hydrochlorothiazide (HYZAAR) 100-12.5 MG tablet Take 1 tablet by mouth daily.    . naproxen sodium (ANAPROX) 550 MG tablet Take 1 tablet (550 mg total) by mouth 2 (two) times daily with a meal. Prn sx 60 tablet 1  . triamcinolone (NASACORT) 55 MCG/ACT AERO nasal inhaler Place 2 sprays into the nose daily. Reported on 05/23/2016    . valACYclovir (VALTREX) 500 MG tablet Take 1 tablet (500 mg total) by mouth 2 (two) times daily. For 3 days prn sx 30 tablet 1  . zaleplon (SONATA) 10 MG capsule Take 10 mg by mouth daily.      No current facility-administered medications for this visit.     Allergies as of 03/12/2018 - Review Complete 02/17/2018  Allergen Reaction Noted  . Cephalosporins Rash 03/22/2015  . Cefdinir Itching 08/18/2015  . Penicillins Other (See Comments) 03/22/2015  . Adhesive [tape] Rash 11/30/2015    ROS:  General: Negative for anorexia, weight loss, fever, chills, fatigue, weakness. ENT: Negative for hoarseness, difficulty swallowing , nasal congestion. CV: Negative for chest pain, angina, palpitations, dyspnea on exertion, peripheral edema.  Respiratory: Negative for dyspnea at rest, dyspnea on exertion, cough, sputum, wheezing.  GI: See history of present illness. GU:  Negative for dysuria, hematuria, urinary incontinence, urinary frequency, nocturnal urination.  Endo: Negative for  unusual weight change.    Physical Examination:   There were no vitals taken for this visit.  General: Well-nourished, well-developed in no acute distress.  Eyes: No icterus. Conjunctivae pink. Mouth: Oropharyngeal mucosa moist and pink , no lesions erythema or exudate. Lungs: Clear to auscultation bilaterally. Non-labored. Heart: Regular rate and rhythm, no murmurs rubs or gallops.  Abdomen: Bowel sounds are normal, nontender, nondistended, no hepatosplenomegaly or masses, no  abdominal bruits or hernia , no rebound or guarding.   Extremities: No lower extremity edema. No clubbing or deformities. Neuro: Alert and oriented x 3.  Grossly intact. Skin: Warm and dry, no jaundice.   Psych: Alert and cooperative, normal mood and affect.  Labs:    Imaging Studies: No results found.  Assessment and Plan:   [name] is a 47 y.o. y/o female With a long history of irritable bowel syndrome with diarrhea predominance.  The patient has been told that none of her symptoms appear to be consistent with her question of this being Crohn's disease or ulcerative colitis.  The patient also has been told that this does not meet the symptomatology of diverticulitis either.  She has also been told that if this was a bacterial infection it would have resolved by now.  The patient also reports that her symptoms are worse around her menstrual cycle.  These are all consistent with her having irritable bowel syndrome.  The patient does have a mother with a history of colon polyps and the patient will be set up for colonoscopy.  The patient now agrees to undergo the colonoscopy. I have discussed risks & benefits which include, but are not limited to, bleeding, infection, perforation & drug reaction.  The patient agrees with this plan & written consent will be obtained.     [name], MD. Marval Regal   Note: This dictation was prepared with Dragon dictation along with smaller phrase technology. Any transcriptional errors that result from this process are unintentional.

## 2018-03-16 ENCOUNTER — Other Ambulatory Visit: Payer: Self-pay

## 2018-03-16 DIAGNOSIS — K580 Irritable bowel syndrome with diarrhea: Secondary | ICD-10-CM

## 2018-03-25 ENCOUNTER — Ambulatory Visit: Payer: 59 | Admitting: Obstetrics and Gynecology

## 2018-04-07 ENCOUNTER — Ambulatory Visit (INDEPENDENT_AMBULATORY_CARE_PROVIDER_SITE_OTHER): Payer: 59 | Admitting: Obstetrics and Gynecology

## 2018-04-07 ENCOUNTER — Encounter: Payer: Self-pay | Admitting: Obstetrics and Gynecology

## 2018-04-07 VITALS — BP 126/90 | HR 84 | Ht 64.0 in | Wt 203.0 lb

## 2018-04-07 DIAGNOSIS — Z01419 Encounter for gynecological examination (general) (routine) without abnormal findings: Secondary | ICD-10-CM | POA: Diagnosis not present

## 2018-04-07 DIAGNOSIS — A6004 Herpesviral vulvovaginitis: Secondary | ICD-10-CM | POA: Diagnosis not present

## 2018-04-07 DIAGNOSIS — N951 Menopausal and female climacteric states: Secondary | ICD-10-CM | POA: Diagnosis not present

## 2018-04-07 DIAGNOSIS — L900 Lichen sclerosus et atrophicus: Secondary | ICD-10-CM

## 2018-04-07 DIAGNOSIS — Z1151 Encounter for screening for human papillomavirus (HPV): Secondary | ICD-10-CM

## 2018-04-07 DIAGNOSIS — Z1231 Encounter for screening mammogram for malignant neoplasm of breast: Secondary | ICD-10-CM | POA: Diagnosis not present

## 2018-04-07 DIAGNOSIS — N946 Dysmenorrhea, unspecified: Secondary | ICD-10-CM | POA: Diagnosis not present

## 2018-04-07 DIAGNOSIS — Z124 Encounter for screening for malignant neoplasm of cervix: Secondary | ICD-10-CM

## 2018-04-07 DIAGNOSIS — Z1239 Encounter for other screening for malignant neoplasm of breast: Secondary | ICD-10-CM

## 2018-04-07 MED ORDER — CLOBETASOL PROPIONATE 0.05 % EX OINT: TOPICAL_OINTMENT | CUTANEOUS | 0 refills | Status: DC

## 2018-04-07 MED ORDER — NAPROXEN SODIUM 550 MG PO TABS: 550.0000 mg | ORAL_TABLET | Freq: Two times a day (BID) | ORAL | 1 refills | Status: DC

## 2018-04-07 MED ORDER — VALACYCLOVIR HCL 500 MG PO TABS: 500.0000 mg | ORAL_TABLET | Freq: Two times a day (BID) | ORAL | 1 refills | Status: DC

## 2018-04-07 MED ORDER — TERCONAZOLE 0.8 % VA CREA: 1.0000 | TOPICAL_CREAM | Freq: Every day | VAGINAL | 0 refills | Status: AC

## 2018-04-07 NOTE — Progress Notes (Signed)
Chief Complaint  Patient presents with  . Gynecologic Exam    menopause - irratic periods; seems to be progressing     HPI:      [name] is a 47 y.o. G1P0010 who LMP was No LMP recorded. Patient is perimenopausal., presents today for her annual examination. Her menses are irregular now, Q3-3 1/2 wks,  lasting 6-7 days. Had 1 episode of bleeding Q2 wks. Dysmenorrhea moderate, occurring first 1-2 days of flow and midcycle with ovulation. She does not have intermenstrual bleeding. Menses last yr were Q1-4 months, but pt has lost about 40# this yr due to stress/GI issues. She has a hx of endometriosis and ovarian cysts. She takes anaprox for cramps with relief.   Pt with hx of lichen sclerosis sx and saw derm. Treats with terazol-3 and clobetasol oint prn sx. Pt needs Rx RF.  Sex activity: single partner, contraception - hx of infertility. Last Pap: March 25, 2015  Results were: no abnormalities /neg HPV DNA  Hx of STDs: HSV,oral and genital and takes valtrex a few times a year prn sx. Needs Rx RF.  Last mammogram: 04/08/17 at [facility].  Results were: normal--routine follow-up in 12 months There is no FH of breast cancer. There is no FH of ovarian cancer. The patient does not do self-breast exams.  Tobacco use: The patient denies current or previous tobacco use. Alcohol use: none Exercise: not active  She does not get adequate calcium and Vitamin D in her diet.  She sees cardio, Dr. Rockey Situ, for HTN.  She is seeing GI for IBS-D and wt loss. Has colonoscopy sched for 8/19.   Past Medical History:  Diagnosis Date  . Anxiety    a. per epic care everywhere & patient  . Bipolar affective disorder ([place])   . Complication of anesthesia    PT STATES SHE HAS A SMALL MOUTH AND HAS HAD DENTAL WORK ON HER 2 FRONT TEETH AND SHE WANTS EXTRA CARE TAKEN FOR HER TEETH  . Depression   . Dysmenorrhea   . Endometriosis   . Family history of adverse reaction to anesthesia    PTS MOM HAS  TROUBLE WITH ANESTHESIA DUE TO HER SMOKING HISTORY  . GERD (gastroesophageal reflux disease)   . Hemorrhoid   . Herpes genitalis   . History of mammogram 03/22/2015   BI-RADS 1 AT [facility]  . History of nuclear stress test    a. 08/2015: no EKG changes concerning for ischemia, normal wall motion, EF >60%, low risk study  . History of Papanicolaou smear of cervix 03/08/2015   -/-  . Hypertension   . IBS (irritable bowel syndrome)   . Infertility, female    [facility] PT IN THE PAST  . Obesity   . Ovarian cyst 1998   right  . PMDD (premenstrual dysphoric disorder)   . PONV (postoperative nausea and vomiting)   . Shortness of breath dyspnea   . Sinus infection   . Sleep apnea    "MILD"-NO CPAP-LAST SLEEP STUDY YEARS AGO    Past Surgical History:  Procedure Laterality Date  . CHOLECYSTECTOMY N/A 12/07/2015   Procedure: LAPAROSCOPIC CHOLECYSTECTOMY ;  Surgeon: Robert Bellow, MD;  Location: [facility] ORS;  Service: General;  Laterality: N/A;  . DIAGNOSTIC LAPAROSCOPY  1988; 2006   OSIS; OVARIAN CYST (2006 AT [facility])  . OVARIAN CYST REMOVAL    . UPPER GI ENDOSCOPY  2015   Dr Allen Norris  . URETHRAL DILATION  Family History  Problem Relation Age of Onset  . Arrhythmia Mother   . Cancer Mother        MELANOMA OF SKIN  . Broken bones Mother        neck  . Hypertension Maternal Grandmother   . Hypertension Maternal Grandfather   . Heart disease Paternal Grandmother   . Heart attack Paternal Grandmother   . Diabetes Paternal Grandmother   . Diabetes Father   . Cancer Paternal Grandfather        MAL NEO OF SKIN; LUNGS  . Heart failure Maternal Aunt   . Heart disease Maternal Aunt        CABG  . Diabetes Paternal Aunt   . Diabetes Other     Social History   Socioeconomic History  . Marital status: Legally Separated    Spouse name: Not on file  . Number of children: 0  . Years of education: 55  . Highest education level: Not on file  Occupational History  . Not on file  Social  Needs  . Financial resource strain: Not on file  . Food insecurity:    Worry: Not on file    Inability: Not on file  . Transportation needs:    Medical: Not on file    Non-medical: Not on file  Tobacco Use  . Smoking status: Never Smoker  . Smokeless tobacco: Never Used  Substance and Sexual Activity  . Alcohol use: Yes    Alcohol/week: 0.0 oz    Comment: rarely  . Drug use: No  . Sexual activity: Yes    Birth control/protection: Post-menopausal  Lifestyle  . Physical activity:    Days per week: Not on file    Minutes per session: Not on file  . Stress: Not on file  Relationships  . Social connections:    Talks on phone: Not on file    Gets together: Not on file    Attends religious service: Not on file    Active member of club or organization: Not on file    Attends meetings of clubs or organizations: Not on file    Relationship status: Not on file  . Intimate partner violence:    Fear of current or ex partner: Not on file    Emotionally abused: Not on file    Physically abused: Not on file    Forced sexual activity: Not on file  Other Topics Concern  . Not on file  Social History Narrative  . Not on file    Current Outpatient Medications on File Prior to Visit  Medication Sig Dispense Refill  . carbamazepine (TEGRETOL) 200 MG tablet Take 600 mg by mouth at bedtime.     . clonazePAM (KLONOPIN) 1 MG tablet Take 2 mg by mouth at bedtime.     Marland Kitchen DEXILANT 60 MG capsule TAKE 1 CAPSULE BY MOUTH  DAILY 90 capsule 1  . diphenhydrAMINE (BENADRYL) 25 MG tablet Take 25 mg by mouth at bedtime as needed. Reported on 05/23/2016    . hydrALAZINE (APRESOLINE) 25 MG tablet Take 1 tablet (25 mg total) by mouth 3 (three) times daily as needed. 90 tablet 6  . losartan-hydrochlorothiazide (HYZAAR) 100-12.5 MG tablet Take 1 tablet by mouth daily.    Marland Kitchen triamcinolone (NASACORT) 55 MCG/ACT AERO nasal inhaler Place 2 sprays into the nose daily. Reported on 05/23/2016    . zaleplon (SONATA) 10  MG capsule Take 10 mg by mouth daily.      No current facility-administered  medications on file prior to visit.       ROS:  Review of Systems  Constitutional: Positive for fatigue and unexpected weight change. Negative for fever.  Respiratory: Negative for cough, shortness of breath and wheezing.   Cardiovascular: Negative for chest pain, palpitations and leg swelling.  Gastrointestinal: Positive for constipation, diarrhea and nausea. Negative for blood in stool and vomiting.  Endocrine: Negative for cold intolerance, heat intolerance and polyuria.  Genitourinary: Negative for dyspareunia, dysuria, flank pain, frequency, genital sores, hematuria, menstrual problem, pelvic pain, urgency, vaginal bleeding, vaginal discharge and vaginal pain.  Musculoskeletal: Positive for arthralgias. Negative for back pain, joint swelling and myalgias.  Skin: Negative for rash.  Neurological: Positive for headaches. Negative for dizziness, syncope, light-headedness and numbness.  Hematological: Negative for adenopathy.  Psychiatric/Behavioral: Positive for dysphoric mood. Negative for agitation, confusion, sleep disturbance and suicidal ideas. The patient is not nervous/anxious.      Objective: BP 126/90   Pulse 84   Ht 5\' 4"  (1.626 m)   Wt 203 lb (92.1 kg)   BMI 34.84 kg/m    Physical Exam  Constitutional: She is oriented to person, place, and time. She appears well-developed and well-nourished.  Genitourinary: Vagina normal and uterus normal. There is no rash or tenderness on the right labia. There is no rash or tenderness on the left labia. No erythema or tenderness in the vagina. No vaginal discharge found. Right adnexum does not display mass and does not display tenderness. Left adnexum does not display mass and does not display tenderness. Cervix does not exhibit motion tenderness or polyp. Uterus is not enlarged or tender.  Neck: Normal range of motion. No thyromegaly present.    Cardiovascular: Normal rate, regular rhythm and normal heart sounds.  No murmur heard. Pulmonary/Chest: Effort normal and breath sounds normal. Right breast exhibits no mass, no nipple discharge, no skin change and no tenderness. Left breast exhibits no mass, no nipple discharge, no skin change and no tenderness.  Abdominal: Soft. There is no tenderness. There is no guarding.  Musculoskeletal: Normal range of motion.  Neurological: She is alert and oriented to person, place, and time. No cranial nerve deficit.  Psychiatric: She has a normal mood and affect. Her behavior is normal.  Vitals reviewed.   Assessment/Plan: Encounter for annual routine gynecological examination  Cervical cancer screening - Plan: IGP, Aptima HPV  Screening for HPV (human papillomavirus) - Plan: IGP, Aptima HPV  Screening for breast cancer - Pt to sched mammo.  - Plan: MM DIGITAL SCREENING BILATERAL  Herpes simplex vulvovaginitis - Rx RF valtrex to [facility]. F/u prn. - Plan: valACYclovir (VALTREX) 500 MG tablet  Dysmenorrhea - Rx RF anaprox to [facility].  - Plan: naproxen sodium (ANAPROX) 762 MG tablet  Lichen sclerosus - Rx RF terazol and clobetasol. Pt uses Rx prn.  - Plan: terconazole (TERAZOL 3) 0.8 % vaginal cream, clobetasol ointment (TEMOVATE) 0.05 %  Perimenopause - Menses Q3 wks with 1 episode Q2 wks. F/u if DUB sx recur for GYN u/s/further eval. Pt has had 40# wt loss/increased fam and work stress.   Meds ordered this encounter  Medications  . valACYclovir (VALTREX) 500 MG tablet    Sig: Take 1 tablet (500 mg total) by mouth 2 (two) times daily. For 3 days prn sx    Dispense:  30 tablet    Refill:  1    Order Specific Question:   Supervising Provider    Answer:   Gae Dry [id number]  . naproxen  sodium (ANAPROX) 550 MG tablet    Sig: Take 1 tablet (550 mg total) by mouth 2 (two) times daily with a meal. Prn sx    Dispense:  60 tablet    Refill:  1    Order Specific Question:   Supervising  Provider    Answer:   Gae Dry [id number]  . terconazole (TERAZOL 3) 0.8 % vaginal cream    Sig: Place 1 applicator vaginally at bedtime for 3 days.    Dispense:  20 g    Refill:  0    Order Specific Question:   Supervising Provider    Answer:   Gae Dry [id number]  . clobetasol ointment (TEMOVATE) 0.05 %    Sig: Apply to affected area BID prn sx    Dispense:  30 g    Refill:  0    Order Specific Question:   Supervising Provider    Answer:   Gae Dry [[id number]]             GYN counsel breast self exam, mammography screening, menopause, adequate intake of calcium and vitamin D, diet and exercise     F/U  Return in about 1 year (around 04/08/2019).  Alicia B. Copland, PA-C 04/07/2018 12:01 PM

## 2018-04-07 NOTE — Patient Instructions (Signed)
I value your feedback and entrusting us with your care. If you get a [place] patient survey, I would appreciate you taking the time to let us know about your experience today. Thank you! 

## 2018-04-10 LAB — IGP, APTIMA HPV
HPV Aptima: NEGATIVE
PAP Smear Comment: 0

## 2018-04-15 DIAGNOSIS — Z1231 Encounter for screening mammogram for malignant neoplasm of breast: Secondary | ICD-10-CM | POA: Diagnosis not present

## 2018-04-27 ENCOUNTER — Encounter: Payer: Self-pay | Admitting: Obstetrics and Gynecology

## 2018-06-22 ENCOUNTER — Other Ambulatory Visit: Payer: Self-pay | Admitting: Gastroenterology

## 2018-07-06 ENCOUNTER — Telehealth: Payer: Self-pay | Admitting: Obstetrics and Gynecology

## 2018-07-06 NOTE — Telephone Encounter (Signed)
Pt with less frequent periods, consistent with perimenopause. Reassurance. F/u if DUB sx occur for further eval.

## 2018-07-06 NOTE — Telephone Encounter (Signed)
Pt calling, hasn't had a cycle in 3 months. U/S not needed since she isn't bleeding like she was. Please follow up with pt like we spoke about. Thank you!

## 2018-07-24 ENCOUNTER — Encounter: Admission: RE | Payer: Self-pay | Source: Ambulatory Visit

## 2018-07-24 ENCOUNTER — Ambulatory Visit: Admission: RE | Admit: 2018-07-24 | Payer: 59 | Source: Ambulatory Visit | Admitting: Gastroenterology

## 2018-07-24 SURGERY — COLONOSCOPY WITH PROPOFOL
Anesthesia: Choice

## 2018-08-31 ENCOUNTER — Other Ambulatory Visit: Payer: Self-pay | Admitting: Obstetrics and Gynecology

## 2018-08-31 DIAGNOSIS — A6004 Herpesviral vulvovaginitis: Secondary | ICD-10-CM

## 2018-08-31 NOTE — Telephone Encounter (Signed)
Please advise 

## 2018-09-22 ENCOUNTER — Ambulatory Visit (INDEPENDENT_AMBULATORY_CARE_PROVIDER_SITE_OTHER): Payer: 59 | Admitting: Obstetrics and Gynecology

## 2018-09-22 ENCOUNTER — Encounter: Payer: Self-pay | Admitting: Obstetrics and Gynecology

## 2018-09-22 VITALS — BP 140/90 | HR 105 | Ht 64.0 in | Wt 225.0 lb

## 2018-09-22 DIAGNOSIS — N3001 Acute cystitis with hematuria: Secondary | ICD-10-CM | POA: Diagnosis not present

## 2018-09-22 LAB — POCT URINALYSIS DIPSTICK
Bilirubin, UA: NEGATIVE
Glucose, UA: NEGATIVE
Ketones, UA: NEGATIVE
Nitrite, UA: POSITIVE
Protein, UA: POSITIVE — AB
Spec Grav, UA: 1.02 (ref 1.010–1.025)
pH, UA: 6 (ref 5.0–8.0)

## 2018-09-22 MED ORDER — NITROFURANTOIN MONOHYD MACRO 100 MG PO CAPS: 100.0000 mg | ORAL_CAPSULE | Freq: Two times a day (BID) | ORAL | 0 refills | Status: AC

## 2018-09-22 NOTE — Patient Instructions (Signed)
I value your feedback and entrusting us with your care. If you get a [place] patient survey, I would appreciate you taking the time to let us know about your experience today. Thank you! 

## 2018-09-22 NOTE — Progress Notes (Signed)
Patient, No Pcp Per   Chief Complaint  Patient presents with  . Urinary Tract Infection    burning sensation, "horrible smell" per pt, their is also frequency, all these started today    HPI:      Ms. RUNELL KOVICH is a 47 y.o. G1P0010 who LMP was Patient's last menstrual period was 09/18/2018 (exact date)., presents today for urinary frequency/urgency/hematuria/dysuria and urine odor since this morning. No LBP, belly pain, fevers. No vag sx but pt is having her period. Hx of UTIs in distant past but none recently.    Past Medical History:  Diagnosis Date  . Anxiety    a. per epic care everywhere & patient  . Bipolar affective disorder ([place])   . Complication of anesthesia    PT STATES SHE HAS A SMALL MOUTH AND HAS HAD DENTAL WORK ON HER 2 FRONT TEETH AND SHE WANTS EXTRA CARE TAKEN FOR HER TEETH  . Depression   . Dysmenorrhea   . Endometriosis   . Family history of adverse reaction to anesthesia    PTS MOM HAS TROUBLE WITH ANESTHESIA DUE TO HER SMOKING HISTORY  . GERD (gastroesophageal reflux disease)   . Hemorrhoid   . Herpes genitalis   . History of mammogram 03/22/2015   BI-RADS 1 AT [facility]  . History of nuclear stress test    a. 08/2015: no EKG changes concerning for ischemia, normal wall motion, EF >60%, low risk study  . History of Papanicolaou smear of cervix 03/08/2015   -/-  . Hypertension   . IBS (irritable bowel syndrome)   . Infertility, female    [facility] PT IN THE PAST  . Obesity   . Ovarian cyst 1998   right  . PMDD (premenstrual dysphoric disorder)   . PONV (postoperative nausea and vomiting)   . Shortness of breath dyspnea   . Sinus infection   . Sleep apnea    "MILD"-NO CPAP-LAST SLEEP STUDY YEARS AGO    Past Surgical History:  Procedure Laterality Date  . CHOLECYSTECTOMY N/A 12/07/2015   Procedure: LAPAROSCOPIC CHOLECYSTECTOMY ;  Surgeon: Robert Bellow, MD;  Location: [facility] ORS;  Service: General;  Laterality: N/A;  . DIAGNOSTIC LAPAROSCOPY   1988; 2006   OSIS; OVARIAN CYST (2006 AT [facility])  . OVARIAN CYST REMOVAL    . UPPER GI ENDOSCOPY  2015   Dr Allen Norris  . URETHRAL DILATION      Family History  Problem Relation Age of Onset  . Arrhythmia Mother   . Cancer Mother        MELANOMA OF SKIN  . Broken bones Mother        neck  . Hypertension Maternal Grandmother   . Hypertension Maternal Grandfather   . Heart disease Paternal Grandmother   . Heart attack Paternal Grandmother   . Diabetes Paternal Grandmother   . Diabetes Father   . Cancer Paternal Grandfather        MAL NEO OF SKIN; LUNGS  . Heart failure Maternal Aunt   . Heart disease Maternal Aunt        CABG  . Diabetes Paternal Aunt   . Diabetes Other     Social History   Socioeconomic History  . Marital status: Legally Separated    Spouse name: Not on file  . Number of children: 0  . Years of education: 76  . Highest education level: Not on file  Occupational History  . Not on file  Social Needs  .  Financial resource strain: Not on file  . Food insecurity:    Worry: Not on file    Inability: Not on file  . Transportation needs:    Medical: Not on file    Non-medical: Not on file  Tobacco Use  . Smoking status: Never Smoker  . Smokeless tobacco: Never Used  Substance and Sexual Activity  . Alcohol use: Yes    Alcohol/week: 0.0 standard drinks    Comment: rarely  . Drug use: No  . Sexual activity: Not Currently    Birth control/protection: Post-menopausal  Lifestyle  . Physical activity:    Days per week: Not on file    Minutes per session: Not on file  . Stress: Not on file  Relationships  . Social connections:    Talks on phone: Not on file    Gets together: Not on file    Attends religious service: Not on file    Active member of club or organization: Not on file    Attends meetings of clubs or organizations: Not on file    Relationship status: Not on file  . Intimate partner violence:    Fear of current or ex partner: Not on file     Emotionally abused: Not on file    Physically abused: Not on file    Forced sexual activity: Not on file  Other Topics Concern  . Not on file  Social History Narrative  . Not on file    Outpatient Medications Prior to Visit  Medication Sig Dispense Refill  . carbamazepine (TEGRETOL) 200 MG tablet Take 600 mg by mouth at bedtime.     . clobetasol ointment (TEMOVATE) 0.05 % Apply to affected area BID prn sx 30 g 0  . clonazePAM (KLONOPIN) 1 MG tablet Take 2 mg by mouth at bedtime.     Marland Kitchen DEXILANT 60 MG capsule TAKE 1 CAPSULE BY MOUTH  DAILY 90 capsule 2  . diphenhydrAMINE (BENADRYL) 25 MG tablet Take 25 mg by mouth at bedtime as needed. Reported on 05/23/2016    . hydrALAZINE (APRESOLINE) 25 MG tablet Take 1 tablet (25 mg total) by mouth 3 (three) times daily as needed. 90 tablet 6  . losartan-hydrochlorothiazide (HYZAAR) 50-12.5 MG tablet Take by mouth.    . naproxen sodium (ANAPROX) 550 MG tablet Take 1 tablet (550 mg total) by mouth 2 (two) times daily with a meal. Prn sx 60 tablet 1  . triamcinolone (NASACORT) 55 MCG/ACT AERO nasal inhaler Place 2 sprays into the nose daily. Reported on 05/23/2016    . valACYclovir (VALTREX) 500 MG tablet TAKE 1 TABLET BY MOUTH 2  TIMES DAILY FOR 3 DAYS AS  NEEDED FOR SYMPTOMS 30 tablet 1  . zaleplon (SONATA) 10 MG capsule Take 10 mg by mouth daily.     Marland Kitchen losartan-hydrochlorothiazide (HYZAAR) 100-12.5 MG tablet Take 1 tablet by mouth daily.     No facility-administered medications prior to visit.       ROS:  Review of Systems  Constitutional: Negative for fever.  Gastrointestinal: Negative for blood in stool, constipation, diarrhea, nausea and vomiting.  Genitourinary: Positive for dysuria, frequency, urgency and vaginal bleeding. Negative for dyspareunia, flank pain, hematuria, vaginal discharge and vaginal pain.  Musculoskeletal: Negative for back pain.  Skin: Negative for rash.   BREAST: No symptoms   OBJECTIVE:   Vitals:  BP 140/90    Pulse (!) 105   Ht 5\' 4"  (1.626 m)   Wt 225 lb (102.1 kg)   LMP  09/18/2018 (Exact Date)   BMI 38.62 kg/m   Physical Exam  Constitutional: She is oriented to person, place, and time. She appears well-developed.  Neck: Normal range of motion.  Pulmonary/Chest: Effort normal.  Abdominal: There is no CVA tenderness.  Neurological: She is alert and oriented to person, place, and time. No cranial nerve deficit.  Psychiatric: She has a normal mood and affect. Her behavior is normal. Judgment and thought content normal.  Vitals reviewed.   Results: Results for orders placed or performed in visit on 09/22/18 (from the past 24 hour(s))  POCT Urinalysis Dipstick     Status: Abnormal   Collection Time: 09/22/18  4:53 PM  Result Value Ref Range   Color, UA straw    Clarity, UA cloudy    Glucose, UA Negative Negative   Bilirubin, UA neg    Ketones, UA neg    Spec Grav, UA 1.020 1.010 - 1.025   Blood, UA mod    pH, UA 6.0 5.0 - 8.0   Protein, UA Positive (A) Negative   Urobilinogen, UA     Nitrite, UA pos    Leukocytes, UA Large (3+) (A) Negative   Appearance     Odor       Assessment/Plan: Acute cystitis with hematuria - Pos sx/UA. Rx macrobid. Check C&S. F/u prn.  - Plan: POCT Urinalysis Dipstick, Urine Culture, nitrofurantoin, macrocrystal-monohydrate, (MACROBID) 100 MG capsule   Meds ordered this encounter  Medications  . nitrofurantoin, macrocrystal-monohydrate, (MACROBID) 100 MG capsule    Sig: Take 1 capsule (100 mg total) by mouth 2 (two) times daily for 5 days.    Dispense:  10 capsule    Refill:  0    Order Specific Question:   Supervising Provider    Answer:   Gae Dry [[id number]]      Return if symptoms worsen or fail to improve.  Alicia B. Copland, PA-C 09/22/2018 4:54 PM

## 2018-09-23 DIAGNOSIS — N3001 Acute cystitis with hematuria: Secondary | ICD-10-CM | POA: Diagnosis not present

## 2018-09-25 LAB — URINE CULTURE

## 2019-02-28 ENCOUNTER — Other Ambulatory Visit: Payer: Self-pay | Admitting: Obstetrics and Gynecology

## 2019-02-28 ENCOUNTER — Other Ambulatory Visit: Payer: Self-pay | Admitting: Gastroenterology

## 2019-02-28 DIAGNOSIS — A6004 Herpesviral vulvovaginitis: Secondary | ICD-10-CM

## 2019-05-03 ENCOUNTER — Ambulatory Visit (INDEPENDENT_AMBULATORY_CARE_PROVIDER_SITE_OTHER): Payer: 59 | Admitting: Obstetrics and Gynecology

## 2019-05-03 ENCOUNTER — Other Ambulatory Visit: Payer: Self-pay

## 2019-05-03 ENCOUNTER — Encounter: Payer: Self-pay | Admitting: Obstetrics and Gynecology

## 2019-05-03 VITALS — BP 120/80 | Ht 64.0 in | Wt 239.4 lb

## 2019-05-03 DIAGNOSIS — Z01419 Encounter for gynecological examination (general) (routine) without abnormal findings: Secondary | ICD-10-CM

## 2019-05-03 DIAGNOSIS — N951 Menopausal and female climacteric states: Secondary | ICD-10-CM

## 2019-05-03 DIAGNOSIS — L900 Lichen sclerosus et atrophicus: Secondary | ICD-10-CM

## 2019-05-03 DIAGNOSIS — Z1211 Encounter for screening for malignant neoplasm of colon: Secondary | ICD-10-CM | POA: Diagnosis not present

## 2019-05-03 DIAGNOSIS — Z1239 Encounter for other screening for malignant neoplasm of breast: Secondary | ICD-10-CM

## 2019-05-03 DIAGNOSIS — N946 Dysmenorrhea, unspecified: Secondary | ICD-10-CM

## 2019-05-03 DIAGNOSIS — B354 Tinea corporis: Secondary | ICD-10-CM

## 2019-05-03 DIAGNOSIS — A6004 Herpesviral vulvovaginitis: Secondary | ICD-10-CM

## 2019-05-03 LAB — HEMOCCULT GUIAC POC 1CARD (OFFICE): Fecal Occult Blood, POC: NEGATIVE

## 2019-05-03 MED ORDER — TERCONAZOLE 0.4 % VA CREA: TOPICAL_CREAM | VAGINAL | 0 refills | Status: DC

## 2019-05-03 MED ORDER — CLOBETASOL PROPIONATE 0.05 % EX OINT: TOPICAL_OINTMENT | CUTANEOUS | 0 refills | Status: DC

## 2019-05-03 MED ORDER — VALACYCLOVIR HCL 500 MG PO TABS: ORAL_TABLET | ORAL | 0 refills | Status: DC

## 2019-05-03 MED ORDER — NAPROXEN SODIUM 550 MG PO TABS: 550.0000 mg | ORAL_TABLET | Freq: Two times a day (BID) | ORAL | 1 refills | Status: DC

## 2019-05-03 NOTE — Progress Notes (Signed)
Chief Complaint  Patient presents with  . Gynecologic Exam    hot flashes, headaches, irregular bleeding  . [employer] Employee     HPI:      [name] is a 48 y.o. G1P0010 who LMP was No LMP recorded. Patient is perimenopausal., presents today for her annual examination. Her menses are irregular now, Q3wks- 4 months,  lasting 6-7 days, no BTB.  Dysmenorrhea moderate, occurring first 1-2 days of flow and midcycle with ovulation. Takes anaprox with sx relief. Needs Rx RF.  She does not have intermenstrual bleeding. She has a hx of endometriosis and ovarian cysts. She is now having bad vasomotor sx and insomnia. Trying flexeril per neuro for sleep but causing vibrations in her leg.   Pt with hx of lichen sclerosis sx and saw derm. Treats with  clobetasol oint prn sx. Pt needs Rx RF. Also treated with terconazole crm for tinea under bilat breasts. Hasn't tried antifungal powder.   Sex activity: not sex active; hx of infertility. Having issues with vaginal dryness. Uses lever 2000 soap and dryer sheets.  Last Pap: 04/07/18  Results were: no abnormalities /neg HPV DNA  Hx of STDs: HSV,oral and genital and takes valtrex a few times a year prn sx. Needs Rx RF.  Last mamm[facility]: [facility]/15/18 at [facility].  Results were: normal--routine follow-up in 12 months There is no FH of breast cancer. There is no FH of ovarian cancer. The patient does not do self-breast exams.  Tobacco use: The patient denies current or previous tobacco use. Alcohol use: none Exercise: not active  She does not get adequate calcium and Vitamin D in her diet.  Labs with PCP She sees cardio, Dr. Rockey Situ, for HTN.  She was seeing GI for IBS-D and wt loss last yr. Had colonoscopy sched for 8/19 but had to cancel. Hasn't had it done since.  Having issues with increased headache intensity and frequency, not associated with menses.    Past Medical History:  Diagnosis Date  . Anxiety    a. per epic care everywhere &  patient  . Bipolar affective disorder ([place])   . Complication of anesthesia    PT STATES SHE HAS A SMALL MOUTH AND HAS HAD DENTAL WORK ON HER 2 FRONT TEETH AND SHE WANTS EXTRA CARE TAKEN FOR HER TEETH  . Depression   . Dysmenorrhea   . Endometriosis   . Family history of adverse reaction to anesthesia    PTS MOM HAS TROUBLE WITH ANESTHESIA DUE TO HER SMOKING HISTORY  . GERD (gastroesophageal reflux disease)   . Hemorrhoid   . Herpes genitalis   . History of mammogram 03/22/2015   BI-RADS 1 AT [facility]  . History of nuclear stress test    a. 08/2015: no EKG changes concerning for ischemia, normal wall motion, EF >60%, low risk study  . History of Papanicolaou smear of cervix 03/08/2015   -/-  . Hypertension   . IBS (irritable bowel syndrome)   . Infertility, female    [facility] PT IN THE PAST  . Obesity   . Ovarian cyst 1998   right  . PMDD (premenstrual dysphoric disorder)   . PONV (postoperative nausea and vomiting)   . Shortness of breath dyspnea   . Sinus infection   . Sleep apnea    "MILD"-NO CPAP-LAST SLEEP STUDY YEARS AGO    Past Surgical History:  Procedure Laterality Date  . CHOLECYSTECTOMY N/A 12/07/2015   Procedure: LAPAROSCOPIC CHOLECYSTECTOMY ;  Surgeon: Dellis Filbert  Amedeo Kinsman, MD;  Location: [facility] ORS;  Service: General;  Laterality: N/A;  . DIAGNOSTIC LAPAROSCOPY  1988; 2006   OSIS; OVARIAN CYST (2006 AT [facility])  . OVARIAN CYST REMOVAL    . UPPER GI ENDOSCOPY  2015   Dr Allen Norris  . URETHRAL DILATION      Family History  Problem Relation Age of Onset  . Arrhythmia Mother   . Cancer Mother        MELANOMA OF SKIN  . Broken bones Mother        neck  . Hypertension Maternal Grandmother   . Hypertension Maternal Grandfather   . Heart disease Paternal Grandmother   . Heart attack Paternal Grandmother   . Diabetes Paternal Grandmother   . Diabetes Father   . Cancer Paternal Grandfather        MAL NEO OF SKIN; LUNGS  . Heart failure Maternal Aunt   . Heart disease Maternal  Aunt        CABG  . Diabetes Paternal Aunt   . Diabetes Other     Social History   Socioeconomic History  . Marital status: Legally Separated    Spouse name: Not on file  . Number of children: 0  . Years of education: 3  . Highest education level: Not on file  Occupational History  . Not on file  Social Needs  . Financial resource strain: Not on file  . Food insecurity:    Worry: Not on file    Inability: Not on file  . Transportation needs:    Medical: Not on file    Non-medical: Not on file  Tobacco Use  . Smoking status: Never Smoker  . Smokeless tobacco: Never Used  Substance and Sexual Activity  . Alcohol use: Yes    Alcohol/week: 0.0 standard drinks    Comment: rarely  . Drug use: No  . Sexual activity: Not Currently    Birth control/protection: Post-menopausal  Lifestyle  . Physical activity:    Days per week: Not on file    Minutes per session: Not on file  . Stress: Not on file  Relationships  . Social connections:    Talks on phone: Not on file    Gets together: Not on file    Attends religious service: Not on file    Active member of club or organization: Not on file    Attends meetings of clubs or organizations: Not on file    Relationship status: Not on file  . Intimate partner violence:    Fear of current or ex partner: Not on file    Emotionally abused: Not on file    Physically abused: Not on file    Forced sexual activity: Not on file  Other Topics Concern  . Not on file  Social History Narrative  . Not on file    Current Outpatient Medications on File Prior to Visit  Medication Sig Dispense Refill  . ALREX 0.2 % SUSP USE 1 DROP(S) IN BOTH EYES 4 TIMES A DAY AS NEEDED [PRN]    . carbamazepine (TEGRETOL) 200 MG tablet Take 600 mg by mouth at bedtime.     . clonazePAM (KLONOPIN) 1 MG tablet Take 2 mg by mouth at bedtime.     . cyclobenzaprine (FLEXERIL) 10 MG tablet TAKE 1 2 TABLETS BY MOUTH AT BEDTIME    . DEXILANT 60 MG capsule TAKE 1  CAPSULE BY MOUTH  DAILY 90 capsule 1  . diphenhydrAMINE (BENADRYL) 25 MG tablet  Take 25 mg by mouth at bedtime as needed. Reported on 05/23/2016    . hydrALAZINE (APRESOLINE) 25 MG tablet Take 1 tablet (25 mg total) by mouth 3 (three) times daily as needed. 90 tablet 6  . losartan-hydrochlorothiazide (HYZAAR) 50-12.5 MG tablet Take by mouth.    . triamcinolone (NASACORT) 55 MCG/ACT AERO nasal inhaler Place 2 sprays into the nose daily. Reported on 05/23/2016    . zaleplon (SONATA) 10 MG capsule Take 10 mg by mouth daily.      No current facility-administered medications on file prior to visit.       ROS:  Review of Systems  Constitutional: Positive for fatigue. Negative for fever and unexpected weight change.  Respiratory: Negative for cough, shortness of breath and wheezing.   Cardiovascular: Negative for chest pain, palpitations and leg swelling.  Gastrointestinal: Positive for constipation and diarrhea. Negative for blood in stool, nausea and vomiting.  Endocrine: Negative for cold intolerance, heat intolerance and polyuria.  Genitourinary: Negative for dyspareunia, dysuria, flank pain, frequency, genital sores, hematuria, menstrual problem, pelvic pain, urgency, vaginal bleeding, vaginal discharge and vaginal pain.  Musculoskeletal: Positive for arthralgias. Negative for back pain, joint swelling and myalgias.  Skin: Positive for rash.  Neurological: Positive for headaches. Negative for dizziness, syncope, light-headedness and numbness.  Hematological: Negative for adenopathy.  Psychiatric/Behavioral: Positive for agitation and dysphoric mood. Negative for confusion, sleep disturbance and suicidal ideas. The patient is not nervous/anxious.      Objective: BP 120/80   Ht 5\' 4"  (1.626 m)   Wt 239 lb 6.4 oz (108.6 kg)   BMI 41.09 kg/m    Physical Exam Constitutional:      Appearance: She is well-developed.  Genitourinary:     Vulva, vagina, cervix, uterus, right adnexa and  left adnexa normal.     No vulval lesion or tenderness noted.     No vaginal discharge, erythema or tenderness.     No cervical polyp.     Uterus is not enlarged or tender.     No right or left adnexal mass present.     Right adnexa not tender.     Left adnexa not tender.  Rectum:     Guaiac result negative.     External hemorrhoid present.     No tenderness.  Neck:     Musculoskeletal: Normal range of motion.     Thyroid: No thyromegaly.  Cardiovascular:     Rate and Rhythm: Normal rate and regular rhythm.     Heart sounds: Normal heart sounds. No murmur.  Pulmonary:     Effort: Pulmonary effort is normal.     Breath sounds: Normal breath sounds.  Chest:     Breasts:        Right: No mass, nipple discharge, skin change or tenderness.        Left: No mass, nipple discharge, skin change or tenderness.  Abdominal:     Palpations: Abdomen is soft.     Tenderness: There is no abdominal tenderness. There is no guarding.  Musculoskeletal: Normal range of motion.  Neurological:     General: No focal deficit present.     Mental Status: She is alert and oriented to person, place, and time.     Cranial Nerves: No cranial nerve deficit.  Skin:    General: Skin is warm and dry.  Psychiatric:        Mood and Affect: Mood normal.        Behavior: Behavior normal.  Thought Content: Thought content normal.        Judgment: Judgment normal.  Vitals signs reviewed.   RESULTS:  Results for orders placed or performed in visit on 05/03/19 (from the past 24 hour(s))  POCT Occult Blood Stool     Status: Normal   Collection Time: 05/03/19  4:50 PM  Result Value Ref Range   Fecal Occult Blood, POC Negative Negative   Card #1 Date     Card #2 Fecal Occult Blod, POC     Card #2 Date     Card #3 Fecal Occult Blood, POC     Card #3 Date       Assessment/Plan: Encounter for annual routine gynecological examination  Screening for breast cancer - Pt to sched mammo - Plan: MM 3D  SCREEN BREAST BILATERAL  Screening for colon cancer - Neg FOBT. Pt to f/u with GI for scr colonoscopy due to age. - Plan: POCT Occult Blood Stool  Perimenopause - F/u prn DUB.  Herpes simplex vulvovaginitis - Rx RF valtrex to [facility] - Plan: valACYclovir (VALTREX) 500 MG tablet  Dysmenorrhea - Rx RF anaprox to [facility] - Plan: naproxen sodium (ANAPROX) 161 MG tablet  Lichen sclerosus - Rx RF clobetasol to [facility] - Plan: clobetasol ointment (TEMOVATE) 0.05 %  Tinea corporis - Under breasts due to sweat. Rx RF terconazole (given to pt by derm) to [facility]. Try antifungal powder as preventive. - Plan: terconazole (TERAZOL 7) 0.4 % vaginal cream  Perimenopausal vasomotor symptoms - Exercise/wt loss, cool showers. Try estroven. F/u prn.   Meds ordered this encounter  Medications  . clobetasol ointment (TEMOVATE) 0.05 %    Sig: Apply to affected area BID prn sx    Dispense:  30 g    Refill:  0    Order Specific Question:   Supervising Provider    Answer:   Gae Dry [id number]  . naproxen sodium (ANAPROX) 550 MG tablet    Sig: Take 1 tablet (550 mg total) by mouth 2 (two) times daily with a meal. Prn sx    Dispense:  60 tablet    Refill:  1    Order Specific Question:   Supervising Provider    Answer:   Gae Dry [id number]  . valACYclovir (VALTREX) 500 MG tablet    Sig: TAKE 1 TABLET BY MOUTH 2  TIMES DAILY FOR 3 DAYS AS  NEEDED FOR SYMPTOMS    Dispense:  30 tablet    Refill:  0    Order Specific Question:   Supervising Provider    Answer:   Gae Dry [id number]  . terconazole (TERAZOL 7) 0.4 % vaginal cream    Sig: AAA BID prn sx    Dispense:  45 g    Refill:  0    Order Specific Question:   Supervising Provider    Answer:   Gae Dry [[id number]]             GYN counsel breast self exam, mammography screening, menopause, adequate intake of calcium and vitamin D, diet and exercise     F/U  Return in about 1 year (around 05/02/2020).  Dayla Gasca B. Khyron Garno, PA-C 05/03/2019  4:56 PM

## 2019-05-11 ENCOUNTER — Encounter: Payer: Self-pay | Admitting: Obstetrics and Gynecology

## 2019-07-12 ENCOUNTER — Other Ambulatory Visit: Payer: Self-pay | Admitting: Obstetrics and Gynecology

## 2019-07-12 DIAGNOSIS — A6004 Herpesviral vulvovaginitis: Secondary | ICD-10-CM

## 2019-07-23 ENCOUNTER — Other Ambulatory Visit: Payer: Self-pay | Admitting: Gastroenterology

## 2019-08-13 ENCOUNTER — Ambulatory Visit (INDEPENDENT_AMBULATORY_CARE_PROVIDER_SITE_OTHER): Payer: 59

## 2019-08-13 ENCOUNTER — Other Ambulatory Visit: Payer: Self-pay

## 2019-08-13 ENCOUNTER — Telehealth: Payer: Self-pay

## 2019-08-13 DIAGNOSIS — R300 Dysuria: Secondary | ICD-10-CM

## 2019-08-13 DIAGNOSIS — R350 Frequency of micturition: Secondary | ICD-10-CM | POA: Diagnosis not present

## 2019-08-13 LAB — POCT URINALYSIS DIPSTICK
Appearance: ABNORMAL
Bilirubin, UA: NEGATIVE
Blood, UA: NEGATIVE
Glucose, UA: NEGATIVE
Ketones, UA: NEGATIVE
Leukocytes, UA: NEGATIVE
Nitrite, UA: NEGATIVE
Odor: NORMAL
Protein, UA: NEGATIVE
Spec Grav, UA: 1.025 (ref 1.010–1.025)
Urobilinogen, UA: 0.2 E.U./dL
pH, UA: 5 (ref 5.0–8.0)

## 2019-08-13 NOTE — Telephone Encounter (Signed)
Pt calling; has a very painful kidney inf.  Would like to be scheduled c [facility].  [phone]

## 2019-08-13 NOTE — Progress Notes (Signed)
Patient reports symptoms of dysuria and urinary frequency. U/A done (Neg). Discussed w/A Copland, PAC. Culture sent per [facility] instructions

## 2019-08-13 NOTE — Telephone Encounter (Signed)
Patient is schedule 08/16/19 with [facility]

## 2019-08-13 NOTE — Telephone Encounter (Signed)
If pt has bad sx, can't wait till 9/21. Either work in with someone else or she needs to see PCP or urgent care.

## 2019-08-13 NOTE — Telephone Encounter (Signed)
Please contact pt to schedule appt.

## 2019-08-13 NOTE — Telephone Encounter (Signed)
Patient is schedule 08/13/19 for nurse visit

## 2019-08-13 NOTE — Telephone Encounter (Signed)
Called and left voicemail for patient to call back to be schedule for Nurse visit per [facility]. Schedule appointment after 1:30. Monday appointment cancelled

## 2019-08-15 LAB — URINE CULTURE

## 2019-08-15 NOTE — Progress Notes (Signed)
Pls let pt know C&S neg. Thx

## 2019-08-16 ENCOUNTER — Ambulatory Visit: Payer: 59 | Admitting: Obstetrics and Gynecology

## 2019-08-16 NOTE — Progress Notes (Signed)
Called pt, no answer, [facility]. 

## 2019-08-16 NOTE — Progress Notes (Signed)
Does she have any vag sx? If so, then pt needs appt. Make sure she is drinking lots of water (not sodas, tea, coffee).

## 2019-08-16 NOTE — Progress Notes (Signed)
Pt has noticed vag discharge with slight odor. Will schedule appt. Transferred to [facility].

## 2019-08-17 ENCOUNTER — Encounter: Payer: Self-pay | Admitting: Obstetrics and Gynecology

## 2019-08-17 ENCOUNTER — Other Ambulatory Visit: Payer: Self-pay

## 2019-08-17 ENCOUNTER — Ambulatory Visit (INDEPENDENT_AMBULATORY_CARE_PROVIDER_SITE_OTHER): Payer: 59 | Admitting: Obstetrics and Gynecology

## 2019-08-17 VITALS — BP 140/100 | Ht 64.0 in | Wt 244.0 lb

## 2019-08-17 DIAGNOSIS — N898 Other specified noninflammatory disorders of vagina: Secondary | ICD-10-CM | POA: Diagnosis not present

## 2019-08-17 DIAGNOSIS — R350 Frequency of micturition: Secondary | ICD-10-CM | POA: Diagnosis not present

## 2019-08-17 DIAGNOSIS — S39012A Strain of muscle, fascia and tendon of lower back, initial encounter: Secondary | ICD-10-CM

## 2019-08-17 NOTE — Patient Instructions (Signed)
I value your feedback and entrusting us with your care. If you get a [facility] patient survey, I would appreciate you taking the time to let us know about your experience today. Thank you! 

## 2019-08-17 NOTE — Progress Notes (Signed)
Patient, No Pcp Per   Chief Complaint  Patient presents with  . Urinary Tract Infection    back pain, frequency, blood in urine  . Vaginal Discharge    sour odor, no itchiness or irritation    HPI:      Ms. JEWELDINE THURNAU is a 48 y.o. G1P0010 who LMP was No LMP recorded. Patient is perimenopausal., presents today for increaesd vag d/c with sour odor, no irritation for the past wk. Hx of LS. Also with urinary frequency with good flow, mild dysuria and LT LBP. No back injury, no hx of kidney stones. Drinks [employer] all day, very little water. Using pain patches on back with some improvement. Had neg UA/C&S 08/13/19. Pt also with scant amt red vaginal bleeding occas since last wk, seen with wiping and on pad. Small amt this morning, but no bleeding since. LMP 4/20, perimenopausal. Had vasomotor sx a few months ago that improved.  Pt is sex active, no pain/bleeding.  Also note heavy feeling in chest and arms 2 days last wk with nausea, feeling like she had low blood sugar. Sx resolved. Labs with PCP. No hx of DM.  Past Medical History:  Diagnosis Date  . Anxiety    a. per epic care everywhere & patient  . Bipolar affective disorder ([place])   . Complication of anesthesia    PT STATES SHE HAS A SMALL MOUTH AND HAS HAD DENTAL WORK ON HER 2 FRONT TEETH AND SHE WANTS EXTRA CARE TAKEN FOR HER TEETH  . Depression   . Dysmenorrhea   . Endometriosis   . Family history of adverse reaction to anesthesia    PTS MOM HAS TROUBLE WITH ANESTHESIA DUE TO HER SMOKING HISTORY  . GERD (gastroesophageal reflux disease)   . Hemorrhoid   . Herpes genitalis   . History of mammogram 03/22/2015   BI-RADS 1 AT [facility]  . History of nuclear stress test    a. 08/2015: no EKG changes concerning for ischemia, normal wall motion, EF >60%, low risk study  . History of Papanicolaou smear of cervix 03/08/2015   -/-  . Hypertension   . IBS (irritable bowel syndrome)   . Infertility, female    [facility] PT IN THE PAST   . Obesity   . Ovarian cyst 1998   right  . PMDD (premenstrual dysphoric disorder)   . PONV (postoperative nausea and vomiting)   . Shortness of breath dyspnea   . Sinus infection   . Sleep apnea    "MILD"-NO CPAP-LAST SLEEP STUDY YEARS AGO    Past Surgical History:  Procedure Laterality Date  . CHOLECYSTECTOMY N/A 12/07/2015   Procedure: LAPAROSCOPIC CHOLECYSTECTOMY ;  Surgeon: Robert Bellow, MD;  Location: [facility] ORS;  Service: General;  Laterality: N/A;  . DIAGNOSTIC LAPAROSCOPY  1988; 2006   OSIS; OVARIAN CYST (2006 AT [facility])  . OVARIAN CYST REMOVAL    . UPPER GI ENDOSCOPY  2015   Dr Allen Norris  . URETHRAL DILATION      Family History  Problem Relation Age of Onset  . Arrhythmia Mother   . Cancer Mother        MELANOMA OF SKIN  . Broken bones Mother        neck  . Hypertension Maternal Grandmother   . Hypertension Maternal Grandfather   . Heart disease Paternal Grandmother   . Heart attack Paternal Grandmother   . Diabetes Paternal Grandmother   . Diabetes Father   . Cancer Paternal  Grandfather        MAL NEO OF SKIN; LUNGS  . Heart failure Maternal Aunt   . Heart disease Maternal Aunt        CABG  . Diabetes Paternal Aunt   . Diabetes Other     Social History   Socioeconomic History  . Marital status: Legally Separated    Spouse name: Not on file  . Number of children: 0  . Years of education: 47  . Highest education level: Not on file  Occupational History  . Not on file  Social Needs  . Financial resource strain: Not on file  . Food insecurity    Worry: Not on file    Inability: Not on file  . Transportation needs    Medical: Not on file    Non-medical: Not on file  Tobacco Use  . Smoking status: Never Smoker  . Smokeless tobacco: Never Used  Substance and Sexual Activity  . Alcohol use: Yes    Alcohol/week: 0.0 standard drinks    Comment: rarely  . Drug use: No  . Sexual activity: Not Currently    Birth control/protection: Post-menopausal   Lifestyle  . Physical activity    Days per week: Not on file    Minutes per session: Not on file  . Stress: Not on file  Relationships  . Social Herbalist on phone: Not on file    Gets together: Not on file    Attends religious service: Not on file    Active member of club or organization: Not on file    Attends meetings of clubs or organizations: Not on file    Relationship status: Not on file  . Intimate partner violence    Fear of current or ex partner: Not on file    Emotionally abused: Not on file    Physically abused: Not on file    Forced sexual activity: Not on file  Other Topics Concern  . Not on file  Social History Narrative  . Not on file    Outpatient Medications Prior to Visit  Medication Sig Dispense Refill  . ALREX 0.2 % SUSP USE 1 DROP(S) IN BOTH EYES 4 TIMES A DAY AS NEEDED [PRN]    . carbamazepine (TEGRETOL) 200 MG tablet Take 600 mg by mouth at bedtime.     . clobetasol ointment (TEMOVATE) 0.05 % Apply to affected area BID prn sx 30 g 0  . clonazePAM (KLONOPIN) 1 MG tablet Take 2 mg by mouth at bedtime.     Marland Kitchen DEXILANT 60 MG capsule TAKE 1 CAPSULE BY MOUTH  DAILY 90 capsule 1  . diphenhydrAMINE (BENADRYL) 25 MG tablet Take 25 mg by mouth at bedtime as needed. Reported on 05/23/2016    . naproxen sodium (ANAPROX) 550 MG tablet Take 1 tablet (550 mg total) by mouth 2 (two) times daily with a meal. Prn sx 60 tablet 1  . terconazole (TERAZOL 7) 0.4 % vaginal cream AAA BID prn sx 45 g 0  . triamcinolone (NASACORT) 55 MCG/ACT AERO nasal inhaler Place 2 sprays into the nose daily. Reported on 05/23/2016    . valACYclovir (VALTREX) 500 MG tablet TAKE 1 TABLET BY MOUTH 2  TIMES DAILY FOR 3 DAYS AS  NEEDED FOR SYMPTOMS 30 tablet 1  . zaleplon (SONATA) 10 MG capsule Take 10 mg by mouth daily.     . cyclobenzaprine (FLEXERIL) 10 MG tablet TAKE 1 2 TABLETS BY MOUTH AT BEDTIME    .  hydrALAZINE (APRESOLINE) 25 MG tablet Take 1 tablet (25 mg total) by mouth 3  (three) times daily as needed. 90 tablet 6  . losartan-hydrochlorothiazide (HYZAAR) 50-12.5 MG tablet Take by mouth.     No facility-administered medications prior to visit.       ROS:  Review of Systems  Constitutional: Negative for fatigue, fever and unexpected weight change.  Respiratory: Negative for cough, shortness of breath and wheezing.   Cardiovascular: Negative for chest pain, palpitations and leg swelling.  Gastrointestinal: Positive for nausea. Negative for blood in stool, constipation, diarrhea and vomiting.  Endocrine: Negative for cold intolerance, heat intolerance and polyuria.  Genitourinary: Positive for dysuria, frequency, vaginal bleeding and vaginal discharge. Negative for dyspareunia, flank pain, genital sores, hematuria, menstrual problem, pelvic pain, urgency and vaginal pain.  Musculoskeletal: Positive for back pain. Negative for joint swelling and myalgias.  Skin: Negative for rash.  Neurological: Negative for dizziness, syncope, light-headedness, numbness and headaches.  Hematological: Negative for adenopathy.  Psychiatric/Behavioral: Negative for agitation, confusion, sleep disturbance and suicidal ideas. The patient is not nervous/anxious.     OBJECTIVE:   Vitals:  BP (!) 140/100   Ht 5\' 4"  (1.626 m)   Wt 244 lb (110.7 kg)   BMI 41.88 kg/m   Physical Exam Vitals signs reviewed.  Constitutional:      Appearance: She is well-developed.  Neck:     Musculoskeletal: Normal range of motion.  Pulmonary:     Effort: Pulmonary effort is normal.  Abdominal:     Tenderness: There is no right CVA tenderness or left CVA tenderness.  Genitourinary:    General: Normal vulva.     Pubic Area: No rash.      Labia:        Right: No rash, tenderness or lesion.        Left: No rash, tenderness or lesion.      Vagina: Normal. No vaginal discharge, erythema, tenderness or bleeding.     Cervix: No cervical bleeding.     Uterus: Normal. Not enlarged and not  tender.      Adnexa: Right adnexa normal and left adnexa normal.       Right: No mass or tenderness.         Left: No mass or tenderness.       Comments: NO BLOOD ON VAG EXAM Musculoskeletal: Normal range of motion.     Lumbar back: She exhibits tenderness and spasm. She exhibits normal range of motion and no bony tenderness.       Back:  Skin:    General: Skin is warm and dry.  Neurological:     General: No focal deficit present.     Mental Status: She is alert and oriented to person, place, and time.  Psychiatric:        Mood and Affect: Mood normal.        Behavior: Behavior normal.        Thought Content: Thought content normal.        Judgment: Judgment normal.     Results: Results for orders placed or performed in visit on 08/17/19 (from the past 24 hour(s))  POCT Wet Prep with KOH     Status: Normal   Collection Time: 08/18/19  8:17 AM  Result Value Ref Range   Trichomonas, UA Negative    Clue Cells Wet Prep HPF POC neg    Epithelial Wet Prep HPF POC     Yeast Wet Prep HPF POC neg  Bacteria Wet Prep HPF POC     RBC Wet Prep HPF POC     WBC Wet Prep HPF POC     KOH Prep POC Negative Negative  POCT Urinalysis Dipstick     Status: Normal   Collection Time: 08/18/19  8:23 AM  Result Value Ref Range   Color, UA yellow    Clarity, UA clear    Glucose, UA Negative Negative   Bilirubin, UA neg    Ketones, UA neg    Spec Grav, UA 1.020 1.010 - 1.025   Blood, UA neg    pH, UA 6.0 5.0 - 8.0   Protein, UA Negative Negative   Urobilinogen, UA     Nitrite, UA neg    Leukocytes, UA Negative Negative   Appearance     Odor       Assessment/Plan: Strain of lumbar region, initial encounter--Tender on exam. Ice/heat/icy hot/massage/stretch. F/u prn.   Vaginal discharge - Plan: POCT Wet Prep with KOH; neg wet prep. Reassurance. F/u prn.   Urinary frequency--neg UA today and last wk. Neg C&S last wk. Most likely due to significant caffeine use. D/C Mtn Dew, increase  water. Check blood sugar labs with PCP.  Perimenopause--Question if blood is spotting. Pt to cont to watch and f/u prn DUB.    Return if symptoms worsen or fail to improve.  Shirlie Enck B. Lovett Coffin, PA-C 08/18/2019 8:24 AM

## 2019-08-18 LAB — POCT URINALYSIS DIPSTICK
Bilirubin, UA: NEGATIVE
Blood, UA: NEGATIVE
Glucose, UA: NEGATIVE
Ketones, UA: NEGATIVE
Leukocytes, UA: NEGATIVE
Nitrite, UA: NEGATIVE
Protein, UA: NEGATIVE
Spec Grav, UA: 1.02 (ref 1.010–1.025)
pH, UA: 6 (ref 5.0–8.0)

## 2019-08-18 LAB — POCT WET PREP WITH KOH
Clue Cells Wet Prep HPF POC: NEGATIVE
KOH Prep POC: NEGATIVE
Trichomonas, UA: NEGATIVE
Yeast Wet Prep HPF POC: NEGATIVE

## 2019-12-13 ENCOUNTER — Other Ambulatory Visit: Payer: Self-pay

## 2019-12-13 ENCOUNTER — Ambulatory Visit (INDEPENDENT_AMBULATORY_CARE_PROVIDER_SITE_OTHER): Payer: 59

## 2019-12-13 ENCOUNTER — Telehealth: Payer: Self-pay

## 2019-12-13 DIAGNOSIS — N390 Urinary tract infection, site not specified: Secondary | ICD-10-CM | POA: Diagnosis not present

## 2019-12-13 LAB — POCT URINALYSIS DIPSTICK
Bilirubin, UA: NEGATIVE
Blood, UA: NEGATIVE
Glucose, UA: NEGATIVE
Ketones, UA: NEGATIVE
Leukocytes, UA: NEGATIVE
Nitrite, UA: NEGATIVE
Protein, UA: NEGATIVE
Spec Grav, UA: >=1.03 — AB (ref 1.010–1.025)
Urobilinogen, UA: NEGATIVE E.U./dL — AB
pH, UA: 5 (ref 5.0–8.0)

## 2019-12-13 NOTE — Progress Notes (Signed)
Pt here to drop off a urine for u/a and culture.  Adv to go ahead and start Cipro to see if it will help c her sxs as she states she can't go two more days like this as culture needs to grow for 48hrs.

## 2019-12-13 NOTE — Telephone Encounter (Signed)
Pt calling; has UTI again; has taken a week of macrobid; finished last Monday; rx from her teledoc thru [place]; sxs stopped but returned yesterday - has constant burning, less output; teledoc called again - called in Cipro but adv pt to get urine dip and culture.  [phone] Pt has no fever, no d/c.  Tx'd to [facility] to sched c nurse today.

## 2019-12-13 NOTE — Addendum Note (Signed)
Addended by: Cleophas Dunker D on: 12/13/2019 03:02 PM   Modules accepted: Orders

## 2019-12-15 ENCOUNTER — Telehealth: Payer: Self-pay | Admitting: Obstetrics and Gynecology

## 2019-12-15 ENCOUNTER — Other Ambulatory Visit: Payer: Self-pay | Admitting: Obstetrics and Gynecology

## 2019-12-15 LAB — URINE CULTURE

## 2019-12-15 MED ORDER — FLUCONAZOLE 150 MG PO TABS: 150.0000 mg | ORAL_TABLET | Freq: Once | ORAL | 0 refills | Status: AC

## 2019-12-15 NOTE — Progress Notes (Signed)
It is not UTI.. She can try azo for UTI (turns urine orange) or needs appt, if doesn't want to try treating for yeast first. Thx.

## 2019-12-15 NOTE — Progress Notes (Signed)
Pls let pt know C&S neg. Can stop cipro. If still having sx, we can refer to urology. Thx

## 2019-12-15 NOTE — Progress Notes (Signed)
Pt to treat for yeast vag for burning, after 2 courses of abx. Rx diflucan eRxd. If still sx in 2-3 days then needs appt. Thx.

## 2019-12-15 NOTE — Progress Notes (Signed)
Called pt, no answer, could not leave voice msg due to mail box full. 

## 2019-12-15 NOTE — Telephone Encounter (Signed)
Patient is calling due to missed call. Patient was seen for Uti symptoms on 12/13/19. Please advise

## 2019-12-15 NOTE — Progress Notes (Signed)
Rx diflucan for dysuria with neg C&S. Been on 2 rounds abx. RTO if sx persist

## 2019-12-15 NOTE — Telephone Encounter (Signed)
Pt aware of results 

## 2019-12-17 ENCOUNTER — Ambulatory Visit: Payer: 59 | Admitting: Obstetrics and Gynecology

## 2019-12-17 NOTE — Progress Notes (Deleted)
Patient, No Pcp Per   No chief complaint on file.   HPI:      [name] is a 49 y.o. G1P0010 who LMP was No LMP recorded. Patient is perimenopausal., presents today for ***    Patient Active Problem List   Diagnosis Date Noted  . Strain of lumbar region 08/17/2019  . Perimenopause 05/03/2019  . Allergic rhinitis 05/15/2017  . Endometriosis 05/15/2017  . Acid reflux 05/15/2017  . Brash 05/15/2017  . Adaptive colitis 05/15/2017  . Cannot sleep 05/15/2017  . Lichen sclerosus [id number]  . Genital herpes 03/24/2017  . Preoperative cardiovascular examination 10/26/2015  . Gallstones 10/23/2015  . Opacity of lung on imaging study 10/16/2015  . Epigastric pain 10/04/2015  . Atypical chest pain 09/22/2015  . Anxiety   . IBS (irritable bowel syndrome) 07/26/2015  . Obesity, Class II, BMI 35-39.9 07/26/2015  . Pain in the chest 03/22/2015  . SOB (shortness of breath) on exertion 03/22/2015  . Bilateral leg edema 03/22/2015  . Morbid obesity ([place]) 03/22/2015  . Clinical depression 03/22/2015  . Affective bipolar disorder ([place]) 03/22/2015  . Essential hypertension 03/22/2015    Past Surgical History:  Procedure Laterality Date  . CHOLECYSTECTOMY N/A 12/07/2015   Procedure: LAPAROSCOPIC CHOLECYSTECTOMY ;  Surgeon: Robert Bellow, MD;  Location: [facility] ORS;  Service: General;  Laterality: N/A;  . DIAGNOSTIC LAPAROSCOPY  1988; 2006   OSIS; OVARIAN CYST (2006 AT [facility])  . OVARIAN CYST REMOVAL    . UPPER GI ENDOSCOPY  2015   Dr Allen Norris  . URETHRAL DILATION      Family History  Problem Relation Age of Onset  . Arrhythmia Mother   . Cancer Mother        MELANOMA OF SKIN  . Broken bones Mother        neck  . Hypertension Maternal Grandmother   . Hypertension Maternal Grandfather   . Heart disease Paternal Grandmother   . Heart attack Paternal Grandmother   . Diabetes Paternal Grandmother   . Diabetes Father   . Cancer Paternal Grandfather        MAL NEO OF  SKIN; LUNGS  . Heart failure Maternal Aunt   . Heart disease Maternal Aunt        CABG  . Diabetes Paternal Aunt   . Diabetes Other     Social History   Socioeconomic History  . Marital status: Legally Separated    Spouse name: Not on file  . Number of children: 0  . Years of education: 42  . Highest education level: Not on file  Occupational History  . Not on file  Tobacco Use  . Smoking status: Never Smoker  . Smokeless tobacco: Never Used  Substance and Sexual Activity  . Alcohol use: Yes    Alcohol/week: 0.0 standard drinks    Comment: rarely  . Drug use: No  . Sexual activity: Not Currently    Birth control/protection: Post-menopausal  Other Topics Concern  . Not on file  Social History Narrative  . Not on file   Social Determinants of Health   Financial Resource Strain:   . Difficulty of Paying Living Expenses: Not on file  Food Insecurity:   . Worried About Charity fundraiser in the Last Year: Not on file  . Ran Out of Food in the Last Year: Not on file  Transportation Needs:   . Lack of Transportation (Medical): Not on file  . Lack of Transportation (Non-Medical):  Not on file  Physical Activity:   . Days of Exercise per Week: Not on file  . Minutes of Exercise per Session: Not on file  Stress:   . Feeling of Stress : Not on file  Social Connections:   . Frequency of Communication with Friends and Family: Not on file  . Frequency of Social Gatherings with Friends and Family: Not on file  . Attends Religious Services: Not on file  . Active Member of Clubs or Organizations: Not on file  . Attends Archivist Meetings: Not on file  . Marital Status: Not on file  Intimate Partner Violence:   . Fear of Current or Ex-Partner: Not on file  . Emotionally Abused: Not on file  . Physically Abused: Not on file  . Sexually Abused: Not on file    Outpatient Medications Prior to Visit  Medication Sig Dispense Refill  . ALREX 0.2 % SUSP USE 1 DROP(S)  IN BOTH EYES 4 TIMES A DAY AS NEEDED [PRN]    . carbamazepine (TEGRETOL) 200 MG tablet Take 600 mg by mouth at bedtime.     . clobetasol ointment (TEMOVATE) 0.05 % Apply to affected area BID prn sx 30 g 0  . clonazePAM (KLONOPIN) 1 MG tablet Take 2 mg by mouth at bedtime.     Marland Kitchen DEXILANT 60 MG capsule TAKE 1 CAPSULE BY MOUTH  DAILY 90 capsule 1  . diphenhydrAMINE (BENADRYL) 25 MG tablet Take 25 mg by mouth at bedtime as needed. Reported on 05/23/2016    . naproxen sodium (ANAPROX) 550 MG tablet Take 1 tablet (550 mg total) by mouth 2 (two) times daily with a meal. Prn sx 60 tablet 1  . terconazole (TERAZOL 7) 0.4 % vaginal cream AAA BID prn sx 45 g 0  . triamcinolone (NASACORT) 55 MCG/ACT AERO nasal inhaler Place 2 sprays into the nose daily. Reported on 05/23/2016    . valACYclovir (VALTREX) 500 MG tablet TAKE 1 TABLET BY MOUTH 2  TIMES DAILY FOR 3 DAYS AS  NEEDED FOR SYMPTOMS 30 tablet 1  . zaleplon (SONATA) 10 MG capsule Take 10 mg by mouth daily.      No facility-administered medications prior to visit.      ROS:  Review of Systems BREAST: No symptoms   OBJECTIVE:   Vitals:  There were no vitals taken for this visit.  Physical Exam  Results: No results found for this or any previous visit (from the past 24 hour(s)).   Assessment/Plan: No diagnosis found.    No orders of the defined types were placed in this encounter.     No follow-ups on file.  Nautica Hotz B. Cayleb Jarnigan, PA-C 12/17/2019 9:36 AM

## 2019-12-26 ENCOUNTER — Other Ambulatory Visit: Payer: Self-pay | Admitting: Gastroenterology

## 2019-12-26 ENCOUNTER — Other Ambulatory Visit: Payer: Self-pay | Admitting: Obstetrics and Gynecology

## 2019-12-26 DIAGNOSIS — A6004 Herpesviral vulvovaginitis: Secondary | ICD-10-CM

## 2020-03-27 NOTE — Progress Notes (Deleted)
Cardiology Office Note  Date:  03/27/2020   ID:  Ashley Floyd, DOB [birth year], MRN [id number]  PCP:  Patient, No Pcp Per   No chief complaint on file.   HPI:  Ashley Floyd is a 49 year old woman with  obesity,  bipolar disorder, depression,   Previously evaluated for blood pressure and hypertension, shortness of breath She reports weight gain, dietary indiscretion. Significant family history of diabetes. Laparoscopic cholecystectomy, chronic diarrhea She presents today for follow-up of her shortness of breath, hypertension Considerable cardiac workup in the past including echocardiogram, stress test, chest CT scan  In follow-up today reports having several recent viral infections Some headaches, sinus pressure  Weight dropping 40 pounds over the past year Eating less, chronic nausea, diarrhea Managed by GI Is tried several medications, no significant improvement  At [facility] 29 years Lots of stress at work contributing to headaches and high blood pressure Does not have blood pressure cuff at home  No regular exercise, chronic shortness of breath, Previously did not tolerate torsemide, bottomed out her blood pressure She is tolerating low-dose HCTZ  Occasionally has palpitations, tachycardia Previously tried metoprolol, did not want to stay on the medication secondary to potential side effects  EKG personally reviewed by myself on todays visit Shows normal sinus rhythm rate 94 bpm no significant ST or T wave changes  Previously noted to have lung nodule on CT scan This resolved on repeat CT scan 3 months later  Cholecystectomy surgery January 2017 was successful   Other past medical history high blood pressure noted fall of 2015 , 141/100. Started on HCTZ 25 mg daily. Did not seem to help her blood pressure. changed to losartan HCTZ 50/12.5 mill grams daily. Blood pressure improved. She started having palpitations felt in her face, commonly at nighttime. Started on  metoprolol 25 mg.  This helped both headaches and palpitations. stopped out of concern for diabetes   weight gain, Drinks [facility] through the day, fast food, no exercise.  lab work October 2015 showing normal BMP, low vitamin D   PMH:   has a past medical history of Anxiety, Bipolar affective disorder ([place]), Complication of anesthesia, Depression, Dysmenorrhea, Endometriosis, Family history of adverse reaction to anesthesia, GERD (gastroesophageal reflux disease), Hemorrhoid, Herpes genitalis, History of mammogram (03/22/2015), History of nuclear stress test, History of Papanicolaou smear of cervix (03/08/2015), Hypertension, IBS (irritable bowel syndrome), Infertility, female, Obesity, Ovarian cyst (1998), PMDD (premenstrual dysphoric disorder), PONV (postoperative nausea and vomiting), Shortness of breath dyspnea, Sinus infection, and Sleep apnea.  PSH:    Past Surgical History:  Procedure Laterality Date  . CHOLECYSTECTOMY N/A 12/07/2015   Procedure: LAPAROSCOPIC CHOLECYSTECTOMY ;  Surgeon: Robert Bellow, MD;  Location: [facility] ORS;  Service: General;  Laterality: N/A;  . DIAGNOSTIC LAPAROSCOPY  1988; 2006   OSIS; OVARIAN CYST (2006 AT [facility])  . OVARIAN CYST REMOVAL    . UPPER GI ENDOSCOPY  2015   Dr Allen Norris  . URETHRAL DILATION      Current Outpatient Medications  Medication Sig Dispense Refill  . ALREX 0.2 % SUSP USE 1 DROP(S) IN BOTH EYES 4 TIMES A DAY AS NEEDED [PRN]    . carbamazepine (TEGRETOL) 200 MG tablet Take 600 mg by mouth at bedtime.     . clobetasol ointment (TEMOVATE) 0.05 % Apply to affected area BID prn sx 30 g 0  . clonazePAM (KLONOPIN) 1 MG tablet Take 2 mg by mouth at bedtime.     Marland Kitchen DEXILANT 60 MG capsule TAKE  1 CAPSULE BY MOUTH  DAILY 90 capsule 3  . diphenhydrAMINE (BENADRYL) 25 MG tablet Take 25 mg by mouth at bedtime as needed. Reported on 05/23/2016    . naproxen sodium (ANAPROX) 550 MG tablet Take 1 tablet (550 mg total) by mouth 2 (two) times daily with a  meal. Prn sx 60 tablet 1  . terconazole (TERAZOL 7) 0.4 % vaginal cream AAA BID prn sx 45 g 0  . triamcinolone (NASACORT) 55 MCG/ACT AERO nasal inhaler Place 2 sprays into the nose daily. Reported on 05/23/2016    . valACYclovir (VALTREX) 500 MG tablet TAKE 1 TABLET BY MOUTH 2  TIMES DAILY FOR 3 DAYS AS  NEEDED FOR SYMPTOMS 30 tablet 0  . zaleplon (SONATA) 10 MG capsule Take 10 mg by mouth daily.      No current facility-administered medications for this visit.     Allergies:   Cephalosporins, Penicillins, Adhesive [tape], and Cefdinir   Social History:  The patient  reports that she has never smoked. She has never used smokeless tobacco. She reports current alcohol use. She reports that she does not use drugs.   Family History:   family history includes Arrhythmia in her mother; Broken bones in her mother; Cancer in her mother and paternal grandfather; Diabetes in her father, paternal aunt, paternal grandmother, and another family member; Heart attack in her paternal grandmother; Heart disease in her maternal aunt and paternal grandmother; Heart failure in her maternal aunt; Hypertension in her maternal grandfather and maternal grandmother.    Review of Systems: Review of Systems  Constitutional: Negative.   Respiratory: Positive for shortness of breath.   Cardiovascular: Positive for palpitations.  Gastrointestinal: Negative.   Musculoskeletal: Negative.   Neurological: Positive for headaches.  Psychiatric/Behavioral: Negative.   All other systems reviewed and are negative.    PHYSICAL EXAM: VS:  There were no vitals taken for this visit. , BMI There is no height or weight on file to calculate BMI. Constitutional:  oriented to person, place, and time. No distress.  HENT:  Head: Normocephalic and atraumatic.  Eyes:  no discharge. No scleral icterus.  Neck: Normal range of motion. Neck supple. No JVD present.  Cardiovascular: Normal rate, regular rhythm, normal heart sounds and  intact distal pulses. Exam reveals no gallop and no friction rub. No edema No murmur heard. Pulmonary/Chest: Effort normal and breath sounds normal. No stridor. No respiratory distress.  no wheezes.  no rales.  no tenderness.  Abdominal: Soft.  no distension.  no tenderness.  Musculoskeletal: Normal range of motion.  no  tenderness or deformity.  Neurological:  normal muscle tone. Coordination normal. No atrophy Skin: Skin is warm and dry. No rash noted. not diaphoretic.  Psychiatric:  normal mood and affect. behavior is normal. Thought content normal.      Recent Labs: No results found for requested labs within last 8760 hours.    Lipid Panel No results found for: CHOL, HDL, LDLCALC, TRIG    Wt Readings from Last 3 Encounters:  08/17/19 244 lb (110.7 kg)  05/03/19 239 lb 6.4 oz (108.6 kg)  09/22/18 225 lb (102.1 kg)       ASSESSMENT AND PLAN:  Essential hypertension - She is concerned about high blood pressure Recommended she buy blood pressure cuff and monitor at home and call us with numbers Hypertension in the setting of headaches, suspect headaches contributing to high pressure if baseline numbers are normal Suggested she take hydralazine 25 up to 50 mg tab as  needed for hypertension greater than 160 if she is concerned  SOB (shortness of breath) -  Likely secondary to obesity and deconditioning No further ischemic workup needed Suspect symptoms will improve with 40 pound weight loss as she has done Stressed importance of regular walking program She has appointment with pulmonary today  Morbid obesity ([place]) - Weight down 40 pounds, reports having diarrhea GI problems  OSA (obstructive sleep apnea) - Plan: AMB referral to pulmonary rehabilitation Managed by Dr. Mortimer Fries  Physical deconditioning -  Recommended a structured exercise program for breathing, conditioning.   Preventive care Discussed preventive screening, lab work She does not have primary care Long  discussion concerning need to establish primary care physician, numbers provided We will order lab work for today including base metabolic panel, lipids, LFTs   Total encounter time more than 45 minutes  Greater than 50% was spent in counseling and coordination of care with the patient   Disposition:   F/U  12 months prn   No orders of the defined types were placed in this encounter.    Signed, Esmond Plants, M.D., Ph.D. 03/27/2020  [facility] [place], [place] [phone]

## 2020-03-28 ENCOUNTER — Ambulatory Visit: Payer: 59 | Admitting: Cardiovascular Disease

## 2020-03-28 DIAGNOSIS — I10 Essential (primary) hypertension: Secondary | ICD-10-CM

## 2020-03-28 DIAGNOSIS — R0602 Shortness of breath: Secondary | ICD-10-CM

## 2020-03-28 DIAGNOSIS — G4733 Obstructive sleep apnea (adult) (pediatric): Secondary | ICD-10-CM

## 2020-05-07 ENCOUNTER — Other Ambulatory Visit: Payer: Self-pay | Admitting: Obstetrics and Gynecology

## 2020-05-07 DIAGNOSIS — A6004 Herpesviral vulvovaginitis: Secondary | ICD-10-CM

## 2020-05-09 ENCOUNTER — Ambulatory Visit: Payer: 59 | Admitting: Obstetrics and Gynecology

## 2020-05-29 NOTE — Progress Notes (Signed)
Cardiology Office Note  Date:  05/30/2020   ID:  EMERI ESTILL, DOB 1971-03-11, MRN [id number]  PCP:  Patient, No Pcp Per   Cc: Leg swelling, hypertension, fluid retention   HPI:  Ms. Runkel is a 49 year old woman with  obesity,  bipolar disorder, depression,  hypertension, shortness of breath She reports weight gain, dietary indiscretion. Laparoscopic cholecystectomy, chronic diarrhea She presents today for follow-up of her shortness of breath, hypertension Considerable cardiac workup in the past including echocardiogram, stress test, chest CT scan  Work-up October 2016,  low risk stress test Normal echocardiogram  Previously reported tachycardia PAC, palpitations, Did not want to stay on metoprolol secondary to potential side effects  Chronic shortness of breath Did not tolerate torsemide, blood pressure dropped, Tolerating low-dose HCTZ  Weight dropping 40 pounds over the past year Eating less, chronic nausea, diarrhea Managed by GI Is tried several medications, no significant improvement  Weight up, 209 to 238 since 01/2018 Off losartan -hctz 100/12.5, out of the pill Rare palpitations  Drinks a lot of [place] Prior office visit reported eating fast food, no exercise. 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 Started on HCTZ 25 mg daily. Did not seem to help her blood pressure. changed to losartan HCTZ 50/12.5 mill grams daily. Blood pressure improved. She started having palpitations felt in her face, commonly at nighttime. Started on metoprolol 25 mg.  This helped both headaches and palpitations. stopped out of concern for  diabetes  PMH:   has a past medical history of Anxiety, Bipolar affective disorder ([place]), Complication of anesthesia, Depression, Dysmenorrhea, Endometriosis, Family history of adverse reaction to anesthesia, GERD (gastroesophageal reflux disease), Hemorrhoid, Herpes genitalis, History of mammogram (03/22/2015), History of nuclear stress test, History of Papanicolaou smear of cervix (03/08/2015), Hypertension, IBS (irritable bowel syndrome), Infertility, female, Obesity, Ovarian cyst (1998), PMDD (premenstrual dysphoric disorder), PONV (postoperative nausea and vomiting), Shortness of breath dyspnea, Sinus infection, and Sleep apnea.  PSH:    Past Surgical History:  Procedure Laterality Date  . CHOLECYSTECTOMY N/A 12/07/2015   Procedure: LAPAROSCOPIC CHOLECYSTECTOMY ;  Surgeon: Robert Bellow, MD;  Location: [facility] ORS;  Service: General;  Laterality: N/A;  . DIAGNOSTIC LAPAROSCOPY  1988; 2006   OSIS; OVARIAN CYST (2006 AT [facility])  . OVARIAN CYST REMOVAL    . UPPER GI ENDOSCOPY  2015   Dr Allen Norris  . URETHRAL DILATION      Current Outpatient Medications  Medication Sig Dispense Refill  . ALREX 0.2 % SUSP USE 1 DROP(S) IN BOTH EYES 4 TIMES A DAY AS NEEDED [PRN]    . carbamazepine (TEGRETOL) 200 MG tablet Take 800 mg by mouth at bedtime.    . clobetasol ointment (TEMOVATE) 0.05 % Apply to affected area BID prn sx 30 g 0  . clonazePAM (KLONOPIN) 1 MG tablet Take 3 mg by mouth at bedtime.     Marland Kitchen DEXILANT 60 MG capsule TAKE 1 CAPSULE BY MOUTH  DAILY 90 capsule 3  . diphenhydrAMINE (BENADRYL) 25 MG tablet Take 25 mg by mouth at bedtime as needed. Reported on 05/23/2016    . naproxen sodium (ANAPROX) 550 MG  tablet Take 1 tablet (550 mg total) by mouth 2 (two) times daily with a meal. Prn sx 60 tablet 1  . terconazole (TERAZOL 7) 0.4 % vaginal cream AAA BID prn sx 45 g 0  . triamcinolone (NASACORT) 55 MCG/ACT AERO nasal inhaler Place 2 sprays into the nose daily. Reported on 05/23/2016    . valACYclovir  (VALTREX) 500 MG tablet TAKE 1 TABLET BY MOUTH 2  TIMES DAILY FOR 3 DAYS AS  NEEDED FOR SYMPTOMS 30 tablet 0  . zaleplon (SONATA) 10 MG capsule Take 10 mg by mouth daily.     . carbamazepine (TEGRETOL) 200 MG tablet Take 600 mg by mouth at bedtime.     . hydrALAZINE (APRESOLINE) 25 MG tablet Take 1 tablet (25 mg total) by mouth 2 (two) times daily as needed (high pressure, headache). 180 tablet 3  . losartan-hydrochlorothiazide (HYZAAR) 50-12.5 MG tablet Take 1 tablet by mouth daily. 90 tablet 3  . metoprolol tartrate (LOPRESSOR) 25 MG tablet Take 1 tablet (25 mg total) by mouth 2 (two) times daily as needed (tachycardia). 180 tablet 3   No current facility-administered medications for this visit.     Allergies:   Cephalosporins, Penicillins, Adhesive [tape], and Cefdinir   Social History:  The patient  reports that she has never smoked. She has never used smokeless tobacco. She reports current alcohol use. She reports that she does not use drugs.   Family History:   family history includes Arrhythmia in her mother; Broken bones in her mother; Cancer in her mother and paternal grandfather; Diabetes in her father, paternal aunt, paternal grandmother, and another family member; Heart attack in her paternal grandmother; Heart disease in her maternal aunt and paternal grandmother; Heart failure in her maternal aunt; Hypertension in her maternal grandfather and maternal grandmother.   Review of Systems: Review of Systems  Constitutional: Negative.   Respiratory: Positive for shortness of breath.   Cardiovascular: Positive for palpitations.  Gastrointestinal: Negative.   Musculoskeletal: Negative.   Neurological: Positive for headaches.  Psychiatric/Behavioral: Negative.   All other systems reviewed and are negative.   PHYSICAL EXAM: VS:  BP 137/89 (BP Location: Left Arm, Patient Position: Sitting, Cuff Size: Large)   Pulse 94   Ht 5\' 4"  (1.626 m)   Wt 238 lb (108 kg)   SpO2 97%   BMI  40.85 kg/m  , BMI Body mass index is 40.85 kg/m. Constitutional:  oriented to person, place, and time. No distress.  Obese HENT:  Head: Grossly normal Eyes:  no discharge. No scleral icterus.  Neck: No JVD, no carotid bruits  Cardiovascular: Regular rate and rhythm, no murmurs appreciated Minimally pitting lower extremity swelling Pulmonary/Chest: Clear to auscultation bilaterally, no wheezes or rails Abdominal: Soft.  no distension.  no tenderness.  Musculoskeletal: Normal range of motion Neurological:  normal muscle tone. Coordination normal. No atrophy Skin: Skin warm and dry Psychiatric: normal affect, pleasant   Recent Labs: No results found for requested labs within last 8760 hours.    Lipid Panel No results found for: CHOL, HDL, LDLCALC, TRIG    Wt Readings from Last 3 Encounters:  05/30/20 238 lb (108 kg)  08/17/19 244 lb (110.7 kg)  05/03/19 239 lb 6.4 oz (108.6 kg)       ASSESSMENT AND PLAN:  Essential hypertension - We have refilled her losartan HCTZ 50/12.5 mg daily -Hydralazine as needed 25 mg  SOB (shortness of breath) -  Likely secondary to obesity and deconditioning Weight up 20 pounds  over the past several years Recommend walking program, strict diet, avoid soda Currently drinking lots of [facility], just cussed other beverages to try  Morbid obesity ([place]) - With hypertension, sleep apnea, shortness of breath, diastolic dysfunction, elevated glucose  OSA (obstructive sleep apnea) - Recommended walking program  Adjustment disorder Take care of mother who has medical issues, work stressful  Insomnia Discussed strategies, tried numerous sleep aids, does not seem to be working  Long discussion concerning weight, insomnia, work stress, home stress Disposition:   F/U  One year BMP in 1 months   Total encounter time more than 45 minutes  Greater than 50% was spent in counseling and coordination of care with the patient    Orders Placed  This Encounter  Procedures  . EKG 12-Lead     Signed, Esmond Plants, M.D., Ph.D. 05/30/2020  [place], [place]

## 2020-05-30 ENCOUNTER — Other Ambulatory Visit: Payer: Self-pay

## 2020-05-30 ENCOUNTER — Ambulatory Visit: Payer: 59 | Admitting: Cardiovascular Disease

## 2020-05-30 VITALS — BP 137/89 | HR 94 | Ht 64.0 in | Wt 238.0 lb

## 2020-05-30 DIAGNOSIS — I10 Essential (primary) hypertension: Secondary | ICD-10-CM | POA: Diagnosis not present

## 2020-05-30 DIAGNOSIS — R0602 Shortness of breath: Secondary | ICD-10-CM

## 2020-05-30 DIAGNOSIS — R0789 Other chest pain: Secondary | ICD-10-CM | POA: Diagnosis not present

## 2020-05-30 DIAGNOSIS — F319 Bipolar disorder, unspecified: Secondary | ICD-10-CM

## 2020-05-30 MED ORDER — HYDRALAZINE HCL 25 MG PO TABS: 25.0000 mg | ORAL_TABLET | Freq: Two times a day (BID) | ORAL | 3 refills | Status: DC | PRN

## 2020-05-30 MED ORDER — METOPROLOL TARTRATE 25 MG PO TABS: 25.0000 mg | ORAL_TABLET | Freq: Two times a day (BID) | ORAL | 3 refills | Status: DC | PRN

## 2020-05-30 MED ORDER — LOSARTAN POTASSIUM-HCTZ 50-12.5 MG PO TABS: 1.0000 | ORAL_TABLET | Freq: Every day | ORAL | 3 refills | Status: DC

## 2020-05-30 NOTE — Patient Instructions (Addendum)
Medication Instructions:  Metoprolol tartrate 25 twice a day PRN for palpitations, tachycardia  Restart losartan HCTZ 50/12.5 daily  Hydralazine as needed for blood perssure  If you need a refill on your cardiac medications before your next appointment, please call your pharmacy.    Lab work: No new labs needed   If you have labs (blood work) drawn today and your tests are completely normal, you will receive your results only by: Marland Kitchen MyChart Message (if you have MyChart) OR . A paper copy in the mail If you have any lab test that is abnormal or we need to change your treatment, we will call you to review the results.   Testing/Procedures: No new testing needed  Follow-Up: At [facility], you and your health needs are our priority.  As part of our continuing mission to provide you with exceptional heart care, we have created designated Provider Care Teams.  These Care Teams include your primary Cardiologist (physician) and Advanced Practice Providers (APPs -  Physician Assistants and Nurse Practitioners) who all work together to provide you with the care you need, when you need it.  . You will need a follow up appointment 1 year . BMP 1 months  . Providers on your designated Care Team:   . Murray Hodgkins, NP . Christell Faith, PA-C . Marrianne Mood, PA-C  Any Other Special Instructions Will Be Listed Below (If Applicable).  COVID-19 Vaccine Information can be found at: ShippingScam.co.uk For questions related to vaccine distribution or appointments, please email vaccine@[email or web address] or call 5091825236.

## 2020-06-06 ENCOUNTER — Telehealth: Payer: Self-pay | Admitting: *Deleted

## 2020-06-06 DIAGNOSIS — R0602 Shortness of breath: Secondary | ICD-10-CM

## 2020-06-06 DIAGNOSIS — R0789 Other chest pain: Secondary | ICD-10-CM

## 2020-06-06 NOTE — Telephone Encounter (Signed)
-----   Message from Minna Merritts, MD sent at 05/30/2020  6:17 PM EDT ----- Can we arrange for BMP in 1 month after she has been on the HCTZ to check her kidneys Thx TG

## 2020-06-06 NOTE — Telephone Encounter (Signed)
Spoke with patient and reviewed provider would like her to have labs done one month after starting her HCTZ. She just received it today and recommended that she go around August 16th to the [facility] entrance to have BMP done. She verbalized understanding, was agreeable with plan, and had no further questions at this time.

## 2020-06-07 NOTE — Progress Notes (Signed)
Chief Complaint  Patient presents with  . Gynecologic Exam    small tear near clitoris  . [employer] employee     HPI:      [name] is a 49 y.o. G1P0010 who LMP was Patient's last menstrual period was 11/15/2019 (exact date)., presents today for her annual examination. Her menses are infrequent now, lasting 6-7 days, no BTB.  Dysmenorrhea moderate, occurring first 1-2 days of flow and midcycle with ovulation. Takes anaprox with sx relief. Doesn't need Rx RF.  She does not have intermenstrual bleeding. She has a hx of endometriosis and ovarian cysts. She is now having bad vasomotor sx and insomnia. Taking sonata for sleep without relief. Has tried several sleep meds in past without relief. Not exercising.   Pt with hx of lichen sclerosis sx and saw derm. Treats with clobetasol oint prn sx, usually for a few days at a time, a few times a yr. Pt needs Rx RF. Also treats with terconazole crm for tinea under bilat breasts and sometimes vaginally if clobetasol doesn't work. Has small tear near clitoris recently after wiping too hard.  Sex activity: not sex active; hx of infertility. Last Pap: 04/07/18  Results were: no abnormalities /neg HPV DNA  Hx of STDs: HSV,oral and genital and takes valtrex a few times a year prn sx. Needs Rx RF to [facility].  Last mammogram: 05/07/19 at [facility].  Results were: normal--routine follow-up in 12 months There is no FH of breast cancer. There is no FH of ovarian cancer. The patient does not do self-breast exams.  Tobacco use: The patient denies current or previous tobacco use. Alcohol use: none No drug use Exercise: not active  She does not get adequate calcium and Vitamin D in her diet.  Labs with PCP  She sees cardio, Ashley. Rockey Floyd, for HTN.  She was seeing GI for IBS-D and wt loss last yr. Had colonoscopy sched for 8/19 but had to cancel. Plans to reschedule   Past Medical History:  Diagnosis Date  . Anxiety    a. per epic care everywhere &  patient  . Bipolar affective disorder ([place])   . Complication of anesthesia    PT STATES SHE HAS A SMALL MOUTH AND HAS HAD DENTAL WORK ON HER 2 FRONT TEETH AND SHE WANTS EXTRA CARE TAKEN FOR HER TEETH  . Depression   . Dysmenorrhea   . Endometriosis   . Family history of adverse reaction to anesthesia    PTS MOM HAS TROUBLE WITH ANESTHESIA DUE TO HER SMOKING HISTORY  . GERD (gastroesophageal reflux disease)   . Hemorrhoid   . Herpes genitalis   . History of mammogram 03/22/2015   BI-RADS 1 AT [facility]  . History of nuclear stress test    a. 08/2015: no EKG changes concerning for ischemia, normal wall motion, EF >60%, low risk study  . History of Papanicolaou smear of cervix 03/08/2015   -/-  . Hypertension   . IBS (irritable bowel syndrome)   . Infertility, female    [facility] PT IN THE PAST  . Obesity   . Ovarian cyst 1998   right  . PMDD (premenstrual dysphoric disorder)   . PONV (postoperative nausea and vomiting)   . Shortness of breath dyspnea   . Sinus infection   . Sleep apnea    "MILD"-NO CPAP-LAST SLEEP STUDY YEARS AGO    Past Surgical History:  Procedure Laterality Date  . CHOLECYSTECTOMY N/A 12/07/2015   Procedure: LAPAROSCOPIC CHOLECYSTECTOMY ;  Surgeon: Ashley Bellow, MD;  Location: [facility] ORS;  Service: General;  Laterality: N/A;  . DIAGNOSTIC LAPAROSCOPY  1988; 2006   OSIS; OVARIAN CYST (2006 AT [facility])  . OVARIAN CYST REMOVAL    . UPPER GI ENDOSCOPY  2015   Ashley Floyd  . URETHRAL DILATION      Family History  Problem Relation Age of Onset  . Arrhythmia Mother   . Cancer Mother        MELANOMA OF SKIN  . Broken bones Mother        neck  . Hypertension Maternal Grandmother   . Hypertension Maternal Grandfather   . Heart disease Paternal Grandmother   . Heart attack Paternal Grandmother   . Diabetes Paternal Grandmother   . Diabetes Father   . Cancer Paternal Grandfather        MAL NEO OF SKIN; LUNGS  . Heart failure Maternal Aunt   . Heart disease Maternal  Aunt        CABG  . Diabetes Paternal Aunt   . Diabetes Other     Social History   Socioeconomic History  . Marital status: Legally Separated    Spouse name: Not on file  . Number of children: 0  . Years of education: 65  . Highest education level: Not on file  Occupational History  . Not on file  Tobacco Use  . Smoking status: Never Smoker  . Smokeless tobacco: Never Used  Vaping Use  . Vaping Use: Never used  Substance and Sexual Activity  . Alcohol use: Yes    Alcohol/week: 0.0 standard drinks    Comment: rarely  . Drug use: No  . Sexual activity: Not Currently    Birth control/protection: Post-menopausal  Other Topics Concern  . Not on file  Social History Narrative  . Not on file   Social Determinants of Health   Financial Resource Strain:   . Difficulty of Paying Living Expenses:   Food Insecurity:   . Worried About Charity fundraiser in the Last Year:   . Arboriculturist in the Last Year:   Transportation Needs:   . Film/video editor (Medical):   Ashley Floyd Kitchen Lack of Transportation (Non-Medical):   Physical Activity:   . Days of Exercise per Week:   . Minutes of Exercise per Session:   Stress:   . Feeling of Stress :   Social Connections:   . Frequency of Communication with Friends and Family:   . Frequency of Social Gatherings with Friends and Family:   . Attends Religious Services:   . Active Member of Clubs or Organizations:   . Attends Archivist Meetings:   Ashley Floyd Kitchen Marital Status:   Intimate Partner Violence:   . Fear of Current or Ex-Partner:   . Emotionally Abused:   Ashley Floyd Kitchen Physically Abused:   . Sexually Abused:     Current Outpatient Medications on File Prior to Visit  Medication Sig Dispense Refill  . ALREX 0.2 % SUSP USE 1 DROP(S) IN BOTH EYES 4 TIMES A DAY AS NEEDED [PRN]    . carbamazepine (TEGRETOL) 200 MG tablet Take 600 mg by mouth at bedtime.     . carbamazepine (TEGRETOL) 200 MG tablet Take 800 mg by mouth at bedtime.    .  clonazePAM (KLONOPIN) 1 MG tablet Take 3 mg by mouth at bedtime.     Ashley Floyd Kitchen DEXILANT 60 MG capsule TAKE 1 CAPSULE BY MOUTH  DAILY 90 capsule 3  . diphenhydrAMINE (  BENADRYL) 25 MG tablet Take 25 mg by mouth at bedtime as needed. Reported on 05/23/2016    . Doxepin HCl 6 MG TABS Take 1 tablet by mouth at bedtime.    . hydrALAZINE (APRESOLINE) 25 MG tablet Take 1 tablet (25 mg total) by mouth 2 (two) times daily as needed (high pressure, headache). 180 tablet 3  . losartan-hydrochlorothiazide (HYZAAR) 50-12.5 MG tablet Take 1 tablet by mouth daily. 90 tablet 3  . metoprolol tartrate (LOPRESSOR) 25 MG tablet Take 1 tablet (25 mg total) by mouth 2 (two) times daily as needed (tachycardia). 180 tablet 3  . naproxen sodium (ANAPROX) 550 MG tablet Take 1 tablet (550 mg total) by mouth 2 (two) times daily with a meal. Prn sx 60 tablet 1  . phenazopyridine (PYRIDIUM) 200 MG tablet Take 200 mg by mouth 3 (three) times daily as needed.    . triamcinolone (NASACORT) 55 MCG/ACT AERO nasal inhaler Place 2 sprays into the nose daily. Reported on 05/23/2016    . valACYclovir (VALTREX) 500 MG tablet TAKE 1 TABLET BY MOUTH 2  TIMES DAILY FOR 3 DAYS AS  NEEDED FOR SYMPTOMS 30 tablet 0  . zaleplon (SONATA) 10 MG capsule Take 10 mg by mouth daily.      No current facility-administered medications on file prior to visit.      ROS:  Review of Systems  Constitutional: Positive for fatigue. Negative for fever and unexpected weight change.  Respiratory: Negative for cough, shortness of breath and wheezing.   Cardiovascular: Negative for chest pain, palpitations and leg swelling.  Gastrointestinal: Positive for constipation and diarrhea. Negative for blood in stool, nausea and vomiting.  Endocrine: Negative for cold intolerance, heat intolerance and polyuria.  Genitourinary: Negative for dyspareunia, dysuria, flank pain, frequency, genital sores, hematuria, menstrual problem, pelvic pain, urgency, vaginal bleeding, vaginal  discharge and vaginal pain.  Musculoskeletal: Positive for arthralgias. Negative for back pain, joint swelling and myalgias.  Skin: Positive for rash.  Neurological: Positive for headaches. Negative for dizziness, syncope, light-headedness and numbness.  Hematological: Negative for adenopathy.  Psychiatric/Behavioral: Positive for agitation and dysphoric mood. Negative for confusion, sleep disturbance and suicidal ideas. The patient is not nervous/anxious.      Objective: BP 130/90   Ht 5\' 4"  (1.626 m)   Wt 236 lb (107 kg)   LMP 11/15/2019 (Exact Date)   BMI 40.51 kg/m    Physical Exam Constitutional:      Appearance: She is well-developed.  Genitourinary:     Vulva, vagina, cervix, uterus, right adnexa and left adnexa normal.     No vulval lesion or tenderness noted.        No vaginal discharge, erythema or tenderness.     No cervical polyp.     Uterus is not enlarged or tender.     No right or left adnexal mass present.     Right adnexa not tender.     Left adnexa not tender.  Rectum:     Guaiac result negative.     External hemorrhoid present.     No tenderness.  Neck:     Thyroid: No thyromegaly.  Cardiovascular:     Rate and Rhythm: Normal rate and regular rhythm.     Heart sounds: Normal heart sounds. No murmur heard.   Pulmonary:     Effort: Pulmonary effort is normal.     Breath sounds: Normal breath sounds.  Chest:     Breasts:        Right: No  mass, nipple discharge, skin change or tenderness.        Left: No mass, nipple discharge, skin change or tenderness.  Abdominal:     Palpations: Abdomen is soft.     Tenderness: There is no abdominal tenderness. There is no guarding.  Musculoskeletal:        General: Normal range of motion.     Cervical back: Normal range of motion.  Neurological:     General: No focal deficit present.     Mental Status: She is alert and oriented to person, place, and time.     Cranial Nerves: No cranial nerve deficit.   Skin:    General: Skin is warm and dry.  Psychiatric:        Mood and Affect: Mood normal.        Behavior: Behavior normal.        Thought Content: Thought content normal.        Judgment: Judgment normal.  Vitals reviewed.    Assessment/Plan: Encounter for annual routine gynecological examination  Encounter for screening mammogram for malignant neoplasm of breast - Plan: MM 3D SCREEN BREAST BILATERAL; pt has mammo sched  Screening for colon cancer--pt to call to sched with GI  Herpes simplex vulvovaginitis--pt to call for valtrex RF prn.  Lichen sclerosus - Plan: clobetasol ointment (TEMOVATE) 0.05 %; Rx RF. Treat perineal/perianal area  Perimenopausal vasomotor symptoms - Plan: progesterone (PROMETRIUM) 100 MG capsule; Try progesterone for sx. Rx eRxd. F/u in 3 mos prn sx. Add exercise.  Tinea corporis - Under breasts due to sweat. Rx RF terconazole (given to pt by derm) to [facility]. Try antifungal powder as preventive. - Plan: terconazole (TERAZOL 7) 0.4 % vaginal cream  Meds ordered this encounter  Medications  . progesterone (PROMETRIUM) 100 MG capsule    Sig: Take 1 cap nightly, 6 nights on, 1 night off    Dispense:  90 capsule    Refill:  0    Order Specific Question:   Supervising Provider    Answer:   Gae Dry [id number]  . terconazole (TERAZOL 7) 0.4 % vaginal cream    Sig: AAA BID prn sx    Dispense:  45 g    Refill:  0    Order Specific Question:   Supervising Provider    Answer:   Gae Dry [id number]  . clobetasol ointment (TEMOVATE) 0.05 %    Sig: Apply to affected area BID prn sx    Dispense:  30 g    Refill:  0    Order Specific Question:   Supervising Provider    Answer:   Gae Dry [[id number]]             GYN counsel breast self exam, mammography screening, menopause, adequate intake of calcium and vitamin D, diet and exercise     F/U  Return in about 1 year (around 06/08/2021).  Marylynne Keelin B. Danetra Glock, PA-C 06/08/2020 2:39 PM

## 2020-06-08 ENCOUNTER — Other Ambulatory Visit: Payer: Self-pay

## 2020-06-08 ENCOUNTER — Encounter: Payer: Self-pay | Admitting: Obstetrics and Gynecology

## 2020-06-08 ENCOUNTER — Ambulatory Visit (INDEPENDENT_AMBULATORY_CARE_PROVIDER_SITE_OTHER): Payer: 59 | Admitting: Obstetrics and Gynecology

## 2020-06-08 VITALS — BP 130/90 | Ht 64.0 in | Wt 236.0 lb

## 2020-06-08 DIAGNOSIS — Z1211 Encounter for screening for malignant neoplasm of colon: Secondary | ICD-10-CM | POA: Diagnosis not present

## 2020-06-08 DIAGNOSIS — A6004 Herpesviral vulvovaginitis: Secondary | ICD-10-CM | POA: Diagnosis not present

## 2020-06-08 DIAGNOSIS — B354 Tinea corporis: Secondary | ICD-10-CM

## 2020-06-08 DIAGNOSIS — Z1231 Encounter for screening mammogram for malignant neoplasm of breast: Secondary | ICD-10-CM

## 2020-06-08 DIAGNOSIS — N951 Menopausal and female climacteric states: Secondary | ICD-10-CM | POA: Insufficient documentation

## 2020-06-08 DIAGNOSIS — Z01419 Encounter for gynecological examination (general) (routine) without abnormal findings: Secondary | ICD-10-CM | POA: Diagnosis not present

## 2020-06-08 DIAGNOSIS — L900 Lichen sclerosus et atrophicus: Secondary | ICD-10-CM

## 2020-06-08 MED ORDER — TERCONAZOLE 0.4 % VA CREA: TOPICAL_CREAM | VAGINAL | 0 refills | Status: DC

## 2020-06-08 MED ORDER — CLOBETASOL PROPIONATE 0.05 % EX OINT: TOPICAL_OINTMENT | CUTANEOUS | 0 refills | Status: DC

## 2020-06-08 MED ORDER — PROGESTERONE MICRONIZED 100 MG PO CAPS: ORAL_CAPSULE | ORAL | 0 refills | Status: DC

## 2020-06-08 NOTE — Patient Instructions (Addendum)
I value your feedback and entrusting us with your care. If you get a New Home patient survey, I would appreciate you taking the time to let us know about your experience today. Thank you!  As of November 04, 2019, your lab results will be released to your MyChart immediately, before I even have a chance to see them. Please give me time to review them and contact you if there are any abnormalities. Thank you for your patience.  

## 2020-06-12 NOTE — Addendum Note (Signed)
Addended by: Ardeth Perfect B on: 12/19/4830 04:31 PM   Modules accepted: Level of Service

## 2020-06-13 ENCOUNTER — Encounter: Payer: Self-pay | Admitting: Obstetrics and Gynecology

## 2020-08-13 ENCOUNTER — Other Ambulatory Visit: Payer: Self-pay | Admitting: Obstetrics and Gynecology

## 2020-08-13 DIAGNOSIS — N951 Menopausal and female climacteric states: Secondary | ICD-10-CM

## 2020-10-05 ENCOUNTER — Other Ambulatory Visit: Payer: Self-pay

## 2020-10-05 DIAGNOSIS — Z1211 Encounter for screening for malignant neoplasm of colon: Secondary | ICD-10-CM

## 2020-10-18 ENCOUNTER — Other Ambulatory Visit: Payer: Self-pay

## 2020-10-18 ENCOUNTER — Encounter: Payer: Self-pay | Admitting: Gastroenterology

## 2020-10-18 MED ORDER — SUTAB 1479-225-188 MG PO TABS: 376.0000 mg | ORAL_TABLET | ORAL | 0 refills | Status: DC

## 2020-10-25 ENCOUNTER — Other Ambulatory Visit
Admission: RE | Admit: 2020-10-25 | Discharge: 2020-10-25 | Disposition: A | Discharge status: Home / Self Care | Payer: 59 | Source: Ambulatory Visit | Attending: Gastroenterology | Admitting: Gastroenterology

## 2020-10-25 ENCOUNTER — Other Ambulatory Visit: Payer: Self-pay

## 2020-10-25 DIAGNOSIS — Z888 Allergy status to other drugs, medicaments and biological substances status: Secondary | ICD-10-CM | POA: Diagnosis not present

## 2020-10-25 DIAGNOSIS — I10 Essential (primary) hypertension: Secondary | ICD-10-CM | POA: Diagnosis not present

## 2020-10-25 DIAGNOSIS — Z01812 Encounter for preprocedural laboratory examination: Secondary | ICD-10-CM | POA: Insufficient documentation

## 2020-10-25 DIAGNOSIS — Y838 Other surgical procedures as the cause of abnormal reaction of the patient, or of later complication, without mention of misadventure at the time of the procedure: Secondary | ICD-10-CM | POA: Diagnosis not present

## 2020-10-25 DIAGNOSIS — Z20822 Contact with and (suspected) exposure to covid-19: Secondary | ICD-10-CM | POA: Diagnosis not present

## 2020-10-25 DIAGNOSIS — Z880 Allergy status to penicillin: Secondary | ICD-10-CM | POA: Diagnosis not present

## 2020-10-25 DIAGNOSIS — K625 Hemorrhage of anus and rectum: Secondary | ICD-10-CM | POA: Diagnosis present

## 2020-10-25 DIAGNOSIS — K91840 Postprocedural hemorrhage and hematoma of a digestive system organ or structure following a digestive system procedure: Secondary | ICD-10-CM | POA: Diagnosis not present

## 2020-10-25 DIAGNOSIS — Z1211 Encounter for screening for malignant neoplasm of colon: Secondary | ICD-10-CM | POA: Diagnosis not present

## 2020-10-25 DIAGNOSIS — D122 Benign neoplasm of ascending colon: Secondary | ICD-10-CM | POA: Diagnosis not present

## 2020-10-25 DIAGNOSIS — K921 Melena: Secondary | ICD-10-CM | POA: Diagnosis not present

## 2020-10-25 DIAGNOSIS — E876 Hypokalemia: Secondary | ICD-10-CM | POA: Diagnosis not present

## 2020-10-25 DIAGNOSIS — Z6841 Body Mass Index (BMI) 40.0 and over, adult: Secondary | ICD-10-CM | POA: Diagnosis not present

## 2020-10-25 LAB — SARS CORONAVIRUS 2 (TAT 6-24 HRS): SARS Coronavirus 2: NEGATIVE

## 2020-10-26 NOTE — Discharge Instructions (Signed)
General Anesthesia, Adult, Care After This sheet gives you information about how to care for yourself after your procedure. Your health care provider may also give you more specific instructions. If you have problems or questions, contact your health care provider. What can I expect after the procedure? After the procedure, the following side effects are common:  Pain or discomfort at the IV site.  Nausea.  Vomiting.  Sore throat.  Trouble concentrating.  Feeling cold or chills.  Weak or tired.  Sleepiness and fatigue.  Soreness and body aches. These side effects can affect parts of the body that were not involved in surgery. Follow these instructions at home:  For at least 24 hours after the procedure:  Have a responsible adult stay with you. It is important to have someone help care for you until you are awake and alert.  Rest as needed.  Do not: ? Participate in activities in which you could fall or become injured. ? Drive. ? Use heavy machinery. ? Drink alcohol. ? Take sleeping pills or medicines that cause drowsiness. ? Make important decisions or sign legal documents. ? Take care of children on your own. Eating and drinking  Follow any instructions from your health care provider about eating or drinking restrictions.  When you feel hungry, start by eating small amounts of foods that are soft and easy to digest (bland), such as toast. Gradually return to your regular diet.  Drink enough fluid to keep your urine pale yellow.  If you vomit, rehydrate by drinking water, juice, or clear broth. General instructions  If you have sleep apnea, surgery and certain medicines can increase your risk for breathing problems. Follow instructions from your health care provider about wearing your sleep device: ? Anytime you are sleeping, including during daytime naps. ? While taking prescription pain medicines, sleeping medicines, or medicines that make you drowsy.  Return to  your normal activities as told by your health care provider. Ask your health care provider what activities are safe for you.  Take over-the-counter and prescription medicines only as told by your health care provider.  If you smoke, do not smoke without supervision.  Keep all follow-up visits as told by your health care provider. This is important. Contact a health care provider if:  You have nausea or vomiting that does not get better with medicine.  You cannot eat or drink without vomiting.  You have pain that does not get better with medicine.  You are unable to pass urine.  You develop a skin rash.  You have a fever.  You have redness around your IV site that gets worse. Get help right away if:  You have difficulty breathing.  You have chest pain.  You have blood in your urine or stool, or you vomit blood. Summary  After the procedure, it is common to have a sore throat or nausea. It is also common to feel tired.  Have a responsible adult stay with you for the first 24 hours after general anesthesia. It is important to have someone help care for you until you are awake and alert.  When you feel hungry, start by eating small amounts of foods that are soft and easy to digest (bland), such as toast. Gradually return to your regular diet.  Drink enough fluid to keep your urine pale yellow.  Return to your normal activities as told by your health care provider. Ask your health care provider what activities are safe for you. This information is not   intended to replace advice given to you by your health care provider. Make sure you discuss any questions you have with your health care provider. Document Revised: 11/14/2017 Document Reviewed: 06/27/2017 Elsevier Patient Education  2020 Elsevier Inc.  

## 2020-10-27 ENCOUNTER — Encounter: Payer: Self-pay | Admitting: Gastroenterology

## 2020-10-27 ENCOUNTER — Ambulatory Visit: Payer: 59 | Admitting: Anesthesiology

## 2020-10-27 ENCOUNTER — Other Ambulatory Visit: Payer: Self-pay

## 2020-10-27 ENCOUNTER — Emergency Department: Payer: 59

## 2020-10-27 ENCOUNTER — Encounter
Admission: RE | Disposition: A | Discharge status: Home / Self Care | Payer: Self-pay | Source: Ambulatory Visit | Attending: Gastroenterology

## 2020-10-27 ENCOUNTER — Ambulatory Visit (AMBULATORY_SURGERY_CENTER)
Admission: RE | Admit: 2020-10-27 | Discharge: 2020-10-27 | Disposition: A | Discharge status: Home / Self Care | Payer: 59 | Source: Ambulatory Visit | Attending: Gastroenterology | Admitting: Gastroenterology

## 2020-10-27 DIAGNOSIS — K635 Polyp of colon: Secondary | ICD-10-CM

## 2020-10-27 DIAGNOSIS — E876 Hypokalemia: Secondary | ICD-10-CM | POA: Insufficient documentation

## 2020-10-27 DIAGNOSIS — D122 Benign neoplasm of ascending colon: Secondary | ICD-10-CM | POA: Insufficient documentation

## 2020-10-27 DIAGNOSIS — Z880 Allergy status to penicillin: Secondary | ICD-10-CM | POA: Insufficient documentation

## 2020-10-27 DIAGNOSIS — F419 Anxiety disorder, unspecified: Secondary | ICD-10-CM | POA: Diagnosis present

## 2020-10-27 DIAGNOSIS — K5730 Diverticulosis of large intestine without perforation or abscess without bleeding: Secondary | ICD-10-CM | POA: Insufficient documentation

## 2020-10-27 DIAGNOSIS — Z1211 Encounter for screening for malignant neoplasm of colon: Secondary | ICD-10-CM | POA: Insufficient documentation

## 2020-10-27 DIAGNOSIS — K91840 Postprocedural hemorrhage and hematoma of a digestive system organ or structure following a digestive system procedure: Secondary | ICD-10-CM | POA: Insufficient documentation

## 2020-10-27 DIAGNOSIS — G4730 Sleep apnea, unspecified: Secondary | ICD-10-CM | POA: Diagnosis present

## 2020-10-27 DIAGNOSIS — Z20822 Contact with and (suspected) exposure to covid-19: Secondary | ICD-10-CM | POA: Insufficient documentation

## 2020-10-27 DIAGNOSIS — Z79899 Other long term (current) drug therapy: Secondary | ICD-10-CM

## 2020-10-27 DIAGNOSIS — Z6841 Body Mass Index (BMI) 40.0 and over, adult: Secondary | ICD-10-CM | POA: Insufficient documentation

## 2020-10-27 DIAGNOSIS — Z888 Allergy status to other drugs, medicaments and biological substances status: Secondary | ICD-10-CM | POA: Insufficient documentation

## 2020-10-27 DIAGNOSIS — Z833 Family history of diabetes mellitus: Secondary | ICD-10-CM

## 2020-10-27 DIAGNOSIS — K921 Melena: Secondary | ICD-10-CM | POA: Insufficient documentation

## 2020-10-27 DIAGNOSIS — D120 Benign neoplasm of cecum: Secondary | ICD-10-CM | POA: Insufficient documentation

## 2020-10-27 DIAGNOSIS — Z8249 Family history of ischemic heart disease and other diseases of the circulatory system: Secondary | ICD-10-CM

## 2020-10-27 DIAGNOSIS — Y838 Other surgical procedures as the cause of abnormal reaction of the patient, or of later complication, without mention of misadventure at the time of the procedure: Secondary | ICD-10-CM | POA: Insufficient documentation

## 2020-10-27 DIAGNOSIS — F319 Bipolar disorder, unspecified: Secondary | ICD-10-CM | POA: Diagnosis present

## 2020-10-27 DIAGNOSIS — K219 Gastro-esophageal reflux disease without esophagitis: Secondary | ICD-10-CM | POA: Diagnosis present

## 2020-10-27 DIAGNOSIS — I10 Essential (primary) hypertension: Secondary | ICD-10-CM | POA: Insufficient documentation

## 2020-10-27 DIAGNOSIS — N390 Urinary tract infection, site not specified: Secondary | ICD-10-CM | POA: Diagnosis present

## 2020-10-27 HISTORY — DX: Unspecified osteoarthritis, unspecified site: M19.90

## 2020-10-27 HISTORY — PX: POLYPECTOMY: SHX5525

## 2020-10-27 HISTORY — DX: Dizziness and giddiness: R42

## 2020-10-27 HISTORY — DX: Headache, unspecified: R51.9

## 2020-10-27 HISTORY — PX: COLONOSCOPY WITH PROPOFOL: SHX5780

## 2020-10-27 LAB — CBC WITH DIFFERENTIAL/PLATELET
Abs Immature Granulocytes: 0.01 10*3/uL (ref 0.00–0.07)
Basophils Absolute: 0.1 10*3/uL (ref 0.0–0.1)
Basophils Relative: 1 %
Eosinophils Absolute: 0.1 10*3/uL (ref 0.0–0.5)
Eosinophils Relative: 1 %
HCT: 38.8 % (ref 36.0–46.0)
Hemoglobin: 13.9 g/dL (ref 12.0–15.0)
Immature Granulocytes: 0 %
Lymphocytes Relative: 36 %
Lymphs Abs: 3.1 10*3/uL (ref 0.7–4.0)
MCH: 31.8 pg (ref 26.0–34.0)
MCHC: 35.8 g/dL (ref 30.0–36.0)
MCV: 88.8 fL (ref 80.0–100.0)
Monocytes Absolute: 0.6 10*3/uL (ref 0.1–1.0)
Monocytes Relative: 7 %
Neutro Abs: 4.8 10*3/uL (ref 1.7–7.7)
Neutrophils Relative %: 55 %
Platelets: 225 10*3/uL (ref 150–400)
RBC: 4.37 MIL/uL (ref 3.87–5.11)
RDW: 11.9 % (ref 11.5–15.5)
WBC: 8.6 10*3/uL (ref 4.0–10.5)
nRBC: 0 % (ref 0.0–0.2)

## 2020-10-27 SURGERY — COLONOSCOPY WITH PROPOFOL
Anesthesia: General | Site: Rectum

## 2020-10-27 MED ORDER — SODIUM CHLORIDE 0.9 % IV SOLN: INTRAVENOUS | Status: DC

## 2020-10-27 MED ORDER — STERILE WATER FOR IRRIGATION IR SOLN: Status: DC | PRN

## 2020-10-27 MED ORDER — LACTATED RINGERS IV SOLN: INTRAVENOUS | Status: DC

## 2020-10-27 MED ORDER — ONDANSETRON HCL 4 MG/2ML IJ SOLN: 4.0000 mg | Freq: Once | INTRAMUSCULAR | Status: DC | PRN

## 2020-10-27 MED ORDER — LIDOCAINE HCL (CARDIAC) PF 100 MG/5ML IV SOSY
PREFILLED_SYRINGE | INTRAVENOUS | Status: DC | PRN
  Administered 2020-10-27: 50 mg via INTRAVENOUS

## 2020-10-27 MED ORDER — ACETAMINOPHEN 160 MG/5ML PO SOLN: 325.0000 mg | ORAL | Status: DC | PRN

## 2020-10-27 MED ORDER — ACETAMINOPHEN 325 MG PO TABS: 325.0000 mg | ORAL_TABLET | ORAL | Status: DC | PRN

## 2020-10-27 MED ORDER — PROPOFOL 10 MG/ML IV BOLUS
INTRAVENOUS | Status: DC | PRN
  Administered 2020-10-27 (×2): 30 mg via INTRAVENOUS
  Administered 2020-10-27 (×2): 20 mg via INTRAVENOUS
  Administered 2020-10-27: 150 mg via INTRAVENOUS
  Administered 2020-10-27: 30 mg via INTRAVENOUS
  Administered 2020-10-27: 20 mg via INTRAVENOUS
  Administered 2020-10-27 (×2): 30 mg via INTRAVENOUS
  Administered 2020-10-27: 20 mg via INTRAVENOUS
  Administered 2020-10-27: 30 mg via INTRAVENOUS
  Administered 2020-10-27: 20 mg via INTRAVENOUS
  Administered 2020-10-27 (×3): 30 mg via INTRAVENOUS
  Administered 2020-10-27 (×2): 20 mg via INTRAVENOUS
  Administered 2020-10-27: 30 mg via INTRAVENOUS

## 2020-10-27 SURGICAL SUPPLY — 18 items
CLIP HMST 235XBRD CATH ROT (MISCELLANEOUS) ×2 IMPLANT
CLIP RESOLUTION 360 11X235 (MISCELLANEOUS) ×4
ELECT REM PT RETURN 9FT ADLT (ELECTROSURGICAL) ×2
ELECTRODE REM PT RTRN 9FT ADLT (ELECTROSURGICAL) ×1 IMPLANT
ELEVIEW  INJECTABLE COMP 10 (MISCELLANEOUS) ×1
FORCEPS BIOP RAD 4 LRG CAP 4 (CUTTING FORCEPS) ×2 IMPLANT
GOWN CVR UNV OPN BCK APRN NK (MISCELLANEOUS) ×2 IMPLANT
GOWN ISOL THUMB LOOP REG UNIV (MISCELLANEOUS) ×4
INJECTABLE ELEVIEW COMP 10 (MISCELLANEOUS) ×1 IMPLANT
INJECTOR VARIJECT VIN23 (MISCELLANEOUS) ×1 IMPLANT
KIT PRC NS LF DISP ENDO (KITS) ×1 IMPLANT
KIT PROCEDURE OLYMPUS (KITS) ×2
MANIFOLD NEPTUNE II (INSTRUMENTS) ×2 IMPLANT
SNARE LASSO HEX 3 IN 1 (INSTRUMENTS) ×2 IMPLANT
SNARE SPIRAL (MISCELLANEOUS) ×2 IMPLANT
TRAP ETRAP POLY (MISCELLANEOUS) ×2 IMPLANT
VARIJECT INJECTOR VIN23 (MISCELLANEOUS) ×2
WATER STERILE IRR 250ML POUR (IV SOLUTION) ×2 IMPLANT

## 2020-10-27 NOTE — ED Triage Notes (Signed)
Pt states colonoscopy this am, pt states she now has rectal bleeding, fever of 101 at home, chills.

## 2020-10-27 NOTE — Anesthesia Postprocedure Evaluation (Signed)
Anesthesia Post Note  Patient: Ashley Floyd  Procedure(s) Performed: COLONOSCOPY WITH BIOPSY (N/A Rectum) POLYPECTOMY (N/A Rectum)     Patient location during evaluation: PACU Anesthesia Type: General Level of consciousness: awake Pain management: pain level controlled Vital Signs Assessment: post-procedure vital signs reviewed and stable Respiratory status: respiratory function stable Cardiovascular status: stable Postop Assessment: no signs of nausea or vomiting Anesthetic complications: no   No complications documented.  Veda Canning

## 2020-10-27 NOTE — H&P (Signed)
Ashley Lame, MD [address] [place], [place] [address] Phone: [phone] Fax : [phone]  Primary Care Physician:  Patient, No Pcp Per Primary Gastroenterologist:  Dr. Allen Norris  Pre-Procedure History & Physical: HPI:  Ashley Floyd is a 49 y.o. female is here for a screening colonoscopy.   Past Medical History:  Diagnosis Date  . Anxiety    a. per epic care everywhere & patient  . Arthritis    Toes, knees  . Bipolar affective disorder ([place])   . Complication of anesthesia    PT STATES SHE HAS A SMALL MOUTH AND HAS HAD DENTAL WORK ON HER 2 FRONT TEETH AND SHE WANTS EXTRA CARE TAKEN FOR HER TEETH  . Depression   . Dysmenorrhea   . Endometriosis   . Family history of adverse reaction to anesthesia    PTS MOM HAS TROUBLE WITH ANESTHESIA DUE TO HER SMOKING HISTORY  . GERD (gastroesophageal reflux disease)   . Headache    sinus/stress/BP  . Hemorrhoid   . Herpes genitalis   . History of mammogram 03/22/2015   BI-RADS 1 AT [facility]  . History of nuclear stress test    a. 08/2015: no EKG changes concerning for ischemia, normal wall motion, EF >60%, low risk study  . History of Papanicolaou smear of cervix 03/08/2015   -/-  . Hypertension   . IBS (irritable bowel syndrome)   . Infertility, female    [facility] PT IN THE PAST  . Obesity   . Ovarian cyst 1998   right  . PMDD (premenstrual dysphoric disorder)   . PONV (postoperative nausea and vomiting)   . Shortness of breath dyspnea   . Sinus infection   . Sleep apnea    "MILD"-NO CPAP-LAST SLEEP STUDY YEARS AGO  . Vertigo    rare    Past Surgical History:  Procedure Laterality Date  . CHOLECYSTECTOMY N/A 12/07/2015   Procedure: LAPAROSCOPIC CHOLECYSTECTOMY ;  Surgeon: Robert Bellow, MD;  Location: [facility] ORS;  Service: General;  Laterality: N/A;  . DIAGNOSTIC LAPAROSCOPY  1988; 2006   OSIS; OVARIAN CYST (2006 AT [facility])  . OVARIAN CYST REMOVAL    . UPPER GI ENDOSCOPY  2015   Dr Allen Norris  . URETHRAL DILATION       Prior to Admission medications   Medication Sig Start Date End Date Taking? Authorizing Provider  ALREX 0.2 % SUSP USE 1 DROP(S) IN BOTH EYES 4 TIMES A DAY AS NEEDED [PRN] 11/28/18  Yes [provider]  carbamazepine (TEGRETOL) 200 MG tablet Take 800 mg by mouth at bedtime.   Yes [provider]  clonazePAM (KLONOPIN) 1 MG tablet Take 3 mg by mouth at bedtime.  01/30/15  Yes [provider]  DEXILANT 60 MG capsule TAKE 1 CAPSULE BY MOUTH  DAILY 12/28/19  Yes Ashley Lame, MD  diphenhydrAMINE (BENADRYL) 25 MG tablet Take 25 mg by mouth at bedtime as needed. Reported on 05/23/2016   Yes [provider]  hydrALAZINE (APRESOLINE) 25 MG tablet Take 1 tablet (25 mg total) by mouth 2 (two) times daily as needed (high pressure, headache). 05/30/20  Yes Minna Merritts, MD  metoprolol tartrate (LOPRESSOR) 25 MG tablet Take 1 tablet (25 mg total) by mouth 2 (two) times daily as needed (tachycardia). 05/30/20  Yes Minna Merritts, MD  naproxen sodium (ANAPROX) 550 MG tablet Take 1 tablet (550 mg total) by mouth 2 (two) times daily with a meal. Prn sx 05/03/19  Yes Copland,  Deirdre Evener, PA-C  triamcinolone (NASACORT) 55 MCG/ACT AERO nasal inhaler Place 2 sprays into the nose daily. Reported on 05/23/2016   Yes [provider]  valACYclovir (VALTREX) 500 MG tablet TAKE 1 TABLET BY MOUTH 2  TIMES DAILY FOR 3 DAYS AS  NEEDED FOR SYMPTOMS 1/47/82  Yes Copland, Alicia B, PA-C  zaleplon (SONATA) 10 MG capsule Take 10 mg by mouth daily.    Yes [provider]  clobetasol ointment (TEMOVATE) 0.05 % Apply to affected area BID prn sx 9/56/21   Copland, Alicia B, PA-C  Doxepin HCl 6 MG TABS Take 1 tablet by mouth at bedtime. Patient not taking: Reported on 10/18/2020 05/02/20   [provider]  losartan-hydrochlorothiazide (HYZAAR) 50-12.5 MG tablet Take 1 tablet by mouth daily. Patient not taking: Reported on 10/18/2020 05/30/20   Minna Merritts, MD  progesterone  (PROMETRIUM) 100 MG capsule Take 1 cap nightly, 6 nights on, 1 night off Patient not taking: Reported on 10/18/2020 01/31/64   Copland, Deirdre Evener, PA-C  Sodium Sulfate-Mag Sulfate-KCl (SUTAB) [id number] MG TABS Take 376 mg by mouth as directed. 10/18/20   Ashley Lame, MD  terconazole (TERAZOL 7) 0.4 % vaginal cream AAA BID prn sx 8/41/32   Copland, Alicia B, PA-C    Allergies as of 10/05/2020 - Review Complete 06/08/2020  Allergen Reaction Noted  . Cephalosporins Rash and Itching 03/22/2015  . Penicillins Other (See Comments) 03/22/2015  . Adhesive [tape] Rash 11/30/2015  . Cefdinir Itching 07/27/2015    Family History  Problem Relation Age of Onset  . Arrhythmia Mother   . Cancer Mother        MELANOMA OF SKIN  . Broken bones Mother        neck  . Hypertension Maternal Grandmother   . Hypertension Maternal Grandfather   . Heart disease Paternal Grandmother   . Heart attack Paternal Grandmother   . Diabetes Paternal Grandmother   . Diabetes Father   . Cancer Paternal Grandfather        MAL NEO OF SKIN; LUNGS  . Heart failure Maternal Aunt   . Heart disease Maternal Aunt        CABG  . Diabetes Paternal Aunt   . Diabetes Other     Social History   Socioeconomic History  . Marital status: Legally Separated    Spouse name: Not on file  . Number of children: 0  . Years of education: 79  . Highest education level: Not on file  Occupational History  . Not on file  Tobacco Use  . Smoking status: Never Smoker  . Smokeless tobacco: Never Used  Vaping Use  . Vaping Use: Never used  Substance and Sexual Activity  . Alcohol use: Not Currently    Alcohol/week: 0.0 standard drinks    Comment: rarely  . Drug use: No  . Sexual activity: Not Currently    Birth control/protection: Post-menopausal  Other Topics Concern  . Not on file  Social History Narrative  . Not on file   Social Determinants of Health   Financial Resource Strain:   . Difficulty of Paying Living  Expenses: Not on file  Food Insecurity:   . Worried About Charity fundraiser in the Last Year: Not on file  . Ran Out of Food in the Last Year: Not on file  Transportation Needs:   . Lack of Transportation (Medical): Not on file  . Lack of Transportation (Non-Medical): Not on file  Physical Activity:   .  Days of Exercise per Week: Not on file  . Minutes of Exercise per Session: Not on file  Stress:   . Feeling of Stress : Not on file  Social Connections:   . Frequency of Communication with Friends and Family: Not on file  . Frequency of Social Gatherings with Friends and Family: Not on file  . Attends Religious Services: Not on file  . Active Member of Clubs or Organizations: Not on file  . Attends Archivist Meetings: Not on file  . Marital Status: Not on file  Intimate Partner Violence:   . Fear of Current or Ex-Partner: Not on file  . Emotionally Abused: Not on file  . Physically Abused: Not on file  . Sexually Abused: Not on file    Review of Systems: See HPI, otherwise negative ROS  Physical Exam: BP (!) 154/88   Pulse 97   Temp (!) 97.3 F (36.3 C)   Ht 5\' 4"  (1.626 m)   Wt 108 kg   LMP 11/15/2019 (Exact Date)   SpO2 98%   BMI 40.85 kg/m  General:   Alert,  pleasant and cooperative in NAD Head:  Normocephalic and atraumatic. Neck:  Supple; no masses or thyromegaly. Lungs:  Clear throughout to auscultation.    Heart:  Regular rate and rhythm. Abdomen:  Soft, nontender and nondistended. Normal bowel sounds, without guarding, and without rebound.   Neurologic:  Alert and  oriented x4;  grossly normal neurologically.  Impression/Plan: Ashley Floyd is now here to undergo a screening colonoscopy.  Risks, benefits, and alternatives regarding colonoscopy have been reviewed with the patient.  Questions have been answered.  All parties agreeable.

## 2020-10-27 NOTE — Op Note (Signed)
[facility] Gastroenterology Patient Name: Ashley Floyd Procedure Date: 10/27/2020 9:11 AM MRN: [id number] Account #: [id number] Date of Birth: [birth year] Admit Type: Outpatient Age: 49 Room: [facility] OR ROOM 01 Gender: Female Note Status: Finalized Procedure:             Colonoscopy Indications:           Screening for colorectal malignant neoplasm Providers:             [name] MD, MD Medicines:             Propofol per Anesthesia Complications:         No immediate complications. Procedure:             Pre-Anesthesia Assessment:                        - Prior to the procedure, a History and Physical was                         performed, and patient medications and allergies were                         reviewed. The patient's tolerance of previous                         anesthesia was also reviewed. The risks and benefits                         of the procedure and the sedation options and risks                         were discussed with the patient. All questions were                         answered, and informed consent was obtained. Prior                         Anticoagulants: The patient has taken no previous                         anticoagulant or antiplatelet agents. ASA Grade                         Assessment: II - A patient with mild systemic disease.                         After reviewing the risks and benefits, the patient                         was deemed in satisfactory condition to undergo the                         procedure.                        After obtaining informed consent, the colonoscope was                         passed under direct vision. Throughout the procedure,  the patient's blood pressure, pulse, and oxygen                         saturations were monitored continuously. The                         Colonoscope was introduced through the anus and                         advanced to the the cecum,  identified by appendiceal                         orifice and ileocecal valve. The colonoscopy was                         performed without difficulty. The patient tolerated                         the procedure well. The quality of the bowel                         preparation was excellent. Findings:      The perianal and digital rectal examinations were normal.      A 15 mm polyp was found in the cecum. The polyp was sessile.       Preparations were made for mucosal resection. Chromoscopy with indigo       carmine was done to mark the borders of the lesion. Saline with indigo       carmine was injected to raise the lesion. Snare mucosal resection was       performed. Resection and retrieval were complete. To close a defect       after polypectomy, two hemostatic clips were successfully placed (MR       conditional). There was no bleeding at the end of the procedure. This       was biopsied with a cold forceps for histology.      A 3 mm polyp was found in the cecum. The polyp was sessile. The polyp       was removed with a cold snare. Resection and retrieval were complete.      Four sessile polyps were found in the ascending colon. The polyps were 5       to 6 mm in size. These polyps were removed with a cold snare. Resection       and retrieval were complete.      Multiple small-mouthed diverticula were found in the sigmoid colon. Impression:            - One 15 mm polyp in the cecum, removed with mucosal                         resection. Resected and retrieved. Clips (MR                         conditional) were placed. Biopsied.                        - One 3 mm polyp in the cecum, removed with a cold  snare. Resected and retrieved.                        - Four 5 to 6 mm polyps in the ascending colon,                         removed with a cold snare. Resected and retrieved.                        - Diverticulosis in the sigmoid colon.                         - Mucosal resection was performed. Resection and                         retrieval were complete. Recommendation:        - Discharge patient to home.                        - Resume previous diet.                        - Continue present medications.                        - Await pathology results.                        - Repeat colonoscopy in 6 months for surveillance. Procedure Code(s):     --- Professional ---                        [phone], Colonoscopy, flexible; with endoscopic mucosal                         resection                        [id number], 43, Colonoscopy, flexible; with removal of                         tumor(s), polyp(s), or other lesion(s) by snare                         technique Diagnosis Code(s):     --- Professional ---                        Z12.11, Encounter for screening for malignant neoplasm                         of colon                        K63.5, Polyp of colon CPT copyright 2019 American Medical Association. All rights reserved. The codes documented in this report are preliminary and upon coder review may  be revised to meet current compliance requirements. [name] MD, MD 10/27/2020 10:01:00 AM This report has been signed electronically. Number of Addenda: 0 Note Initiated On: 10/27/2020 9:11 AM Scope Withdrawal Time: 0 hours 34 minutes 40 seconds  Total Procedure Duration: 0 hours 38 minutes 49 seconds  Estimated Blood Loss:  Estimated blood loss: none.      [facility]

## 2020-10-27 NOTE — Transfer of Care (Signed)
Immediate Anesthesia Transfer of Care Note  Patient: Ashley Floyd  Procedure(s) Performed: COLONOSCOPY WITH BIOPSY (N/A Rectum) POLYPECTOMY (N/A Rectum)  Patient Location: PACU  Anesthesia Type: General  Level of Consciousness: awake, alert  and patient cooperative  Airway and Oxygen Therapy: Patient Spontanous Breathing and Patient connected to supplemental oxygen  Post-op Assessment: Post-op Vital signs reviewed, Patient's Cardiovascular Status Stable, Respiratory Function Stable, Patent Airway and No signs of Nausea or vomiting  Post-op Vital Signs: Reviewed and stable  Complications: No complications documented.

## 2020-10-27 NOTE — Anesthesia Preprocedure Evaluation (Signed)
Anesthesia Evaluation  Patient identified by MRN, date of birth, ID band Patient awake    Reviewed: Allergy & Precautions, NPO status   History of Anesthesia Complications (+) PONV  Airway Mallampati: II  TM Distance: >3 FB     Dental   Pulmonary sleep apnea (mild, no cpap) ,    breath sounds clear to auscultation       Cardiovascular hypertension,  Rhythm:Regular Rate:Normal     Neuro/Psych  Headaches, PSYCHIATRIC DISORDERS Anxiety Depression Bipolar Disorder    GI/Hepatic GERD  ,IBS   Endo/Other  Morbid obesity (BMi 41)  Renal/GU      Musculoskeletal   Abdominal   Peds  Hematology   Anesthesia Other Findings   Reproductive/Obstetrics                             Anesthesia Physical Anesthesia Plan  ASA: III  Anesthesia Plan: General   Post-op Pain Management:    Induction: Intravenous  PONV Risk Score and Plan: Propofol infusion, TIVA and Treatment may vary due to age or medical condition  Airway Management Planned: Natural Airway and Nasal Cannula  Additional Equipment:   Intra-op Plan:   Post-operative Plan:   Informed Consent: I have reviewed the patients History and Physical, chart, labs and discussed the procedure including the risks, benefits and alternatives for the proposed anesthesia with the patient or authorized representative who has indicated his/her understanding and acceptance.       Plan Discussed with: CRNA  Anesthesia Plan Comments:         Anesthesia Quick Evaluation

## 2020-10-27 NOTE — Anesthesia Procedure Notes (Signed)
Date/Time: 10/27/2020 9:12 AM Performed by: Cameron Ali, CRNA Pre-anesthesia Checklist: Patient identified, Emergency Drugs available, Suction available, Timeout performed and Patient being monitored Patient Re-evaluated:Patient Re-evaluated prior to induction Oxygen Delivery Method: Nasal cannula Placement Confirmation: positive ETCO2

## 2020-10-28 ENCOUNTER — Observation Stay
Admission: EM | Admit: 2020-10-28 | Discharge: 2020-10-28 | Disposition: A | Discharge status: Home / Self Care | Payer: 59 | Attending: Internal Medicine | Admitting: Internal Medicine

## 2020-10-28 ENCOUNTER — Other Ambulatory Visit: Payer: Self-pay

## 2020-10-28 DIAGNOSIS — I10 Essential (primary) hypertension: Secondary | ICD-10-CM | POA: Diagnosis present

## 2020-10-28 DIAGNOSIS — K625 Hemorrhage of anus and rectum: Secondary | ICD-10-CM

## 2020-10-28 DIAGNOSIS — Z9889 Other specified postprocedural states: Secondary | ICD-10-CM

## 2020-10-28 DIAGNOSIS — K921 Melena: Secondary | ICD-10-CM | POA: Diagnosis not present

## 2020-10-28 DIAGNOSIS — R509 Fever, unspecified: Secondary | ICD-10-CM

## 2020-10-28 DIAGNOSIS — Z86010 Personal history of colonic polyps: Secondary | ICD-10-CM

## 2020-10-28 DIAGNOSIS — E66813 Obesity, class 3: Secondary | ICD-10-CM

## 2020-10-28 LAB — COMPREHENSIVE METABOLIC PANEL
ALT: 25 U/L (ref 0–44)
AST: 42 U/L — ABNORMAL HIGH (ref 15–41)
Albumin: 3.5 g/dL (ref 3.5–5.0)
Alkaline Phosphatase: 80 U/L (ref 38–126)
Anion gap: 14 (ref 5–15)
BUN: 9 mg/dL (ref 6–20)
CO2: 27 mmol/L (ref 22–32)
Calcium: 8.4 mg/dL — ABNORMAL LOW (ref 8.9–10.3)
Chloride: 99 mmol/L (ref 98–111)
Creatinine, Ser: 0.68 mg/dL (ref 0.44–1.00)
GFR, Estimated: >60 mL/min (ref >60)
Glucose, Bld: 110 mg/dL — ABNORMAL HIGH (ref 70–99)
Potassium: 3.6 mmol/L (ref 3.5–5.1)
Sodium: 140 mmol/L (ref 135–145)
Total Bilirubin: 1 mg/dL (ref 0.3–1.2)
Total Protein: 6.7 g/dL (ref 6.5–8.1)

## 2020-10-28 LAB — PROTIME-INR
INR: 1.1 (ref 0.8–1.2)
Prothrombin Time: 13.3 seconds (ref 11.4–15.2)

## 2020-10-28 LAB — CBC
HCT: 33.2 % — ABNORMAL LOW (ref 36.0–46.0)
Hemoglobin: 11.9 g/dL — ABNORMAL LOW (ref 12.0–15.0)
MCH: 31.9 pg (ref 26.0–34.0)
MCHC: 35.8 g/dL (ref 30.0–36.0)
MCV: 89 fL (ref 80.0–100.0)
Platelets: 185 10*3/uL (ref 150–400)
RBC: 3.73 MIL/uL — ABNORMAL LOW (ref 3.87–5.11)
RDW: 12.1 % (ref 11.5–15.5)
WBC: 6.1 10*3/uL (ref 4.0–10.5)
nRBC: 0 % (ref 0.0–0.2)

## 2020-10-28 LAB — PROCALCITONIN: Procalcitonin: <0.1 ng/mL

## 2020-10-28 LAB — URINALYSIS, COMPLETE (UACMP) WITH MICROSCOPIC
Bilirubin Urine: NEGATIVE
Glucose, UA: NEGATIVE mg/dL
Hgb urine dipstick: NEGATIVE
Ketones, ur: NEGATIVE mg/dL
Leukocytes,Ua: NEGATIVE
Nitrite: NEGATIVE
Protein, ur: NEGATIVE mg/dL
Specific Gravity, Urine: 1.016 (ref 1.005–1.030)
pH: 5 (ref 5.0–8.0)

## 2020-10-28 LAB — TYPE AND SCREEN
ABO/RH(D): A POS
Antibody Screen: NEGATIVE

## 2020-10-28 LAB — BASIC METABOLIC PANEL
Anion gap: 11 (ref 5–15)
BUN: 10 mg/dL (ref 6–20)
CO2: 30 mmol/L (ref 22–32)
Calcium: 8.6 mg/dL — ABNORMAL LOW (ref 8.9–10.3)
Chloride: 97 mmol/L — ABNORMAL LOW (ref 98–111)
Creatinine, Ser: 0.66 mg/dL (ref 0.44–1.00)
GFR, Estimated: >60 mL/min (ref >60)
Glucose, Bld: 102 mg/dL — ABNORMAL HIGH (ref 70–99)
Potassium: 2.9 mmol/L — ABNORMAL LOW (ref 3.5–5.1)
Sodium: 138 mmol/L (ref 135–145)

## 2020-10-28 LAB — LACTIC ACID, PLASMA
Lactic Acid, Venous: 1.5 mmol/L (ref 0.5–1.9)
Lactic Acid, Venous: 1.7 mmol/L (ref 0.5–1.9)

## 2020-10-28 LAB — HIV ANTIBODY (ROUTINE TESTING W REFLEX): HIV Screen 4th Generation wRfx: NONREACTIVE

## 2020-10-28 MED ORDER — METRONIDAZOLE 500 MG PO TABS
500.0000 mg | ORAL_TABLET | Freq: Three times a day (TID) | ORAL | Status: DC
  Administered 2020-10-28 (×2): 500 mg via ORAL
  Filled 2020-10-28 (×2): qty 1

## 2020-10-28 MED ORDER — LOSARTAN POTASSIUM-HCTZ 50-12.5 MG PO TABS: 1.0000 | ORAL_TABLET | Freq: Every day | ORAL | Status: DC

## 2020-10-28 MED ORDER — NAPROXEN 500 MG PO TABS: 500.0000 mg | ORAL_TABLET | Freq: Two times a day (BID) | ORAL | Status: DC | PRN

## 2020-10-28 MED ORDER — LOSARTAN POTASSIUM 50 MG PO TABS
50.0000 mg | ORAL_TABLET | Freq: Every day | ORAL | Status: DC
  Administered 2020-10-28: 50 mg via ORAL
  Filled 2020-10-28: qty 1

## 2020-10-28 MED ORDER — ACETAMINOPHEN 325 MG PO TABS: 650.0000 mg | ORAL_TABLET | Freq: Four times a day (QID) | ORAL | Status: DC | PRN

## 2020-10-28 MED ORDER — PANTOPRAZOLE SODIUM 40 MG PO TBEC
40.0000 mg | DELAYED_RELEASE_TABLET | Freq: Every day | ORAL | Status: DC
  Administered 2020-10-28: 40 mg via ORAL
  Filled 2020-10-28: qty 1

## 2020-10-28 MED ORDER — METOPROLOL TARTRATE 25 MG PO TABS
25.0000 mg | ORAL_TABLET | Freq: Two times a day (BID) | ORAL | Status: DC
  Filled 2020-10-28: qty 1

## 2020-10-28 MED ORDER — CLONAZEPAM 0.5 MG PO TABS: 3.0000 mg | ORAL_TABLET | Freq: Every day | ORAL | Status: DC

## 2020-10-28 MED ORDER — ACETAMINOPHEN 650 MG RE SUPP: 650.0000 mg | Freq: Four times a day (QID) | RECTAL | Status: DC | PRN

## 2020-10-28 MED ORDER — HYDROCHLOROTHIAZIDE 12.5 MG PO CAPS
12.5000 mg | ORAL_CAPSULE | Freq: Every day | ORAL | Status: DC
  Administered 2020-10-28: 12.5 mg via ORAL
  Filled 2020-10-28: qty 1

## 2020-10-28 MED ORDER — HYDRALAZINE HCL 50 MG PO TABS: 25.0000 mg | ORAL_TABLET | Freq: Two times a day (BID) | ORAL | Status: DC | PRN

## 2020-10-28 MED ORDER — TRIAMCINOLONE ACETONIDE 55 MCG/ACT NA AERO
2.0000 | INHALATION_SPRAY | Freq: Every day | NASAL | Status: DC
  Filled 2020-10-28: qty 21.6

## 2020-10-28 MED ORDER — SODIUM CHLORIDE 0.9 % IV SOLN: INTRAVENOUS | Status: DC

## 2020-10-28 MED ORDER — DEXILANT 60 MG PO CPDR
60.0000 mg | DELAYED_RELEASE_CAPSULE | Freq: Every evening | ORAL | 0 refills | Status: DC
Start: 2020-10-28 — End: 2021-01-09

## 2020-10-28 MED ORDER — ONDANSETRON HCL 4 MG/2ML IJ SOLN: 4.0000 mg | Freq: Four times a day (QID) | INTRAMUSCULAR | Status: DC | PRN

## 2020-10-28 MED ORDER — CARBAMAZEPINE 200 MG PO TABS: 800.0000 mg | ORAL_TABLET | Freq: Every day | ORAL | Status: DC

## 2020-10-28 MED ORDER — LEVOFLOXACIN 500 MG PO TABS: 500.0000 mg | ORAL_TABLET | Freq: Every day | ORAL | 0 refills | Status: AC

## 2020-10-28 MED ORDER — SODIUM CHLORIDE 0.9 % IV BOLUS
1000.0000 mL | Freq: Once | INTRAVENOUS | Status: AC
  Administered 2020-10-28: 1000 mL via INTRAVENOUS

## 2020-10-28 MED ORDER — DIPHENHYDRAMINE HCL 25 MG PO CAPS: 25.0000 mg | ORAL_CAPSULE | Freq: Every evening | ORAL | Status: DC | PRN

## 2020-10-28 MED ORDER — ONDANSETRON HCL 4 MG PO TABS
4.0000 mg | ORAL_TABLET | Freq: Four times a day (QID) | ORAL | Status: DC | PRN
  Administered 2020-10-28: 4 mg via ORAL
  Filled 2020-10-28: qty 1

## 2020-10-28 MED ORDER — LEVOFLOXACIN 500 MG PO TABS
500.0000 mg | ORAL_TABLET | Freq: Every day | ORAL | Status: DC
  Administered 2020-10-28: 500 mg via ORAL
  Filled 2020-10-28: qty 1

## 2020-10-28 MED ORDER — DEXILANT 60 MG PO CPDR
60.0000 mg | DELAYED_RELEASE_CAPSULE | Freq: Every evening | ORAL | Status: DC
Start: 2020-10-28 — End: 2020-10-28

## 2020-10-28 NOTE — Discharge Summary (Signed)
Physician Discharge Summary  Ashley Floyd:[id number] DOB: [birth year] DOA: 10/28/2020  PCP: Patient, No Pcp Per  Admit date: 10/28/2020 Discharge date: 10/28/2020  Discharge disposition: Home   Recommendations for Outpatient Follow-Up:   Follow-up with PCP in 1 week   Discharge Diagnosis:   Principal Problem:   Hematochezia Active Problems:   Obesity, Class III, BMI 40-49.9 (morbid obesity) ([place])   Essential hypertension   H/O colonoscopy with polypectomy 10/26/20    Discharge Condition: Stable.  Diet recommendation:  Diet Order            Diet - low sodium heart healthy                   Code Status: Prior     Hospital Course:   Ms.Ashley Floyd is a 49 y.o. female with medical history significant for HTN, obesity, anxiety and arthritis who presented with rectal bleeding s/p colonoscopy on 10/27/2020.  Several polyps were removed during colonoscopy.  She said she passed bright red blood per rectum on at least 2 occasions and there were blood clots. She also complained of fever, chills, generalized weakness and dysuria.   She was treated with IV fluids.  She was also treated with antibiotics for UTI.  She was seen by the gastroenterologist, Dr. Allen Norris, who said patient could be discharged home from his standpoint.  Dr. Allen Norris and I were at the bedside together.  Patient is feeling better.  Rectal bleeding has resolved.  She is deemed stable for discharge to home today.  Discharge plan was discussed with the patient and her spouse at the bedside.  Of note, patient was expected to spend at least 2 midnights in the hospital.  However, her condition improved rather quickly and she was discharged within 30 hours of admission.   Addendum:  Patient also had hypokalemia.  Prescription of potassium chloride 40 mEq daily for 2 days was called into [facility] pharmacy [phone]).  I called patient on her cell phone and asked her to pick up the potassium chloride prescription  today.   Medical Consultants:    Gastroenterologist   Discharge Exam:    Vitals:   10/28/20 0900 10/28/20 0915 10/28/20 1000 10/28/20 1100  BP: 134/67  (!) 135/91 137/87  Pulse: 71 70 84 75  Resp: 16  16 18   Temp:    98.2 F (36.8 C)  TempSrc:      SpO2: 98% 96% 97% 96%  Weight:      Height:         GEN: NAD SKIN: Warm and dry EYES: EOMI, no pallor or icterus ENT: MMM CV: RRR PULM: CTA B ABD: soft, obese, NT, +BS CNS: AAO x 3, non focal EXT: No edema or tenderness   The results of significant diagnostics from this hospitalization (including imaging, microbiology, ancillary and laboratory) are listed below for reference.     Procedures and Diagnostic Studies:   DG Chest 2 View  Result Date: 10/28/2020 CLINICAL DATA:  Fever EXAM: CHEST - 2 VIEW COMPARISON:  None. FINDINGS: The heart size and mediastinal contours are within normal limits. Both lungs are clear. The visualized skeletal structures are unremarkable. IMPRESSION: No active cardiopulmonary disease. Electronically Signed   By: Prudencio Pair M.D.   On: 10/28/2020 00:14     Labs:   Basic Metabolic Panel: Recent Labs  Lab 10/27/20 2330 10/28/20 0100  NA 140 138  K 3.6 2.9*  CL 99 97*  CO2 27 30  GLUCOSE  110* 102*  BUN 9 10  CREATININE 0.68 0.66  CALCIUM 8.4* 8.6*   GFR Estimated Creatinine Clearance: 99.8 mL/min (by C-G formula based on SCr of 0.66 mg/dL). Liver Function Tests: Recent Labs  Lab 10/27/20 2330  AST 42*  ALT 25  ALKPHOS 80  BILITOT 1.0  PROT 6.7  ALBUMIN 3.5   No results for input(s): LIPASE, AMYLASE in the last 168 hours. No results for input(s): AMMONIA in the last 168 hours. Coagulation profile Recent Labs  Lab 10/28/20 0001  INR 1.1    CBC: Recent Labs  Lab 10/27/20 2330 10/28/20 0100  WBC 8.6 6.1  NEUTROABS 4.8  --   HGB 13.9 11.9*  HCT 38.8 33.2*  MCV 88.8 89.0  PLT 225 185   Cardiac Enzymes: No results for input(s): CKTOTAL, CKMB, CKMBINDEX,  TROPONINI in the last 168 hours. BNP: Invalid input(s): POCBNP CBG: No results for input(s): GLUCAP in the last 168 hours. D-Dimer No results for input(s): DDIMER in the last 72 hours. Hgb A1c No results for input(s): HGBA1C in the last 72 hours. Lipid Profile No results for input(s): CHOL, HDL, LDLCALC, TRIG, CHOLHDL, LDLDIRECT in the last 72 hours. Thyroid function studies No results for input(s): TSH, T4TOTAL, T3FREE, THYROIDAB in the last 72 hours.  Invalid input(s): FREET3 Anemia work up No results for input(s): VITAMINB12, FOLATE, FERRITIN, TIBC, IRON, RETICCTPCT in the last 72 hours. Microbiology Recent Results (from the past 240 hour(s))  SARS CORONAVIRUS 2 (TAT 6-24 HRS) Nasopharyngeal Nasopharyngeal Swab     Status: None   Collection Time: 10/25/20 11:24 AM   Specimen: Nasopharyngeal Swab  Result Value Ref Range Status   SARS Coronavirus 2 NEGATIVE NEGATIVE Final    Comment: (NOTE) SARS-CoV-2 target nucleic acids are NOT DETECTED.  The SARS-CoV-2 RNA is generally detectable in upper and lower respiratory specimens during the acute phase of infection. Negative results do not preclude SARS-CoV-2 infection, do not rule out co-infections with other pathogens, and should not be used as the sole basis for treatment or other patient management decisions. Negative results must be combined with clinical observations, patient history, and epidemiological information. The expected result is Negative.  Fact Sheet for Patients: SugarRoll.be  Fact Sheet for Healthcare Providers: https://www.woods-mathews.com/  This test is not yet approved or cleared by the [place] FDA and  has been authorized for detection and/or diagnosis of SARS-CoV-2 by FDA under an Emergency Use Authorization (EUA). This EUA will remain  in effect (meaning this test can be used) for the duration of the COVID-19 declaration under Se ction 564(b)(1) of the  Act, 21 U.S.C. section 360bbb-3(b)(1), unless the authorization is terminated or revoked sooner.  Performed at [facility], [place] [address] [place], [place] [address]   Culture, blood (routine x 2)     Status: None (Preliminary result)   Collection Time: 10/28/20 12:00 AM   Specimen: BLOOD  Result Value Ref Range Status   Specimen Description BLOOD LEFT ASSIST CONTROL  Final   Special Requests   Final    BOTTLES DRAWN AEROBIC AND ANAEROBIC Blood Culture adequate volume   Culture   Final    NO GROWTH < 12 HOURS Performed at [facility], [address] [place], [place] [address]    Report Status PENDING  Incomplete  Culture, blood (routine x 2)     Status: None (Preliminary result)   Collection Time: 10/28/20 12:00 AM   Specimen: BLOOD  Result Value Ref Range Status   Specimen  Description BLOOD LEFT FOREARM  Final   Special Requests   Final    BOTTLES DRAWN AEROBIC AND ANAEROBIC Blood Culture results may not be optimal due to an inadequate volume of blood received in culture bottles   Culture   Final    NO GROWTH < 12 HOURS Performed at [facility], [address] [place], [place] [address]    Report Status PENDING  Incomplete     Discharge Instructions:   Discharge Instructions    Diet - low sodium heart healthy   Complete by: As directed    Discharge instructions   Complete by: As directed    Hold naproxen because of rectal bleeding   Increase activity slowly   Complete by: As directed      Allergies as of 10/28/2020      Reactions   Cephalosporins Rash, Itching   Progesterone    Visual problems   Adhesive [tape] Rash   "VERY SENSITIVE SKIN"  Causes scarring    Cefdinir Itching   Legs itch   Penicillins Rash, Other (See Comments)   Yeast      Medication List    STOP taking these medications   Doxepin HCl 6 MG Tabs   naproxen sodium 550 MG tablet Commonly known as: ANAPROX   progesterone 100 MG capsule Commonly known as:  Prometrium   Sutab [phone] MG Tabs Generic drug: Sodium Sulfate-Mag Sulfate-KCl     TAKE these medications   Alrex 0.2 % Susp Generic drug: loteprednol USE 1 DROP(S) IN BOTH EYES 4 TIMES A DAY AS NEEDED [PRN]   carbamazepine 200 MG tablet Commonly known as: TEGRETOL Take 800 mg by mouth at bedtime.   clobetasol ointment 0.05 % Commonly known as: TEMOVATE Apply to affected area BID prn sx   clonazePAM 1 MG tablet Commonly known as: KLONOPIN Take 3 mg by mouth at bedtime.   Dexilant 60 MG capsule Generic drug: dexlansoprazole Take 1 capsule (60 mg total) by mouth at bedtime.   diphenhydrAMINE 25 MG tablet Commonly known as: BENADRYL Take 25 mg by mouth at bedtime as needed. Reported on 05/23/2016   hydrALAZINE 25 MG tablet Commonly known as: APRESOLINE Take 1 tablet (25 mg total) by mouth 2 (two) times daily as needed (high pressure, headache).   levofloxacin 500 MG tablet Commonly known as: LEVAQUIN Take 1 tablet (500 mg total) by mouth daily for 2 days. Start taking on: October 29, 2020   losartan-hydrochlorothiazide 50-12.5 MG tablet Commonly known as: Hyzaar Take 1 tablet by mouth daily.   metoprolol tartrate 25 MG tablet Commonly known as: LOPRESSOR Take 1 tablet (25 mg total) by mouth 2 (two) times daily as needed (tachycardia).   olopatadine 0.1 % ophthalmic solution Commonly known as: PATANOL Place 1 drop into both eyes 4 (four) times daily as needed.   terconazole 0.4 % vaginal cream Commonly known as: TERAZOL 7 AAA BID prn sx   triamcinolone 55 MCG/ACT Aero nasal inhaler Commonly known as: NASACORT Place 2 sprays into the nose daily. Reported on 05/23/2016   valACYclovir 500 MG tablet Commonly known as: VALTREX TAKE 1 TABLET BY MOUTH 2  TIMES DAILY FOR 3 DAYS AS  NEEDED FOR SYMPTOMS   zaleplon 10 MG capsule Commonly known as: SONATA Take 10 mg by mouth daily.         Time coordinating discharge: 35 minutes  Signed:  Jlynn Ly  [facility] 10/28/2020, 4:55 PM   Pager on [email or web address]. If 7PM-7AM, please contact night-coverage  at [email or web address]

## 2020-10-28 NOTE — H&P (Addendum)
History and Physical    Ashley Floyd LFY:[id number] DOB: 1971/09/14 DOA: 10/28/2020  PCP: Patient, No Pcp Per   Patient coming from: Home  I have personally briefly reviewed patient's old medical records in [place]  Chief Complaint: Rectal bleeding  HPI: Ashley Floyd is a 49 y.o. female with medical history significant for HTN, obesity, anxiety and arthritis who presents with a complaint of two episodes of a large amount of bright red blood per rectum including clots, that started following routine colonoscopy with removal of several polyps by Dr. Allen Norris on 10/27/2020.  She also complained of fever of 100.3 with chills.  She denied abdominal pain, cough, chest pain or shortness of breath or nausea and vomiting.  Complains only of generalized weakness.  Denies dizziness, chest pain or palpitations.  She did endorse dysuria.  She spoke with Dr. Allen Norris who recommended coming to the ED for evaluation. ED Course: On arrival, she was mildly tachycardic at 109 but with BP 135/100, afebrile at 98.6 O2 sat 96% on room air.  On her blood work hemoglobin 13.9, WBC 8600.  Chemistries unremarkable.  Lactic acid 1.5.  Urinalysis pending Chest x-ray clear. Patient was typed and crossed.  Hospitalist consulted for admission.  Review of Systems: As per HPI otherwise all other systems on review of systems negative.    Past Medical History:  Diagnosis Date  . Anxiety    a. per epic care everywhere & patient  . Arthritis    Toes, knees  . Bipolar affective disorder ([place])   . Complication of anesthesia    PT STATES SHE HAS A SMALL MOUTH AND HAS HAD DENTAL WORK ON HER 2 FRONT TEETH AND SHE WANTS EXTRA CARE TAKEN FOR HER TEETH  . Depression   . Dysmenorrhea   . Endometriosis   . Family history of adverse reaction to anesthesia    PTS MOM HAS TROUBLE WITH ANESTHESIA DUE TO HER SMOKING HISTORY  . GERD (gastroesophageal reflux disease)   . Headache    sinus/stress/BP  . Hemorrhoid   . Herpes  genitalis   . History of mammogram 03/22/2015   BI-RADS 1 AT [facility]  . History of nuclear stress test    a. 08/2015: no EKG changes concerning for ischemia, normal wall motion, EF >60%, low risk study  . History of Papanicolaou smear of cervix 03/08/2015   -/-  . Hypertension   . IBS (irritable bowel syndrome)   . Infertility, female    [facility] IN THE PAST  . Obesity   . Ovarian cyst 1998   right  . PMDD (premenstrual dysphoric disorder)   . PONV (postoperative nausea and vomiting)   . Shortness of breath dyspnea   . Sinus infection   . Sleep apnea    "MILD"-NO CPAP-LAST SLEEP STUDY YEARS AGO  . Vertigo    rare    Past Surgical History:  Procedure Laterality Date  . CHOLECYSTECTOMY N/A 12/07/2015   Procedure: LAPAROSCOPIC CHOLECYSTECTOMY ;  Surgeon: Robert Bellow, MD;  Location: [facility] ORS;  Service: General;  Laterality: N/A;  . DIAGNOSTIC LAPAROSCOPY  1988; 2006   OSIS; OVARIAN CYST (2006 AT [facility])  . OVARIAN CYST REMOVAL    . UPPER GI ENDOSCOPY  2015   Dr Allen Norris  . URETHRAL DILATION       reports that she has never smoked. She has never used smokeless tobacco. She reports previous alcohol use. She reports that she does not use drugs.  Allergies  Allergen  Reactions  . Cephalosporins Rash and Itching  . Progesterone     Visual problems  . Adhesive [Tape] Rash    "VERY SENSITIVE SKIN"  Causes scarring   . Cefdinir Itching    Legs itch  . Penicillins Rash and Other (See Comments)    Yeast    Family History  Problem Relation Age of Onset  . Arrhythmia Mother   . Cancer Mother        MELANOMA OF SKIN  . Broken bones Mother        neck  . Hypertension Maternal Grandmother   . Hypertension Maternal Grandfather   . Heart disease Paternal Grandmother   . Heart attack Paternal Grandmother   . Diabetes Paternal Grandmother   . Diabetes Father   . Cancer Paternal Grandfather        MAL NEO OF SKIN; LUNGS  . Heart failure Maternal Aunt   . Heart disease Maternal  Aunt        CABG  . Diabetes Paternal Aunt   . Diabetes Other       Prior to Admission medications   Medication Sig Start Date End Date Taking? Authorizing Provider  ALREX 0.2 % SUSP USE 1 DROP(S) IN BOTH EYES 4 TIMES A DAY AS NEEDED [PRN] 11/28/18   [provider]  carbamazepine (TEGRETOL) 200 MG tablet Take 800 mg by mouth at bedtime.    [provider]  clobetasol ointment (TEMOVATE) 0.05 % Apply to affected area BID prn sx 05/30/22   Copland, Alicia B, PA-C  clonazePAM (KLONOPIN) 1 MG tablet Take 3 mg by mouth at bedtime.  01/30/15   [provider]  DEXILANT 60 MG capsule TAKE 1 CAPSULE BY MOUTH  DAILY 12/28/19   [name], MD  diphenhydrAMINE (BENADRYL) 25 MG tablet Take 25 mg by mouth at bedtime as needed. Reported on 05/23/2016    [provider]  Doxepin HCl 6 MG TABS Take 1 tablet by mouth at bedtime. Patient not taking: Reported on 10/18/2020 05/02/20   [provider]  hydrALAZINE (APRESOLINE) 25 MG tablet Take 1 tablet (25 mg total) by mouth 2 (two) times daily as needed (high pressure, headache). 05/30/20   Minna Merritts, MD  losartan-hydrochlorothiazide (HYZAAR) 50-12.5 MG tablet Take 1 tablet by mouth daily. Patient not taking: Reported on 10/18/2020 05/30/20   Minna Merritts, MD  metoprolol tartrate (LOPRESSOR) 25 MG tablet Take 1 tablet (25 mg total) by mouth 2 (two) times daily as needed (tachycardia). 05/30/20   Minna Merritts, MD  naproxen sodium (ANAPROX) 550 MG tablet Take 1 tablet (550 mg total) by mouth 2 (two) times daily with a meal. Prn sx 05/31/27   Copland, Alicia B, PA-C  progesterone (PROMETRIUM) 100 MG capsule Take 1 cap nightly, 6 nights on, 1 night off Patient not taking: Reported on 10/18/2020 02/07/16   Copland, Deirdre Evener, PA-C  Sodium Sulfate-Mag Sulfate-KCl (SUTAB) [id number] MG TABS Take 376 mg by mouth as directed. 10/18/20   [name], MD  terconazole (TERAZOL 7) 0.4 % vaginal cream AAA BID prn sx 2/69/48    Copland, Alicia B, PA-C  triamcinolone (NASACORT) 55 MCG/ACT AERO nasal inhaler Place 2 sprays into the nose daily. Reported on 05/23/2016    [provider]  valACYclovir (VALTREX) 500 MG tablet TAKE 1 TABLET BY MOUTH 2  TIMES DAILY FOR 3 DAYS AS  NEEDED FOR SYMPTOMS 5/46/27   Copland, Alicia B, PA-C  zaleplon (SONATA) 10 MG capsule Take 10 mg  by mouth daily.     [provider]    Physical Exam: Vitals:   10/27/20 2324  BP: (!) 135/100  Pulse: (!) 109  Resp: 20  Temp: 98.6 F (37 C)  TempSrc: Oral  SpO2: 96%  Weight: 108 kg  Height: 5\' 4"  (1.626 m)     Vitals:   10/27/20 2324  BP: (!) 135/100  Pulse: (!) 109  Resp: 20  Temp: 98.6 F (37 C)  TempSrc: Oral  SpO2: 96%  Weight: 108 kg  Height: 5\' 4"  (1.626 m)      Constitutional: Alert and oriented x 3 . Not in any apparent distress HEENT:      Head: Normocephalic and atraumatic.         Eyes: PERLA, EOMI, Conjunctivae are normal. Sclera is non-icteric.       Mouth/Throat: Mucous membranes are moist.       Neck: Supple with no signs of meningismus. Cardiovascular: Regular rate and rhythm. No murmurs, gallops, or rubs. 2+ symmetrical distal pulses are present . No JVD. No LE edema Respiratory: Respiratory effort normal .Lungs sounds clear bilaterally. No wheezes, crackles, or rhonchi.  Gastrointestinal: Soft, non tender, and non distended with positive bowel sounds. No rebound or guarding. Genitourinary: No CVA tenderness. Musculoskeletal: Nontender with normal range of motion in all extremities. No cyanosis, or erythema of extremities. Neurologic:  Face is symmetric. Moving all extremities. No gross focal neurologic deficits . Skin: Skin is warm, dry.  No rash or ulcers Psychiatric: Mood and affect are normal    Labs on Admission: I have personally reviewed following labs and imaging studies  CBC: Recent Labs  Lab 10/27/20 2330  WBC 8.6  NEUTROABS 4.8  HGB 13.9  HCT 38.8  MCV 88.8  PLT 831    Basic Metabolic Panel: Recent Labs  Lab 10/27/20 2330  NA 140  K 3.6  CL 99  CO2 27  GLUCOSE 110*  BUN 9  CREATININE 0.68  CALCIUM 8.4*   GFR: Estimated Creatinine Clearance: 99.8 mL/min (by C-G formula based on SCr of 0.68 mg/dL). Liver Function Tests: Recent Labs  Lab 10/27/20 2330  AST 42*  ALT 25  ALKPHOS 80  BILITOT 1.0  PROT 6.7  ALBUMIN 3.5   No results for input(s): LIPASE, AMYLASE in the last 168 hours. No results for input(s): AMMONIA in the last 168 hours. Coagulation Profile: Recent Labs  Lab 10/28/20 0001  INR 1.1   Cardiac Enzymes: No results for input(s): CKTOTAL, CKMB, CKMBINDEX, TROPONINI in the last 168 hours. BNP (last 3 results) No results for input(s): PROBNP in the last 8760 hours. HbA1C: No results for input(s): HGBA1C in the last 72 hours. CBG: No results for input(s): GLUCAP in the last 168 hours. Lipid Profile: No results for input(s): CHOL, HDL, LDLCALC, TRIG, CHOLHDL, LDLDIRECT in the last 72 hours. Thyroid Function Tests: No results for input(s): TSH, T4TOTAL, FREET4, T3FREE, THYROIDAB in the last 72 hours. Anemia Panel: No results for input(s): VITAMINB12, FOLATE, FERRITIN, TIBC, IRON, RETICCTPCT in the last 72 hours. Urine analysis:    Component Value Date/Time   BILIRUBINUR neg 12/13/2019 1456   PROTEINUR Negative 12/13/2019 1456   UROBILINOGEN negative (A) 12/13/2019 1456   NITRITE neg 12/13/2019 1456   LEUKOCYTESUR Negative 12/13/2019 1456    Radiological Exams on Admission: DG Chest 2 View  Result Date: 10/28/2020 CLINICAL DATA:  Fever EXAM: CHEST - 2 VIEW COMPARISON:  None. FINDINGS: The heart size and mediastinal contours are within normal limits.  Both lungs are clear. The visualized skeletal structures are unremarkable. IMPRESSION: No active cardiopulmonary disease. Electronically Signed   By: Prudencio Pair M.D.   On: 10/28/2020 00:14     Assessment/Plan 49 year old female with history of HTN, obesity, anxiety  and arthritis who presents with rectal bleeding following routine colonoscopy with removal of several polyps by Dr. Allen Norris on 10/27/2020.  Also has associated fever and dysuria    Hematochezia s/p routine colonoscopy with polypectomy 10/27/20 -Currently hemodynamically stable with hemoglobin of 13.9 -IV fluids -Serial H&H and transfuse if greater than two-point drop in hemoglobin in 2 hours or hemoglobin under 7 -N.p.o. -GI consult  Fever, possible SIRS -Possibly related to procedure but patient endorses dysuria -Follow urinalysis -EmpiricFlagyl to cover intra-abdominal infection. Patient does not want penicillin products. Prefers oral.    Obesity, Class III, BMI 40-49.9 (morbid obesity) ([place]) -Complicating factor to overall prognosis and care    Essential hypertension -Blood pressure stable -Continue home meds    DVT prophylaxis: SCDs code Status: full code  Family Communication:  none  Disposition Plan: Back to previous home environment Consults called: GI Status:At the time of admission, it appears that the appropriate admission status for this patient is INPATIENT. This is judged to be reasonable and necessary in order to provide the required intensity of service to ensure the patient's safety given the presenting symptoms, physical exam findings, and initial radiographic and laboratory data in the context of their  Comorbid conditions.   Patient requires inpatient status due to high intensity of service, high risk for further deterioration and high frequency of surveillance required.   I certify that at the point of admission it is my clinical judgment that the patient will require inpatient hospital care spanning beyond [place] MD [facility]     10/28/2020, 12:52 AM

## 2020-10-28 NOTE — ED Notes (Signed)
Pt endorsing reflux, and wanting to take her psych meds.

## 2020-10-28 NOTE — ED Notes (Signed)
Pt becomes a little nauseous following antibiotic. Given zofran.

## 2020-10-28 NOTE — ED Provider Notes (Addendum)
[facility] Emergency Department Provider Note   ____________________________________________   First MD Initiated Contact with Patient 10/28/20 0032     (approximate)  I have reviewed the triage vital signs and the nursing notes.   HISTORY  Chief Complaint Rectal Bleeding    HPI Ashley Floyd is a 49 y.o. female who presents to the ED from home with a chief complaint of rectal bleeding.  Patient had colonoscopy with several polyps removed by Dr. Allen Norris yesterday morning.  Had several bloody bowel movements, fever of 101 F at home with chills.  She spoke with him via telephone and was directed to the ED for evaluation.  Denies cough, chest pain, shortness of breath, abdominal pain, nausea, vomiting.  Had one episode of dysuria after the procedure.  Overall feeling weak with generalized malaise.  Denies dizziness.  Denies anticoagulant use.     Past Medical History:  Diagnosis Date  . Anxiety    a. per epic care everywhere & patient  . Arthritis    Toes, knees  . Bipolar affective disorder ([place])   . Complication of anesthesia    PT STATES SHE HAS A SMALL MOUTH AND HAS HAD DENTAL WORK ON HER 2 FRONT TEETH AND SHE WANTS EXTRA CARE TAKEN FOR HER TEETH  . Depression   . Dysmenorrhea   . Endometriosis   . Family history of adverse reaction to anesthesia    PTS MOM HAS TROUBLE WITH ANESTHESIA DUE TO HER SMOKING HISTORY  . GERD (gastroesophageal reflux disease)   . Headache    sinus/stress/BP  . Hemorrhoid   . Herpes genitalis   . History of mammogram 03/22/2015   BI-RADS 1 AT [facility]  . History of nuclear stress test    a. 08/2015: no EKG changes concerning for ischemia, normal wall motion, EF >60%, low risk study  . History of Papanicolaou smear of cervix 03/08/2015   -/-  . Hypertension   . IBS (irritable bowel syndrome)   . Infertility, female    [facility] PT IN THE PAST  . Obesity   . Ovarian cyst 1998   right  . PMDD (premenstrual dysphoric  disorder)   . PONV (postoperative nausea and vomiting)   . Shortness of breath dyspnea   . Sinus infection   . Sleep apnea    "MILD"-NO CPAP-LAST SLEEP STUDY YEARS AGO  . Vertigo    rare    Patient Active Problem List   Diagnosis Date Noted  . Hematochezia 10/28/2020  . H/O colonoscopy with polypectomy 10/26/20 10/28/2020  . Colon cancer screening   . Polyp of ascending colon   . Perimenopausal vasomotor symptoms 06/08/2020  . Strain of lumbar region 08/17/2019  . Perimenopause 05/03/2019  . Allergic rhinitis 05/15/2017  . Endometriosis 05/15/2017  . Acid reflux 05/15/2017  . Brash 05/15/2017  . Adaptive colitis 05/15/2017  . Cannot sleep 05/15/2017  . Lichen sclerosus 47/82/9562  . Genital herpes 03/24/2017  . Preoperative cardiovascular examination 10/26/2015  . Gallstones 10/23/2015  . Opacity of lung on imaging study 10/16/2015  . Epigastric pain 10/04/2015  . Atypical chest pain 09/22/2015  . Anxiety   . IBS (irritable bowel syndrome) 07/26/2015  . Obesity, Class II, BMI 35-39.9 07/26/2015  . Pain in the chest 03/22/2015  . SOB (shortness of breath) on exertion 03/22/2015  . Bilateral leg edema 03/22/2015  . Obesity, Class III, BMI 40-49.9 (morbid obesity) ([place]) 03/22/2015  . Clinical depression 03/22/2015  . Affective bipolar disorder ([place]) 03/22/2015  .  Essential hypertension 03/22/2015    Past Surgical History:  Procedure Laterality Date  . CHOLECYSTECTOMY N/A 12/07/2015   Procedure: LAPAROSCOPIC CHOLECYSTECTOMY ;  Surgeon: Robert Bellow, MD;  Location: [facility] ORS;  Service: General;  Laterality: N/A;  . DIAGNOSTIC LAPAROSCOPY  1988; 2006   OSIS; OVARIAN CYST (2006 AT [facility])  . OVARIAN CYST REMOVAL    . UPPER GI ENDOSCOPY  2015   Dr Allen Norris  . URETHRAL DILATION      Prior to Admission medications   Medication Sig Start Date End Date Taking? Authorizing Provider  ALREX 0.2 % SUSP USE 1 DROP(S) IN BOTH EYES 4 TIMES A DAY AS NEEDED [PRN] 11/28/18  Yes  [provider]  carbamazepine (TEGRETOL) 200 MG tablet Take 800 mg by mouth at bedtime.   Yes [provider]  clobetasol ointment (TEMOVATE) 0.05 % Apply to affected area BID prn sx 1/66/06  Yes Copland, Alicia B, PA-C  clonazePAM (KLONOPIN) 1 MG tablet Take 3 mg by mouth at bedtime.  01/30/15  Yes [provider]  DEXILANT 60 MG capsule TAKE 1 CAPSULE BY MOUTH  DAILY Patient taking differently: Take 60 mg by mouth at bedtime.  12/28/19  Yes [name], MD  diphenhydrAMINE (BENADRYL) 25 MG tablet Take 25 mg by mouth at bedtime as needed. Reported on 05/23/2016   Yes [provider]  hydrALAZINE (APRESOLINE) 25 MG tablet Take 1 tablet (25 mg total) by mouth 2 (two) times daily as needed (high pressure, headache). 05/30/20  Yes Gollan, Kathlene November, MD  losartan-hydrochlorothiazide (HYZAAR) 50-12.5 MG tablet Take 1 tablet by mouth daily. 05/30/20  Yes Minna Merritts, MD  metoprolol tartrate (LOPRESSOR) 25 MG tablet Take 1 tablet (25 mg total) by mouth 2 (two) times daily as needed (tachycardia). 05/30/20  Yes Minna Merritts, MD  naproxen sodium (ANAPROX) 550 MG tablet Take 1 tablet (550 mg total) by mouth 2 (two) times daily with a meal. Prn sx 3/0/16  Yes Copland, Alicia B, PA-C  olopatadine (PATANOL) 0.1 % ophthalmic solution Place 1 drop into both eyes 4 (four) times daily as needed.  10/02/20  Yes [provider]  terconazole (TERAZOL 7) 0.4 % vaginal cream AAA BID prn sx 0/10/93  Yes Copland, Alicia B, PA-C  triamcinolone (NASACORT) 55 MCG/ACT AERO nasal inhaler Place 2 sprays into the nose daily. Reported on 05/23/2016   Yes [provider]  valACYclovir (VALTREX) 500 MG tablet TAKE 1 TABLET BY MOUTH 2  TIMES DAILY FOR 3 DAYS AS  NEEDED FOR SYMPTOMS 2/35/57  Yes Copland, Alicia B, PA-C  zaleplon (SONATA) 10 MG capsule Take 10 mg by mouth daily.    Yes [provider]  Doxepin HCl 6 MG TABS Take 1 tablet by mouth at bedtime. Patient not  taking: Reported on 10/18/2020 05/02/20   [provider]  progesterone (PROMETRIUM) 100 MG capsule Take 1 cap nightly, 6 nights on, 1 night off Patient not taking: Reported on 10/18/2020 02/13/01   Copland, Deirdre Evener, PA-C  Sodium Sulfate-Mag Sulfate-KCl (SUTAB) [phone] MG TABS Take 376 mg by mouth as directed. Patient not taking: Reported on 10/28/2020 10/18/20   [name], MD    Allergies Cephalosporins, Progesterone, Adhesive [tape], Cefdinir, and Penicillins  Family History  Problem Relation Age of Onset  . Arrhythmia Mother   . Cancer Mother        MELANOMA OF SKIN  . Broken bones Mother        neck  . Hypertension Maternal Grandmother   .  Hypertension Maternal Grandfather   . Heart disease Paternal Grandmother   . Heart attack Paternal Grandmother   . Diabetes Paternal Grandmother   . Diabetes Father   . Cancer Paternal Grandfather        MAL NEO OF SKIN; LUNGS  . Heart failure Maternal Aunt   . Heart disease Maternal Aunt        CABG  . Diabetes Paternal Aunt   . Diabetes Other     Social History Social History   Tobacco Use  . Smoking status: Never Smoker  . Smokeless tobacco: Never Used  Vaping Use  . Vaping Use: Never used  Substance Use Topics  . Alcohol use: Not Currently    Alcohol/week: 0.0 standard drinks    Comment: rarely  . Drug use: No    Review of Systems  Constitutional: Positive for fever/chills and generalized malaise. Eyes: No visual changes. ENT: No sore throat. Cardiovascular: Denies chest pain. Respiratory: Denies shortness of breath. Gastrointestinal: Positive for rectal bleeding.  No abdominal pain.  No nausea, no vomiting.  No diarrhea.  No constipation. Genitourinary: Negative for dysuria. Musculoskeletal: Negative for back pain. Skin: Negative for rash. Neurological: Negative for headaches, focal weakness or numbness.   ____________________________________________   PHYSICAL EXAM:  VITAL SIGNS: ED Triage  Vitals [10/27/20 2324]  Enc Vitals Group     BP (!) 135/100     Pulse Rate (!) 109     Resp 20     Temp 98.6 F (37 C)     Temp Source Oral     SpO2 96 %     Weight 238 lb (108 kg)     Height 5\' 4"  (1.626 m)     Head Circumference      Peak Flow      Pain Score 0     Pain Loc      Pain Edu?      Excl. in [place]?     Constitutional: Alert and oriented.  Tired appearing and in no acute distress. Eyes: Conjunctivae are normal. PERRL. EOMI. Head: Atraumatic. Nose: No congestion/rhinnorhea. Mouth/Throat: Mucous membranes are mildly dry.   Neck: No stridor.   Cardiovascular: Tachycardic rate, regular rhythm. Grossly normal heart sounds.  Good peripheral circulation. Respiratory: Normal respiratory effort.  No retractions. Lungs CTAB. Gastrointestinal: Soft and nontender to light or deep palpation. No distention. No abdominal bruits. No CVA tenderness. Rectal: Gross blood noted. Musculoskeletal: No lower extremity tenderness nor edema.  No joint effusions. Neurologic:  Normal speech and language. No gross focal neurologic deficits are appreciated. No gait instability. Skin:  Skin is warm, dry and intact. No rash noted. Psychiatric: Mood and affect are normal. Speech and behavior are normal.  ____________________________________________   LABS (all labs ordered are listed, but only abnormal results are displayed)  Labs Reviewed  COMPREHENSIVE METABOLIC PANEL - Abnormal; Notable for the following components:      Result Value   Glucose, Bld 110 (*)    Calcium 8.4 (*)    AST 42 (*)    All other components within normal limits  URINALYSIS, COMPLETE (UACMP) WITH MICROSCOPIC - Abnormal; Notable for the following components:   Color, Urine YELLOW (*)    APPearance HAZY (*)    Bacteria, UA RARE (*)    All other components within normal limits  CULTURE, BLOOD (ROUTINE X 2)  CULTURE, BLOOD (ROUTINE X 2)  URINE CULTURE  LACTIC ACID, PLASMA  LACTIC ACID, PLASMA  CBC WITH  DIFFERENTIAL/PLATELET  PROTIME-INR  PROCALCITONIN  HIV ANTIBODY (ROUTINE TESTING W REFLEX)  CBC  BASIC METABOLIC PANEL  TYPE AND SCREEN   ____________________________________________  EKG  ED ECG REPORT I, Aldric Wenzler J, the attending physician, personally viewed and interpreted this ECG.   Date: 10/28/2020  EKG Time: 0057  Rate: 85  Rhythm: normal EKG, normal sinus rhythm  Axis: Normal  Intervals:none  ST&T Change: Nonspecific  ____________________________________________  RADIOLOGY I, Addalyne Vandehei J, personally viewed and evaluated these images (plain radiographs) as part of my medical decision making, as well as reviewing the written report by the radiologist.  ED MD interpretation: No acute cardiopulmonary process  Official radiology report(s): DG Chest 2 View  Result Date: 10/28/2020 CLINICAL DATA:  Fever EXAM: CHEST - 2 VIEW COMPARISON:  None. FINDINGS: The heart size and mediastinal contours are within normal limits. Both lungs are clear. The visualized skeletal structures are unremarkable. IMPRESSION: No active cardiopulmonary disease. Electronically Signed   By: Prudencio Pair M.D.   On: 10/28/2020 00:14    ____________________________________________   PROCEDURES  Procedure(s) performed (including Critical Care):  .1-3 Lead EKG Interpretation Performed by: Paulette Blanch, MD Authorized by: Paulette Blanch, MD     Interpretation: abnormal     ECG rate:  105   ECG rate assessment: tachycardic     Rhythm: sinus tachycardia     Ectopy: none     Conduction: normal   Comments:     Patient placed on cardiac monitor to evaluate for arrhythmias     ____________________________________________   INITIAL IMPRESSION / ASSESSMENT AND PLAN / ED COURSE  As part of my medical decision making, I reviewed the following data within the [place] notes reviewed and incorporated, Labs reviewed, EKG interpreted, Old chart reviewed (colonoscopy  10/27/2020, negative Covid test 10/25/2020), Radiograph reviewed, Discussed with admitting physician and Notes from prior ED visits     49 year old female presenting with post polypectomy bleeding after screening colonoscopy with generalized weakness, fever and chills.  Differential diagnosis includes but is not limited to postprocedural bleeding, bowel perforation, CAP, UTI, etc.  H/H stable.  Will obtain type and screen.  Lactic acid 1.5.  Hemodynamically stable.  Will administer IV fluids, keep n.p.o. and discuss with hospitalist service for admission  Clinical Course as of Oct 28 708  Sat Oct 28, 2020  0708 Patient very upset at missing her home medications.  Pharmacy tech has reconciled patient's med list; they are entered in the computer   [JS]    Clinical Course User Index [JS] Paulette Blanch, MD     ____________________________________________   FINAL CLINICAL IMPRESSION(S) / ED DIAGNOSES  Final diagnoses:  Rectal bleeding  Status post colonoscopy with polypectomy     ED Discharge Orders    None      *Please note:  Ashley Floyd was evaluated in Emergency Department on 10/28/2020 for the symptoms described in the history of present illness. She was evaluated in the context of the global COVID-19 pandemic, which necessitated consideration that the patient might be at risk for infection with the SARS-CoV-2 virus that causes COVID-19. Institutional protocols and algorithms that pertain to the evaluation of patients at risk for COVID-19 are in a state of rapid change based on information released by regulatory bodies including the CDC and federal and state organizations. These policies and algorithms were followed during the patient's care in the ED.  Some ED evaluations and interventions may be delayed as a result of limited staffing during and  the pandemic.*   Note:  This document was prepared using Dragon voice recognition software and may include unintentional dictation  errors.   Paulette Blanch, MD 10/28/20 1165    Paulette Blanch, MD 10/28/20 [phone]

## 2020-10-28 NOTE — ED Notes (Signed)
Hospitalist at bedside 

## 2020-10-28 NOTE — ED Notes (Signed)
Dr. Wohl at bedside. ?

## 2020-10-28 NOTE — ED Notes (Signed)
Introduced self to pt. Discussed possible moving the IV. Pt wants it to stay right now.

## 2020-10-29 LAB — URINE CULTURE: Culture: NO GROWTH

## 2020-10-30 ENCOUNTER — Encounter: Payer: Self-pay | Admitting: Gastroenterology

## 2020-10-31 LAB — SURGICAL PATHOLOGY

## 2020-11-01 ENCOUNTER — Encounter: Payer: Self-pay | Admitting: Gastroenterology

## 2020-11-02 LAB — CULTURE, BLOOD (ROUTINE X 2)
Culture: NO GROWTH
Culture: NO GROWTH
Special Requests: ADEQUATE

## 2020-12-26 ENCOUNTER — Other Ambulatory Visit: Payer: Self-pay

## 2020-12-26 ENCOUNTER — Encounter: Payer: Self-pay | Admitting: Obstetrics and Gynecology

## 2020-12-26 ENCOUNTER — Ambulatory Visit (INDEPENDENT_AMBULATORY_CARE_PROVIDER_SITE_OTHER): Payer: 59 | Admitting: Obstetrics and Gynecology

## 2020-12-26 VITALS — BP 120/70 | Ht 64.0 in | Wt 250.0 lb

## 2020-12-26 DIAGNOSIS — B354 Tinea corporis: Secondary | ICD-10-CM | POA: Diagnosis not present

## 2020-12-26 DIAGNOSIS — N898 Other specified noninflammatory disorders of vagina: Secondary | ICD-10-CM | POA: Diagnosis not present

## 2020-12-26 DIAGNOSIS — L900 Lichen sclerosus et atrophicus: Secondary | ICD-10-CM | POA: Diagnosis not present

## 2020-12-26 DIAGNOSIS — R350 Frequency of micturition: Secondary | ICD-10-CM

## 2020-12-26 LAB — POCT WET PREP WITH KOH
Clue Cells Wet Prep HPF POC: NEGATIVE
KOH Prep POC: NEGATIVE
Trichomonas, UA: NEGATIVE
Yeast Wet Prep HPF POC: NEGATIVE

## 2020-12-26 LAB — POCT URINALYSIS DIPSTICK
Bilirubin, UA: NEGATIVE
Blood, UA: NEGATIVE
Glucose, UA: NEGATIVE
Ketones, UA: NEGATIVE
Leukocytes, UA: NEGATIVE
Nitrite, UA: NEGATIVE
Protein, UA: NEGATIVE
Spec Grav, UA: 1.02 (ref 1.010–1.025)
pH, UA: 5 (ref 5.0–8.0)

## 2020-12-26 MED ORDER — DESONIDE 0.05 % EX GEL: Freq: Two times a day (BID) | CUTANEOUS | 0 refills | Status: DC

## 2020-12-26 MED ORDER — FLUCONAZOLE 150 MG PO TABS: 150.0000 mg | ORAL_TABLET | Freq: Once | ORAL | 0 refills | Status: AC

## 2020-12-26 NOTE — Progress Notes (Signed)
Patient, No Pcp Per    Chief Complaint  Patient presents with  . Vaginal Discharge    Sour odor and itchiness/irritation 2-3 weeks  . Urinary Tract Infection    Lower back pain and sharp pain in bladder, some frequency  . [employer] Employee    HPI:      [name] is a 50 y.o. G1P0010 whose LMP was No LMP recorded. Patient is perimenopausal., presents today for increased vag d/c, white, clear and sometimes brown, for past 2-3 wks, with sour odor and irritation. Has had increased itching around clitoris and labia. Hx of LS and has been treating externally with terazol crm, not clobetasol. Was on abx for UTI after ED visit 12/21.  Also with urinary frequency with good flow. 1 episode LBP with bladder pain. Drinks lots of caffeine. Treated for UTI with levaquin 12/21 at ED but C&S neg.   Her menses are infrequent now, lasting 6-7days, no BTB. Had several days brown d/c off and on past 2-3 wks. S/p colonoscopy with polyp removal with subsequent large amt rectal bleeding and fever 12/21. Seen in ED.    Needs RF on desonate 0.05% gel for tinea corporis under breasts. OTC antifungal powders don't work. Derm gave pt desonate and it works well. Only uses prn.   Past Medical History:  Diagnosis Date  . Anxiety    a. per epic care everywhere & patient  . Arthritis    Toes, knees  . Bipolar affective disorder ([place])   . Complication of anesthesia    PT STATES SHE HAS A SMALL MOUTH AND HAS HAD DENTAL WORK ON HER 2 FRONT TEETH AND SHE WANTS EXTRA CARE TAKEN FOR HER TEETH  . Depression   . Dysmenorrhea   . Endometriosis   . Family history of adverse reaction to anesthesia    PTS MOM HAS TROUBLE WITH ANESTHESIA DUE TO HER SMOKING HISTORY  . GERD (gastroesophageal reflux disease)   . Headache    sinus/stress/BP  . Hemorrhoid   . Herpes genitalis   . History of mammogram 03/22/2015   BI-RADS 1 AT [facility]  . History of nuclear stress test    a. 08/2015: no EKG changes concerning for  ischemia, normal wall motion, EF >60%, low risk study  . History of Papanicolaou smear of cervix 03/08/2015   -/-  . Hypertension   . IBS (irritable bowel syndrome)   . Infertility, female    [facility] PT IN THE PAST  . Lichen sclerosus   . Obesity   . Ovarian cyst 1998   right  . PMDD (premenstrual dysphoric disorder)   . PONV (postoperative nausea and vomiting)   . Shortness of breath dyspnea   . Sinus infection   . Sleep apnea    "MILD"-NO CPAP-LAST SLEEP STUDY YEARS AGO  . Vertigo    rare    Past Surgical History:  Procedure Laterality Date  . CHOLECYSTECTOMY N/A 12/07/2015   Procedure: LAPAROSCOPIC CHOLECYSTECTOMY ;  Surgeon: Robert Bellow, MD;  Location: [facility] ORS;  Service: General;  Laterality: N/A;  . COLONOSCOPY WITH PROPOFOL N/A 10/27/2020   Procedure: COLONOSCOPY WITH BIOPSY;  Surgeon: [name], MD;  Location: [place];  Service: Endoscopy;  Laterality: N/A;  sleep apnea  . DIAGNOSTIC LAPAROSCOPY  1988; 2006   OSIS; OVARIAN CYST (2006 AT [facility])  . OVARIAN CYST REMOVAL    . POLYPECTOMY N/A 10/27/2020   Procedure: POLYPECTOMY;  Surgeon: [name], MD;  Location: [place]  [facility];  Service: Endoscopy;  Laterality: N/A;  Clip x 2 placed at [place] Polyp Removal  . UPPER GI ENDOSCOPY  2015   Dr Allen Norris  . URETHRAL DILATION      Family History  Problem Relation Age of Onset  . Arrhythmia Mother   . Cancer Mother        MELANOMA OF SKIN  . Broken bones Mother        neck  . Hypertension Maternal Grandmother   . Hypertension Maternal Grandfather   . Heart disease Paternal Grandmother   . Heart attack Paternal Grandmother   . Diabetes Paternal Grandmother   . Diabetes Father   . Cancer Paternal Grandfather        MAL NEO OF SKIN; LUNGS  . Heart failure Maternal Aunt   . Heart disease Maternal Aunt        CABG  . Diabetes Paternal Aunt   . Diabetes Other     Social History   Socioeconomic History  . Marital status: Married    Spouse name: Not  on file  . Number of children: 0  . Years of education: 47  . Highest education level: Not on file  Occupational History  . Not on file  Tobacco Use  . Smoking status: Never Smoker  . Smokeless tobacco: Never Used  Vaping Use  . Vaping Use: Never used  Substance and Sexual Activity  . Alcohol use: Not Currently    Alcohol/week: 0.0 standard drinks    Comment: rarely  . Drug use: No  . Sexual activity: Not Currently    Birth control/protection: Post-menopausal  Other Topics Concern  . Not on file  Social History Narrative  . Not on file   Social Determinants of Health   Financial Resource Strain: Not on file  Food Insecurity: Not on file  Transportation Needs: Not on file  Physical Activity: Not on file  Stress: Not on file  Social Connections: Not on file  Intimate Partner Violence: Not on file    Outpatient Medications Prior to Visit  Medication Sig Dispense Refill  . ALREX 0.2 % SUSP USE 1 DROP(S) IN BOTH EYES 4 TIMES A DAY AS NEEDED [PRN]    . carbamazepine (TEGRETOL) 200 MG tablet Take 800 mg by mouth at bedtime.    . clobetasol ointment (TEMOVATE) 0.05 % Apply to affected area BID prn sx 30 g 0  . clonazePAM (KLONOPIN) 1 MG tablet Take 3 mg by mouth at bedtime.     . cyclobenzaprine (FLEXERIL) 10 MG tablet Take 10 mg by mouth 2 (two) times daily as needed.    Marland Kitchen dexlansoprazole (DEXILANT) 60 MG capsule Take 1 capsule (60 mg total) by mouth at bedtime. 30 capsule 0  . diphenhydrAMINE (BENADRYL) 25 MG tablet Take 25 mg by mouth at bedtime as needed. Reported on 05/23/2016    . hydrALAZINE (APRESOLINE) 25 MG tablet Take 1 tablet (25 mg total) by mouth 2 (two) times daily as needed (high pressure, headache). 180 tablet 3  . losartan-hydrochlorothiazide (HYZAAR) 50-12.5 MG tablet Take 1 tablet by mouth daily. 90 tablet 3  . metoprolol tartrate (LOPRESSOR) 25 MG tablet Take 1 tablet (25 mg total) by mouth 2 (two) times daily as needed (tachycardia). 180 tablet 3  .  olopatadine (PATANOL) 0.1 % ophthalmic solution Place 1 drop into both eyes 4 (four) times daily as needed.     Marland Kitchen terconazole (TERAZOL 7) 0.4 % vaginal cream AAA BID prn sx 45 g 0  .  triamcinolone (NASACORT) 55 MCG/ACT AERO nasal inhaler Place 2 sprays into the nose daily. Reported on 05/23/2016    . valACYclovir (VALTREX) 500 MG tablet TAKE 1 TABLET BY MOUTH 2  TIMES DAILY FOR 3 DAYS AS  NEEDED FOR SYMPTOMS 30 tablet 0  . zaleplon (SONATA) 10 MG capsule Take 10 mg by mouth daily.     No facility-administered medications prior to visit.      ROS:  Review of Systems  Constitutional: Negative for fever, malaise/fatigue and weight loss.  Gastrointestinal: Negative for blood in stool, constipation, diarrhea, nausea and vomiting.  Genitourinary: Positive for frequency and vaginal discharge. Negative for dyspareunia, dysuria, flank pain, hematuria, urgency, vaginal bleeding and vaginal pain.  Musculoskeletal: Positive for arthralgias. Negative for back pain.  Skin: Positive for rash. Negative for itching.    OBJECTIVE:   Vitals:  BP 120/70   Ht 5\' 4"  (1.626 m)   Wt 250 lb (113.4 kg)   BMI 42.91 kg/m   Physical Exam Vitals reviewed.  Constitutional:      Appearance: She is well-developed.  Pulmonary:     Effort: Pulmonary effort is normal.  Genitourinary:    General: Normal vulva.     Pubic Area: No rash.      Labia:        Right: Rash and lesion present. No tenderness.        Left: Rash and lesion present. No tenderness.      Vagina: Normal. No vaginal discharge, erythema, tenderness or bleeding.     Cervix: Normal.     Uterus: Normal. Not enlarged and not tender.      Adnexa: Right adnexa normal and left adnexa normal.       Right: No mass or tenderness.         Left: No mass or tenderness.         Comments: NO VAG BLEEDING/SPOTTING Musculoskeletal:        General: Normal range of motion.     Cervical back: Normal range of motion.  Skin:    General: Skin is warm and  dry.  Neurological:     General: No focal deficit present.     Mental Status: She is alert and oriented to person, place, and time.  Psychiatric:        Mood and Affect: Mood normal.        Behavior: Behavior normal.        Thought Content: Thought content normal.        Judgment: Judgment normal.     Results: Results for orders placed or performed in visit on 12/26/20 (from the past 24 hour(s))  POCT Urinalysis Dipstick     Status: Normal   Collection Time: 12/26/20  5:19 PM  Result Value Ref Range   Color, UA yellow    Clarity, UA clear    Glucose, UA Negative Negative   Bilirubin, UA neg    Ketones, UA neg    Spec Grav, UA 1.020 1.010 - 1.025   Blood, UA neg    pH, UA 5.0 5.0 - 8.0   Protein, UA Negative Negative   Urobilinogen, UA     Nitrite, UA neg    Leukocytes, UA Negative Negative   Appearance     Odor    POCT Wet Prep with KOH     Status: Normal   Collection Time: 12/26/20  5:20 PM  Result Value Ref Range   Trichomonas, UA Negative    Clue Cells Wet Prep  HPF POC neg    Epithelial Wet Prep HPF POC     Yeast Wet Prep HPF POC neg    Bacteria Wet Prep HPF POC     RBC Wet Prep HPF POC     WBC Wet Prep HPF POC     KOH Prep POC Negative Negative     Assessment/Plan: Vaginal itching - Plan: fluconazole (DIFLUCAN) 150 MG tablet, POCT Wet Prep with KOH; neg wet prep, pos exam. Rx diflucan due to recent abx use. Sx mostly due to LS, not controlled. D/C terazol and resume clobetasol QHS for 2-4 wks, then wean down and use least amount dose to control itch.   Lichen sclerosus--not controlled currently and causing sx.   Urinary frequency - Plan: POCT Urinalysis Dipstick; neg UA, Check C&S. If neg, d/c caffeine for sx improvement.  Tinea corporis - Plan: desonide (DESONATE) 0.05 % gel; Rx RF per pt request. Keep area dry.    Meds ordered this encounter  Medications  . fluconazole (DIFLUCAN) 150 MG tablet    Sig: Take 1 tablet (150 mg total) by mouth once for 1  dose.    Dispense:  1 tablet    Refill:  0    Order Specific Question:   Supervising Provider    Answer:   Gae Dry [id number]  . desonide (DESONATE) 0.05 % gel    Sig: Apply topically 2 (two) times daily.    Dispense:  60 g    Refill:  0    Order Specific Question:   Supervising Provider    Answer:   Gae Dry [id number]      Return if symptoms worsen or fail to improve.  Jaquasha Carnevale B. Virdell Hoiland, PA-C 12/27/2020 1:58 PM

## 2020-12-27 ENCOUNTER — Encounter: Payer: Self-pay | Admitting: Obstetrics and Gynecology

## 2020-12-27 NOTE — Patient Instructions (Signed)
I value your feedback and you entrusting us with your care. If you get a  patient survey, I would appreciate you taking the time to let us know about your experience today. Thank you! ? ? ?

## 2020-12-29 ENCOUNTER — Other Ambulatory Visit: Payer: Self-pay | Admitting: Obstetrics and Gynecology

## 2020-12-29 ENCOUNTER — Encounter: Payer: Self-pay | Admitting: Obstetrics and Gynecology

## 2020-12-29 MED ORDER — DESONIDE 0.05 % EX CREA: TOPICAL_CREAM | Freq: Two times a day (BID) | CUTANEOUS | 0 refills | Status: DC

## 2021-01-09 ENCOUNTER — Other Ambulatory Visit: Payer: Self-pay | Admitting: Gastroenterology

## 2021-02-26 ENCOUNTER — Encounter: Payer: Self-pay | Admitting: Obstetrics and Gynecology

## 2021-06-13 ENCOUNTER — Encounter: Payer: Self-pay | Admitting: Obstetrics and Gynecology

## 2021-06-13 ENCOUNTER — Ambulatory Visit (INDEPENDENT_AMBULATORY_CARE_PROVIDER_SITE_OTHER): Payer: 59 | Admitting: Obstetrics and Gynecology

## 2021-06-13 ENCOUNTER — Other Ambulatory Visit: Payer: Self-pay

## 2021-06-13 VITALS — BP 130/80 | Ht 64.0 in | Wt 229.0 lb

## 2021-06-13 DIAGNOSIS — Z1231 Encounter for screening mammogram for malignant neoplasm of breast: Secondary | ICD-10-CM | POA: Diagnosis not present

## 2021-06-13 DIAGNOSIS — L900 Lichen sclerosus et atrophicus: Secondary | ICD-10-CM

## 2021-06-13 DIAGNOSIS — B354 Tinea corporis: Secondary | ICD-10-CM

## 2021-06-13 DIAGNOSIS — Z1211 Encounter for screening for malignant neoplasm of colon: Secondary | ICD-10-CM

## 2021-06-13 DIAGNOSIS — Z01419 Encounter for gynecological examination (general) (routine) without abnormal findings: Secondary | ICD-10-CM

## 2021-06-13 DIAGNOSIS — A6004 Herpesviral vulvovaginitis: Secondary | ICD-10-CM

## 2021-06-13 MED ORDER — TERCONAZOLE 0.4 % VA CREA: TOPICAL_CREAM | VAGINAL | 0 refills | Status: DC

## 2021-06-13 MED ORDER — DESONIDE 0.05 % EX CREA: TOPICAL_CREAM | Freq: Two times a day (BID) | CUTANEOUS | 0 refills | Status: DC

## 2021-06-13 MED ORDER — CLOBETASOL PROPIONATE 0.05 % EX OINT: TOPICAL_OINTMENT | CUTANEOUS | 1 refills | Status: DC

## 2021-06-13 NOTE — Progress Notes (Signed)
Chief Complaint  Patient presents with   Gynecologic Exam    Qs on LS     HPI:      [name] is a 50 y.o. G1P0010 who LMP was No LMP recorded. Patient is perimenopausal., presents today for her annual examination. Her menses are absent now since 12/20. No PMB. She has a hx of endometriosis and ovarian cysts. She has vasomotor sx.  Pt with hx of lichen sclerosis sx and saw derm. Treats with clobetasol oint prn sx, usually for a few days at a time, a few times a yr. Had flare 2/22 and pt using 2-3 times wkly now with sx improvement, but hard to see affected areas. Pt needs Rx RF. Also treats with terconazole crm and desonide for tinea under bilat breasts, inguinal areas, panus. Tried to keep dry but doesn't like powders.  Needs Rx RF.   Sex activity: not sex active; hx of infertility. Last Pap: 04/07/18  Results were: no abnormalities /neg HPV DNA  Hx of STDs: HSV, oral and genital and takes valtrex a few times a year prn sx. Doesn't need RF today.   Last mammogram: 06/12/20 at [facility].  Results were: normal--routine follow-up in 12 months There is no FH of breast cancer. There is no FH of ovarian cancer. The patient does not do self-breast exams.   Tobacco use: The patient denies current or previous tobacco use. Alcohol use: none No drug use Exercise: not active   She does not get adequate calcium but is taking Vitamin D supp.  Labs with PCP  She sees cardio, Dr. Rockey Situ, for HTN.  She was seeing GI for IBS-D and wt loss last yr. Had colonoscopy 12/21 with precancerousp polyps; repeat due after 6 months with Dr. Allen Norris. Hasn't had time to do repeat due to having covid 5/22.    Past Medical History:  Diagnosis Date   Anxiety    a. per epic care everywhere & patient   Arthritis    Toes, knees   Bipolar affective disorder ([place])    Complication of anesthesia    PT STATES SHE HAS A SMALL MOUTH AND HAS HAD DENTAL WORK ON HER 2 FRONT TEETH AND SHE WANTS EXTRA CARE TAKEN FOR HER  TEETH   Depression    Dysmenorrhea    Endometriosis    Family history of adverse reaction to anesthesia    PTS MOM HAS TROUBLE WITH ANESTHESIA DUE TO HER SMOKING HISTORY   GERD (gastroesophageal reflux disease)    Headache    sinus/stress/BP   Hemorrhoid    Herpes genitalis    History of mammogram 03/22/2015   BI-RADS 1 AT [facility]   History of nuclear stress test    a. 08/2015: no EKG changes concerning for ischemia, normal wall motion, EF >60%, low risk study   History of Papanicolaou smear of cervix 03/08/2015   -/-   Hypertension    IBS (irritable bowel syndrome)    Infertility, female    [facility] PT IN THE PAST   Lichen sclerosus    Obesity    Ovarian cyst 1998   right   PMDD (premenstrual dysphoric disorder)    PONV (postoperative nausea and vomiting)    Shortness of breath dyspnea    Sinus infection    Sleep apnea    "MILD"-NO CPAP-LAST SLEEP STUDY YEARS AGO   Vertigo    rare    Past Surgical History:  Procedure Laterality Date   CHOLECYSTECTOMY N/A 12/07/2015  Procedure: LAPAROSCOPIC CHOLECYSTECTOMY ;  Surgeon: Robert Bellow, MD;  Location: [facility];  Service: General;  Laterality: N/A;   COLONOSCOPY WITH PROPOFOL N/A 10/27/2020   Procedure: COLONOSCOPY WITH BIOPSY;  Surgeon: [name], MD;  Location: [place];  Service: Endoscopy;  Laterality: N/A;  sleep apnea   DIAGNOSTIC LAPAROSCOPY  1998; 2006   OSIS; OVARIAN CYST (2006 AT [facility])   OVARIAN CYST REMOVAL     POLYPECTOMY N/A 10/27/2020   Procedure: POLYPECTOMY;  Surgeon: [name], MD;  Location: [place];  Service: Endoscopy;  Laterality: N/A;  Clip x 2 placed at [facility] Polyp Removal   UPPER GI ENDOSCOPY  2015   Dr Allen Norris   URETHRAL DILATION      Family History  Problem Relation Age of Onset   Arrhythmia Mother    Cancer Mother        MELANOMA OF SKIN   Broken bones Mother        neck   Hypertension Maternal Grandmother    Hypertension Maternal Grandfather    Heart disease  Paternal Grandmother    Heart attack Paternal Grandmother    Diabetes Paternal Grandmother    Diabetes Father    Cancer Paternal Grandfather        MAL NEO OF SKIN; LUNGS   Heart failure Maternal Aunt    Heart disease Maternal Aunt        CABG   Diabetes Paternal Aunt    Diabetes Other     Social History   Socioeconomic History   Marital status: Married    Spouse name: Not on file   Number of children: 0   Years of education: 16   Highest education level: Not on file  Occupational History   Not on file  Tobacco Use   Smoking status: Never   Smokeless tobacco: Never  Vaping Use   Vaping Use: Never used  Substance and Sexual Activity   Alcohol use: Not Currently    Alcohol/week: 0.0 standard drinks    Comment: rarely   Drug use: No   Sexual activity: Not Currently    Birth control/protection: Post-menopausal  Other Topics Concern   Not on file  Social History Narrative   Not on file   Social Determinants of Health   Financial Resource Strain: Not on file  Food Insecurity: Not on file  Transportation Needs: Not on file  Physical Activity: Not on file  Stress: Not on file  Social Connections: Not on file  Intimate Partner Violence: Not on file    Current Outpatient Medications on File Prior to Visit  Medication Sig Dispense Refill   ALREX 0.2 % SUSP USE 1 DROP(S) IN BOTH EYES 4 TIMES A DAY AS NEEDED [PRN]     carbamazepine (TEGRETOL) 200 MG tablet Take 800 mg by mouth at bedtime.     clonazePAM (KLONOPIN) 1 MG tablet Take 3 mg by mouth at bedtime.      clotrimazole (MYCELEX) 10 MG troche      cyclobenzaprine (FLEXERIL) 10 MG tablet Take 10 mg by mouth 2 (two) times daily as needed.     DEXILANT 60 MG capsule TAKE 1 CAPSULE BY MOUTH  DAILY 90 capsule 3   diphenhydrAMINE (BENADRYL) 25 MG tablet Take 25 mg by mouth at bedtime as needed. Reported on 05/23/2016     hydrALAZINE (APRESOLINE) 25 MG tablet Take 1 tablet (25 mg total) by mouth 2 (two) times daily as  needed (high pressure, headache). 180 tablet 3  losartan-hydrochlorothiazide (HYZAAR) 50-12.5 MG tablet Take 1 tablet by mouth daily. 90 tablet 3   metoprolol tartrate (LOPRESSOR) 25 MG tablet Take 1 tablet (25 mg total) by mouth 2 (two) times daily as needed (tachycardia). 180 tablet 3   nystatin (MYCOSTATIN) 100000 UNIT/ML suspension Take by mouth.     olopatadine (PATANOL) 0.1 % ophthalmic solution Place 1 drop into both eyes 4 (four) times daily as needed.      triamcinolone (NASACORT) 55 MCG/ACT AERO nasal inhaler Place 2 sprays into the nose daily. Reported on 05/23/2016     valACYclovir (VALTREX) 500 MG tablet TAKE 1 TABLET BY MOUTH 2  TIMES DAILY FOR 3 DAYS AS  NEEDED FOR SYMPTOMS 30 tablet 0   zaleplon (SONATA) 10 MG capsule Take 10 mg by mouth daily.     No current facility-administered medications on file prior to visit.      ROS:  Review of Systems  Constitutional:  Positive for fatigue. Negative for fever and unexpected weight change.  Respiratory:  Negative for cough, shortness of breath and wheezing.   Cardiovascular:  Negative for chest pain, palpitations and leg swelling.  Gastrointestinal:  Positive for constipation and diarrhea. Negative for blood in stool, nausea and vomiting.  Endocrine: Negative for cold intolerance, heat intolerance and polyuria.  Genitourinary:  Negative for dyspareunia, dysuria, flank pain, frequency, genital sores, hematuria, menstrual problem, pelvic pain, urgency, vaginal bleeding, vaginal discharge and vaginal pain.  Musculoskeletal:  Positive for arthralgias. Negative for back pain, joint swelling and myalgias.  Skin:  Positive for rash.  Neurological:  Negative for dizziness, syncope, light-headedness, numbness and headaches.  Hematological:  Negative for adenopathy.  Psychiatric/Behavioral:  Positive for agitation and dysphoric mood. Negative for confusion, sleep disturbance and suicidal ideas. The patient is not nervous/anxious.      Objective: BP 130/80   Ht 5\' 4"  (1.626 m)   Wt 229 lb (103.9 kg)   BMI 39.31 kg/m    Physical Exam Constitutional:      Appearance: She is well-developed.  Genitourinary:     Vulva normal.     Right Labia: tenderness.     Right Labia: No rash or lesions.    Left Labia: tenderness.     Left Labia: No lesions or rash.       No vaginal discharge, erythema or tenderness.      Right Adnexa: not tender and no mass present.    Left Adnexa: not tender and no mass present.    No cervical friability or polyp.     Uterus is not enlarged or tender.  Rectum:     Guaiac result negative.     External hemorrhoid present.     No tenderness.  Breasts:    Right: No mass, nipple discharge, skin change or tenderness.     Left: No mass, nipple discharge, skin change or tenderness.  Neck:     Thyroid: No thyromegaly.  Cardiovascular:     Rate and Rhythm: Normal rate and regular rhythm.     Heart sounds: Normal heart sounds. No murmur heard. Pulmonary:     Effort: Pulmonary effort is normal.     Breath sounds: Normal breath sounds.  Abdominal:     Palpations: Abdomen is soft.     Tenderness: There is no abdominal tenderness. There is no guarding or rebound.  Musculoskeletal:        General: Normal range of motion.     Cervical back: Normal range of motion.  Lymphadenopathy:  Cervical: No cervical adenopathy.  Neurological:     General: No focal deficit present.     Mental Status: She is alert and oriented to person, place, and time.     Cranial Nerves: No cranial nerve deficit.  Skin:    General: Skin is warm and dry.  Psychiatric:        Mood and Affect: Mood normal.        Behavior: Behavior normal.        Thought Content: Thought content normal.        Judgment: Judgment normal.  Vitals reviewed.   Assessment/Plan: Encounter for annual routine gynecological examination  Encounter for screening mammogram for malignant neoplasm of breast - Plan: MM 3D SCREEN BREAST  BILATERAL; pt to sched mammo  Screening for colon cancer--pt to call GI to sched f/u  Herpes simplex vulvovaginitis--pt to call for valtrex RF prn.  Lichen sclerosus - Plan: clobetasol ointment (TEMOVATE) 0.05 %; Rx RF. Treat perineal/perianal area QOD for better sx control. AAA identified to pt since can't see area  Tinea corporis - Under breasts/panus due to sweat. Rx RF terconazole (given to pt by derm) to [facility]. Try antifungal powder as preventive. - Plan: terconazole (TERAZOL 7) 0.4 % vaginal cream  Meds ordered this encounter  Medications   terconazole (TERAZOL 7) 0.4 % vaginal cream    Sig: AAA BID prn sx    Dispense:  45 g    Refill:  0    Order Specific Question:   Supervising Provider    Answer:   Gae Dry [[id number]]   clobetasol ointment (TEMOVATE) 0.05 %    Sig: Apply to affected area 2-3 times wkly as maintenance    Dispense:  60 g    Refill:  1    Order Specific Question:   Supervising Provider    Answer:   Gae Dry [[id number]]   desonide (DESOWEN) 0.05 % cream    Sig: Apply topically 2 (two) times daily.    Dispense:  60 g    Refill:  0    Order Specific Question:   Supervising Provider    Answer:   Gae Dry [[id number]]              GYN counsel breast self exam, mammography screening, menopause, adequate intake of calcium and vitamin D, diet and exercise     F/U  Return in about 1 year (around 06/13/2022).  Ellianne Gowen B. Rashia Mckesson, PA-C 06/13/2021 5:19 PM

## 2021-06-13 NOTE — Patient Instructions (Signed)
I value your feedback and you entrusting us with your care. If you get a [place] patient survey, I would appreciate you taking the time to let us know about your experience today. Thank you!   [facility]: [phone]  

## 2021-07-03 ENCOUNTER — Other Ambulatory Visit: Payer: Self-pay

## 2021-07-03 MED ORDER — DEXLANSOPRAZOLE 60 MG PO CPDR
1.0000 | DELAYED_RELEASE_CAPSULE | Freq: Every day | ORAL | 3 refills | Status: DC
Start: 2021-07-03 — End: 2021-09-13

## 2021-09-03 ENCOUNTER — Ambulatory Visit: Payer: 59 | Admitting: Gastroenterology

## 2021-09-13 ENCOUNTER — Encounter: Payer: Self-pay | Admitting: Gastroenterology

## 2021-09-13 ENCOUNTER — Other Ambulatory Visit: Payer: Self-pay

## 2021-09-13 ENCOUNTER — Ambulatory Visit: Payer: 59 | Admitting: Gastroenterology

## 2021-09-13 VITALS — BP 138/94 | HR 106 | Ht 64.0 in | Wt 229.8 lb

## 2021-09-13 DIAGNOSIS — K582 Mixed irritable bowel syndrome: Secondary | ICD-10-CM | POA: Diagnosis not present

## 2021-09-13 DIAGNOSIS — Z86010 Personal history of colonic polyps: Secondary | ICD-10-CM

## 2021-09-13 MED ORDER — CHOLESTYRAMINE 4 G PO PACK: 4.0000 g | PACK | Freq: Four times a day (QID) | ORAL | 0 refills | Status: DC

## 2021-09-13 MED ORDER — CLENPIQ 10-3.5-12 MG-GM -GM/160ML PO SOLN: 320.0000 mL | ORAL | 0 refills | Status: DC

## 2021-09-13 MED ORDER — DEXLANSOPRAZOLE 60 MG PO CPDR
1.0000 | DELAYED_RELEASE_CAPSULE | Freq: Every day | ORAL | 3 refills | Status: DC
Start: 2021-09-13 — End: 2022-09-04

## 2021-09-13 NOTE — Progress Notes (Signed)
Primary Care Physician: Patient, No Pcp Per (Inactive)  Primary Gastroenterologist:  Dr. [name]  Chief Complaint  Patient presents with   Follow-up    HPI: Ashley Floyd is a 50 y.o. female here with this history of GERD and irritable bowel syndrome with diarrhea.  She reports that she sometimes has some constipation and thinks she has irritable bowel syndrome with alternating diarrhea and constipation.  The patient states that she has some questions regarding the PPI and its side effects.  The patient also reports that she had spoke to her husband about BAM (bile acid malabsorption).  She reports that she has increased abdominal discomfort if she does not eat.  There is also no report of any unexplained weight loss fevers chills nausea vomiting black stools or bloody stools.  Past Medical History:  Diagnosis Date   Anxiety    a. per epic care everywhere & patient   Arthritis    Toes, knees   Bipolar affective disorder ([place])    Complication of anesthesia    PT STATES SHE HAS A SMALL MOUTH AND HAS HAD DENTAL WORK ON HER 2 FRONT TEETH AND SHE WANTS EXTRA CARE TAKEN FOR HER TEETH   Depression    Dysmenorrhea    Endometriosis    Family history of adverse reaction to anesthesia    PTS MOM HAS TROUBLE WITH ANESTHESIA DUE TO HER SMOKING HISTORY   GERD (gastroesophageal reflux disease)    Headache    sinus/stress/BP   Hemorrhoid    Herpes genitalis    History of mammogram 03/22/2015   BI-RADS 1 AT [facility]   History of nuclear stress test    a. 08/2015: no EKG changes concerning for ischemia, normal wall motion, EF >60%, low risk study   History of Papanicolaou smear of cervix 03/08/2015   -/-   Hypertension    IBS (irritable bowel syndrome)    Infertility, female    [facility] PT IN THE PAST   Lichen sclerosus    Obesity    Ovarian cyst 1998   right   PMDD (premenstrual dysphoric disorder)    PONV (postoperative nausea and vomiting)    Shortness of breath dyspnea    Sinus  infection    Sleep apnea    "MILD"-NO CPAP-LAST SLEEP STUDY YEARS AGO   Vertigo    rare    Current Outpatient Medications  Medication Sig Dispense Refill   ALREX 0.2 % SUSP USE 1 DROP(S) IN BOTH EYES 4 TIMES A DAY AS NEEDED [PRN]     carbamazepine (TEGRETOL) 200 MG tablet Take 800 mg by mouth at bedtime.     clobetasol ointment (TEMOVATE) 0.05 % Apply to affected area 2-3 times wkly as maintenance 60 g 1   clonazePAM (KLONOPIN) 1 MG tablet Take 3 mg by mouth at bedtime.      clotrimazole (MYCELEX) 10 MG troche      cyclobenzaprine (FLEXERIL) 10 MG tablet Take 10 mg by mouth 2 (two) times daily as needed.     desonide (DESOWEN) 0.05 % cream Apply topically 2 (two) times daily. 60 g 0   dexlansoprazole (DEXILANT) 60 MG capsule Take 1 capsule (60 mg total) by mouth daily. 90 capsule 3   diphenhydrAMINE (BENADRYL) 25 MG tablet Take 25 mg by mouth at bedtime as needed. Reported on 05/23/2016     hydrALAZINE (APRESOLINE) 25 MG tablet Take 1 tablet (25 mg total) by mouth 2 (two) times daily as needed (high pressure, headache). 180 tablet  3   losartan-hydrochlorothiazide (HYZAAR) 50-12.5 MG tablet Take 1 tablet by mouth daily. 90 tablet 3   metoprolol tartrate (LOPRESSOR) 25 MG tablet Take 1 tablet (25 mg total) by mouth 2 (two) times daily as needed (tachycardia). 180 tablet 3   nystatin (MYCOSTATIN) 100000 UNIT/ML suspension Take by mouth.     olopatadine (PATANOL) 0.1 % ophthalmic solution Place 1 drop into both eyes 4 (four) times daily as needed.      terconazole (TERAZOL 7) 0.4 % vaginal cream AAA BID prn sx 45 g 0   triamcinolone (NASACORT) 55 MCG/ACT AERO nasal inhaler Place 2 sprays into the nose daily. Reported on 05/23/2016     valACYclovir (VALTREX) 500 MG tablet TAKE 1 TABLET BY MOUTH 2  TIMES DAILY FOR 3 DAYS AS  NEEDED FOR SYMPTOMS 30 tablet 0   zaleplon (SONATA) 10 MG capsule Take 10 mg by mouth daily.     No current facility-administered medications for this visit.    Allergies  as of 09/13/2021 - Review Complete 09/13/2021  Allergen Reaction Noted   Cephalosporins Rash and Itching 03/22/2015   Progesterone  10/18/2020   Adhesive [tape] Rash 11/30/2015   Cefdinir Itching 07/27/2015   Penicillins Rash and Other (See Comments) 03/22/2015    ROS:  General: Negative for anorexia, weight loss, fever, chills, fatigue, weakness. ENT: Negative for hoarseness, difficulty swallowing , nasal congestion. CV: Negative for chest pain, angina, palpitations, dyspnea on exertion, peripheral edema.  Respiratory: Negative for dyspnea at rest, dyspnea on exertion, cough, sputum, wheezing.  GI: See history of present illness. GU:  Negative for dysuria, hematuria, urinary incontinence, urinary frequency, nocturnal urination.  Endo: Negative for unusual weight change.    Physical Examination:   BP (!) 138/94 (BP Location: Left Arm, Patient Position: Sitting, Cuff Size: Large)   Pulse (!) 106   Ht 5\' 4"  (1.626 m)   Wt 229 lb 12.8 oz (104.2 kg)   BMI 39.45 kg/m   General: Well-nourished, well-developed in no acute distress.  Eyes: No icterus. Conjunctivae pink. Neuro: Alert and oriented x 3.  Grossly intact. Skin: Warm and dry, no jaundice.   Psych: Alert and cooperative, normal mood and affect.  Labs:    Imaging Studies: No results found.  Assessment and Plan:   Ashley Floyd is a 50 y.o. y/o female who comes in today with a history of alternating diarrhea and constipation and reports that she has had her gallbladder out and is worried about bile acid malabsorption.  The patient has been told the risks of a PPI use long-term and she reports that if she stops the medication she has consistent heartburn.  The patient will also be tried on a trial of Questran to see if that helps with her irritable bowel syndrome symptoms since she is worried about the removal of her gallbladder exacerbating her symptoms.  The patient will also have her Dexilant refilled.  The patient is due  for colonoscopy due to a 15 mm polyp that was removed.  The patient was supposed to have the colonoscopy in June but she states she got COVID.  The patient has been explained the plan agrees with it.     [name], MD. Mar[facility] Regal    Note: This dictation was prepared with Dragon dictation along with smaller phrase technology. Any transcriptional errors that result from this process are unintentional.

## 2021-09-17 ENCOUNTER — Encounter: Payer: Self-pay | Admitting: Gastroenterology

## 2021-09-18 ENCOUNTER — Encounter: Payer: Self-pay | Admitting: Anesthesiology

## 2021-10-05 ENCOUNTER — Other Ambulatory Visit: Payer: Self-pay | Admitting: Gastroenterology

## 2021-10-12 ENCOUNTER — Ambulatory Visit: Admission: RE | Admit: 2021-10-12 | Payer: 59 | Source: Home / Self Care | Admitting: Gastroenterology

## 2021-10-12 SURGERY — COLONOSCOPY WITH PROPOFOL
Anesthesia: Choice

## 2021-10-22 ENCOUNTER — Other Ambulatory Visit: Payer: Self-pay

## 2021-10-22 DIAGNOSIS — Z86010 Personal history of colonic polyps: Secondary | ICD-10-CM

## 2021-10-24 ENCOUNTER — Telehealth: Payer: Self-pay

## 2021-10-24 NOTE — Telephone Encounter (Signed)
Please schedule nurse visit for tomorrow for UTI symptoms

## 2021-10-25 ENCOUNTER — Other Ambulatory Visit: Payer: Self-pay

## 2021-10-25 ENCOUNTER — Encounter: Payer: Self-pay | Admitting: Obstetrics and Gynecology

## 2021-10-25 ENCOUNTER — Ambulatory Visit: Payer: 59 | Admitting: Obstetrics and Gynecology

## 2021-10-25 VITALS — BP 142/100 | Ht 64.0 in | Wt 230.0 lb

## 2021-10-25 DIAGNOSIS — R399 Unspecified symptoms and signs involving the genitourinary system: Secondary | ICD-10-CM | POA: Diagnosis not present

## 2021-10-25 DIAGNOSIS — N898 Other specified noninflammatory disorders of vagina: Secondary | ICD-10-CM | POA: Diagnosis not present

## 2021-10-25 DIAGNOSIS — B370 Candidal stomatitis: Secondary | ICD-10-CM | POA: Diagnosis not present

## 2021-10-25 LAB — POCT URINALYSIS DIPSTICK
Bilirubin, UA: NEGATIVE
Blood, UA: NEGATIVE
Glucose, UA: NEGATIVE
Ketones, UA: NEGATIVE
Nitrite, UA: NEGATIVE
Protein, UA: NEGATIVE
Spec Grav, UA: 1.02 (ref 1.010–1.025)
pH, UA: 5 (ref 5.0–8.0)

## 2021-10-25 MED ORDER — FLUCONAZOLE 150 MG PO TABS: 150.0000 mg | ORAL_TABLET | Freq: Once | ORAL | 0 refills | Status: AC

## 2021-10-25 MED ORDER — NITROFURANTOIN MONOHYD MACRO 100 MG PO CAPS: 100.0000 mg | ORAL_CAPSULE | Freq: Two times a day (BID) | ORAL | 0 refills | Status: AC

## 2021-10-25 NOTE — Telephone Encounter (Signed)
Patient is scheduled for today at 4:30 with ABC

## 2021-10-25 NOTE — Progress Notes (Signed)
Patient, No Pcp Per (Inactive)   Chief Complaint  Patient presents with   Urinary Tract Infection    Frequency and burning urinating x 4 days    HPI:      [name] is a 50 y.o. G1P0010 whose LMP was Patient's last menstrual period was 11/15/2019., presents today for UTI sx of frequency with and wihtout good flow, urgency, dysuria, urine odor for 4 days. No hematuria, LBP, pelvic pain, fevers. Drinks lots of caffeine, little water. Hx of UTI sx in past with neg C&S. Did have E. Coli on C&S 2019, susceptible to macrobid. Was on abx for sinusitis 10/22 and pt developed thrush. Swishing with nystatin without full relief. Also with some mild vaginal itching/irritation/d/c. No meds to treat.    Patient Active Problem List   Diagnosis Date Noted   Tinea corporis 12/26/2020   Hematochezia 10/28/2020   H/O colonoscopy with polypectomy 10/26/20 10/28/2020   Colon cancer screening    Polyp of ascending colon    Perimenopausal vasomotor symptoms 06/08/2020   Strain of lumbar region 08/17/2019   Perimenopause 05/03/2019   Allergic rhinitis 05/15/2017   Endometriosis 05/15/2017   Acid reflux 05/15/2017   Brash 05/15/2017   Adaptive colitis 05/15/2017   Cannot sleep 70/17/7939   Lichen sclerosus 03/00/9233   Genital herpes 03/24/2017   Preoperative cardiovascular examination 10/26/2015   Gallstones 10/23/2015   Opacity of lung on imaging study 10/16/2015   Epigastric pain 10/04/2015   Atypical chest pain 09/22/2015   Anxiety    IBS (irritable bowel syndrome) 07/26/2015   Obesity, Class II, BMI 35-39.9 07/26/2015   Pain in the chest 03/22/2015   SOB (shortness of breath) on exertion 03/22/2015   Bilateral leg edema 03/22/2015   Obesity, Class III, BMI 40-49.9 (morbid obesity) ([place]) 03/22/2015   Clinical depression 03/22/2015   Affective bipolar disorder ([place]) 03/22/2015   Essential hypertension 03/22/2015    Past Surgical History:  Procedure Laterality Date    CHOLECYSTECTOMY N/A 12/07/2015   Procedure: LAPAROSCOPIC CHOLECYSTECTOMY ;  Surgeon: Robert Bellow, MD;  Location: [facility];  Service: General;  Laterality: N/A;   COLONOSCOPY WITH PROPOFOL N/A 10/27/2020   Procedure: COLONOSCOPY WITH BIOPSY;  Surgeon: [name], MD;  Location: [place];  Service: Endoscopy;  Laterality: N/A;  sleep apnea   DIAGNOSTIC LAPAROSCOPY  1998; 2006   OSIS; OVARIAN CYST (2006 AT [facility])   OVARIAN CYST REMOVAL     POLYPECTOMY N/A 10/27/2020   Procedure: POLYPECTOMY;  Surgeon: [name], MD;  Location: [place];  Service: Endoscopy;  Laterality: N/A;  Clip x 2 placed at Cec[facility] Polyp Removal   UPPER GI ENDOSCOPY  2015   Dr Allen Norris   URETHRAL DILATION      Family History  Problem Relation Age of Onset   Arrhythmia Mother    Cancer Mother        MELANOMA OF SKIN   Broken bones Mother        neck   Hypertension Maternal Grandmother    Hypertension Maternal Grandfather    Heart disease Paternal Grandmother    Heart attack Paternal Grandmother    Diabetes Paternal Grandmother    Diabetes Father    Cancer Paternal Grandfather        MAL NEO OF SKIN; LUNGS   Heart failure Maternal Aunt    Heart disease Maternal Aunt        CABG   Diabetes Paternal Aunt    Diabetes Other  Social History   Socioeconomic History   Marital status: Married    Spouse name: Not on file   Number of children: 0   Years of education: 16   Highest education level: Not on file  Occupational History   Not on file  Tobacco Use   Smoking status: Never   Smokeless tobacco: Never  Vaping Use   Vaping Use: Never used  Substance and Sexual Activity   Alcohol use: Not Currently    Alcohol/week: 0.0 standard drinks    Comment: rarely   Drug use: No   Sexual activity: Not Currently    Birth control/protection: Post-menopausal  Other Topics Concern   Not on file  Social History Narrative   Not on file   Social Determinants of Health   Financial  Resource Strain: Not on file  Food Insecurity: Not on file  Transportation Needs: Not on file  Physical Activity: Not on file  Stress: Not on file  Social Connections: Not on file  Intimate Partner Violence: Not on file    Outpatient Medications Prior to Visit  Medication Sig Dispense Refill   ALREX 0.2 % SUSP USE 1 DROP(S) IN BOTH EYES 4 TIMES A DAY AS NEEDED [PRN]     carbamazepine (TEGRETOL) 200 MG tablet Take 800 mg by mouth at bedtime.     cholestyramine (QUESTRAN) 4 g packet Take 1 packet (4 g total) by mouth 4 (four) times daily. 120 each 0   clobetasol ointment (TEMOVATE) 0.05 % Apply to affected area 2-3 times wkly as maintenance 60 g 1   clonazePAM (KLONOPIN) 1 MG tablet Take 3 mg by mouth at bedtime.      clotrimazole (MYCELEX) 10 MG troche      cyclobenzaprine (FLEXERIL) 10 MG tablet Take 10 mg by mouth 2 (two) times daily as needed.     desonide (DESOWEN) 0.05 % cream Apply topically 2 (two) times daily. 60 g 0   dexlansoprazole (DEXILANT) 60 MG capsule Take 1 capsule (60 mg total) by mouth daily. 90 capsule 3   diphenhydrAMINE (BENADRYL) 25 MG tablet Take 25 mg by mouth at bedtime as needed. Reported on 05/23/2016     hydrALAZINE (APRESOLINE) 25 MG tablet Take 1 tablet (25 mg total) by mouth 2 (two) times daily as needed (high pressure, headache). 180 tablet 3   losartan-hydrochlorothiazide (HYZAAR) 50-12.5 MG tablet Take 1 tablet by mouth daily. 90 tablet 3   metoprolol tartrate (LOPRESSOR) 25 MG tablet Take 1 tablet (25 mg total) by mouth 2 (two) times daily as needed (tachycardia). 180 tablet 3   naproxen (NAPROSYN) 500 MG tablet Take 500 mg by mouth 2 (two) times daily as needed.     nystatin (MYCOSTATIN) 100000 UNIT/ML suspension Take by mouth.     olopatadine (PATANOL) 0.1 % ophthalmic solution Place 1 drop into both eyes 4 (four) times daily as needed.      Sod Picosulfate-Mag Ox-Cit Acd (CLENPIQ) 10-3.5-12 MG-GM -GM/160ML SOLN Take 320 mLs by mouth as directed. 320 mL  0   terconazole (TERAZOL 7) 0.4 % vaginal cream AAA BID prn sx 45 g 0   triamcinolone (NASACORT) 55 MCG/ACT AERO nasal inhaler Place 2 sprays into the nose daily. Reported on 05/23/2016     valACYclovir (VALTREX) 500 MG tablet TAKE 1 TABLET BY MOUTH 2  TIMES DAILY FOR 3 DAYS AS  NEEDED FOR SYMPTOMS 30 tablet 0   zaleplon (SONATA) 10 MG capsule Take 10 mg by mouth daily.     No  facility-administered medications prior to visit.      ROS:  Review of Systems  Constitutional:  Negative for fever.  Gastrointestinal:  Negative for blood in stool, constipation, diarrhea, nausea and vomiting.  Genitourinary:  Positive for dysuria, frequency and urgency. Negative for dyspareunia, flank pain, hematuria, vaginal bleeding, vaginal discharge and vaginal pain.  Musculoskeletal:  Negative for back pain.  Skin:  Negative for rash.  BREAST: No symptoms   OBJECTIVE:   Vitals:  BP (!) 142/100   Ht 5\' 4"  (1.626 m)   Wt 230 lb (104.3 kg)   LMP 11/15/2019   BMI 39.48 kg/m   Physical Exam Vitals reviewed.  Constitutional:      Appearance: She is well-developed.  Pulmonary:     Effort: Pulmonary effort is normal.  Musculoskeletal:        General: Normal range of motion.     Cervical back: Normal range of motion.  Skin:    General: Skin is warm and dry.  Neurological:     General: No focal deficit present.     Mental Status: She is alert and oriented to person, place, and time.     Cranial Nerves: No cranial nerve deficit.  Psychiatric:        Mood and Affect: Mood normal.        Behavior: Behavior normal.        Thought Content: Thought content normal.        Judgment: Judgment normal.    Results: Results for orders placed or performed in visit on 10/25/21 (from the past 24 hour(s))  POCT Urinalysis Dipstick     Status: Abnormal   Collection Time: 10/25/21  4:59 PM  Result Value Ref Range   Color, UA amber    Clarity, UA clear    Glucose, UA Negative Negative   Bilirubin, UA neg     Ketones, UA neg    Spec Grav, UA 1.020 1.010 - 1.025   Blood, UA neg    pH, UA 5.0 5.0 - 8.0   Protein, UA Negative Negative   Urobilinogen, UA     Nitrite, UA neg    Leukocytes, UA Moderate (2+) (A) Negative   Appearance     Odor       Assessment/Plan: UTI symptoms - Plan: nitrofurantoin, macrocrystal-monohydrate, (MACROBID) 100 MG capsule, POCT Urinalysis Dipstick, Urine Culture; pos sx and UA. Rx macrobid, check C&S. D/C caffeine, increase water. F/u prn.  Thrush - Plan: fluconazole (DIFLUCAN) 150 MG tablet  Vaginal itching - Plan: fluconazole (DIFLUCAN) 150 MG tablet; after abx use. Rx diflucan. F/u prn.    Meds ordered this encounter  Medications   fluconazole (DIFLUCAN) 150 MG tablet    Sig: Take 1 tablet (150 mg total) by mouth once for 1 dose. May repeat in 3 days if still having symptoms    Dispense:  2 tablet    Refill:  0    Order Specific Question:   Supervising Provider    Answer:   Gae Dry [[id number]]   nitrofurantoin, macrocrystal-monohydrate, (MACROBID) 100 MG capsule    Sig: Take 1 capsule (100 mg total) by mouth 2 (two) times daily for 5 days.    Dispense:  10 capsule    Refill:  0    Order Specific Question:   Supervising Provider    Answer:   Gae Dry [[id number]]      Return if symptoms worsen or fail to improve.  Maliah Pyles B. Drue Harr, PA-C 10/25/2021 5:00 PM

## 2021-10-26 ENCOUNTER — Encounter: Payer: Self-pay | Admitting: Obstetrics and Gynecology

## 2021-11-05 LAB — URINE CULTURE

## 2021-11-13 ENCOUNTER — Other Ambulatory Visit: Payer: Self-pay

## 2021-11-29 ENCOUNTER — Encounter: Payer: Self-pay | Admitting: Gastroenterology

## 2021-11-30 ENCOUNTER — Ambulatory Visit: Payer: 59 | Admitting: Anesthesiology

## 2021-11-30 ENCOUNTER — Encounter: Payer: Self-pay | Admitting: Gastroenterology

## 2021-11-30 ENCOUNTER — Ambulatory Visit
Admission: RE | Admit: 2021-11-30 | Discharge: 2021-11-30 | Disposition: A | Discharge status: Home / Self Care | Payer: 59 | Attending: Gastroenterology | Admitting: Gastroenterology

## 2021-11-30 ENCOUNTER — Encounter
Admission: RE | Disposition: A | Discharge status: Home / Self Care | Payer: Self-pay | Source: Home / Self Care | Attending: Gastroenterology

## 2021-11-30 ENCOUNTER — Other Ambulatory Visit: Payer: Self-pay

## 2021-11-30 DIAGNOSIS — D120 Benign neoplasm of cecum: Secondary | ICD-10-CM | POA: Insufficient documentation

## 2021-11-30 DIAGNOSIS — Z6841 Body Mass Index (BMI) 40.0 and over, adult: Secondary | ICD-10-CM | POA: Insufficient documentation

## 2021-11-30 DIAGNOSIS — D122 Benign neoplasm of ascending colon: Secondary | ICD-10-CM | POA: Insufficient documentation

## 2021-11-30 DIAGNOSIS — K219 Gastro-esophageal reflux disease without esophagitis: Secondary | ICD-10-CM | POA: Insufficient documentation

## 2021-11-30 DIAGNOSIS — G4730 Sleep apnea, unspecified: Secondary | ICD-10-CM | POA: Diagnosis not present

## 2021-11-30 DIAGNOSIS — K640 First degree hemorrhoids: Secondary | ICD-10-CM | POA: Diagnosis not present

## 2021-11-30 DIAGNOSIS — K5730 Diverticulosis of large intestine without perforation or abscess without bleeding: Secondary | ICD-10-CM | POA: Diagnosis not present

## 2021-11-30 DIAGNOSIS — E669 Obesity, unspecified: Secondary | ICD-10-CM | POA: Diagnosis not present

## 2021-11-30 DIAGNOSIS — K635 Polyp of colon: Secondary | ICD-10-CM | POA: Diagnosis not present

## 2021-11-30 DIAGNOSIS — I10 Essential (primary) hypertension: Secondary | ICD-10-CM | POA: Insufficient documentation

## 2021-11-30 DIAGNOSIS — Z860100 Personal history of colon polyps, unspecified: Secondary | ICD-10-CM

## 2021-11-30 HISTORY — PX: POLYPECTOMY: SHX5525

## 2021-11-30 HISTORY — PX: COLONOSCOPY WITH PROPOFOL: SHX5780

## 2021-11-30 SURGERY — COLONOSCOPY WITH PROPOFOL
Anesthesia: General | Site: Rectum

## 2021-11-30 MED ORDER — ACETAMINOPHEN 160 MG/5ML PO SOLN: 325.0000 mg | ORAL | Status: DC | PRN

## 2021-11-30 MED ORDER — SODIUM CHLORIDE 0.9 % IV SOLN: INTRAVENOUS | Status: DC

## 2021-11-30 MED ORDER — STERILE WATER FOR IRRIGATION IR SOLN
Status: DC | PRN
  Administered 2021-11-30: 1

## 2021-11-30 MED ORDER — ACETAMINOPHEN 325 MG PO TABS: 325.0000 mg | ORAL_TABLET | ORAL | Status: DC | PRN

## 2021-11-30 MED ORDER — LIDOCAINE HCL (CARDIAC) PF 100 MG/5ML IV SOSY
PREFILLED_SYRINGE | INTRAVENOUS | Status: DC | PRN
  Administered 2021-11-30: 30 mg via INTRAVENOUS

## 2021-11-30 MED ORDER — LACTATED RINGERS IV SOLN: INTRAVENOUS | Status: DC

## 2021-11-30 MED ORDER — PROPOFOL 10 MG/ML IV BOLUS
INTRAVENOUS | Status: DC | PRN
  Administered 2021-11-30: 20 mg via INTRAVENOUS
  Administered 2021-11-30: 30 mg via INTRAVENOUS
  Administered 2021-11-30: 20 mg via INTRAVENOUS
  Administered 2021-11-30: 30 mg via INTRAVENOUS
  Administered 2021-11-30: 20 mg via INTRAVENOUS
  Administered 2021-11-30 (×3): 30 mg via INTRAVENOUS
  Administered 2021-11-30: 150 mg via INTRAVENOUS
  Administered 2021-11-30 (×2): 30 mg via INTRAVENOUS

## 2021-11-30 SURGICAL SUPPLY — 22 items

## 2021-11-30 NOTE — H&P (Signed)
Ashley Lame, MD [address] Ashley [place], [place] [address] Phone:[phone] Fax : [phone]  Primary Care Physician:  Patient, No Pcp Per (Inactive) Primary Gastroenterologist:  Dr. Allen Norris  Pre-Procedure History & Physical: HPI:  Ashley Floyd is a 51 y.o. female is here for an colonoscopy.   Past Medical History:  Diagnosis Date   Anxiety    a. per epic care everywhere & patient   Arthritis    Toes, knees   Bipolar affective disorder ([place])    Complication of anesthesia    PT STATES SHE HAS A SMALL MOUTH AND HAS HAD DENTAL WORK ON HER 2 FRONT TEETH AND SHE WANTS EXTRA CARE TAKEN FOR HER TEETH   Depression    Dysmenorrhea    Endometriosis    Family history of adverse reaction to anesthesia    PTS MOM HAS TROUBLE WITH ANESTHESIA DUE TO HER SMOKING HISTORY   GERD (gastroesophageal reflux disease)    Headache    sinus/stress/BP   Hemorrhoid    Herpes genitalis    History of mammogram 03/22/2015   BI-RADS 1 AT [facility]   History of nuclear stress test    a. 08/2015: no EKG changes concerning for ischemia, normal wall motion, EF >60%, low risk study   History of Papanicolaou smear of cervix 03/08/2015   -/-   Hypertension    IBS (irritable bowel syndrome)    Infertility, female    [facility] PT IN THE PAST   Lichen sclerosus    Obesity    Ovarian cyst 1998   right   PMDD (premenstrual dysphoric disorder)    PONV (postoperative nausea and vomiting)    Shortness of breath dyspnea    Sinus infection    Sleep apnea    "MILD"-NO CPAP-LAST SLEEP STUDY YEARS AGO   Vertigo    rare    Past Surgical History:  Procedure Laterality Date   CHOLECYSTECTOMY N/A 12/07/2015   Procedure: LAPAROSCOPIC CHOLECYSTECTOMY ;  Surgeon: Robert Bellow, MD;  Location: [facility];  Service: General;  Laterality: N/A;   COLONOSCOPY WITH PROPOFOL N/A 10/27/2020   Procedure: COLONOSCOPY WITH BIOPSY;  Surgeon: Ashley Lame, MD;  Location: [place];  Service: Endoscopy;   Laterality: N/A;  sleep apnea   DIAGNOSTIC LAPAROSCOPY  1998; 2006   OSIS; OVARIAN CYST (2006 AT [facility])   OVARIAN CYST REMOVAL     POLYPECTOMY N/A 10/27/2020   Procedure: POLYPECTOMY;  Surgeon: Ashley Lame, MD;  Location: [place];  Service: Endoscopy;  Laterality: N/A;  Clip x 2 placed at Cecal Polyp Removal   UPPER GI ENDOSCOPY  2015   Dr Allen Norris   URETHRAL DILATION      Prior to Admission medications   Medication Sig Start Date End Date Taking? Authorizing Provider  ALREX 0.2 % SUSP USE 1 DROP(S) IN BOTH EYES 4 TIMES A DAY AS NEEDED [PRN] 11/28/18  Yes [provider]  carbamazepine (TEGRETOL) 200 MG tablet Take 800 mg by mouth at bedtime.   Yes [provider]  clobetasol ointment (TEMOVATE) 0.05 % Apply to affected area 2-3 times wkly as maintenance 01/17/96  Yes Copland, Alicia B, PA-C  clonazePAM (KLONOPIN) 1 MG tablet Take 3 mg by mouth at bedtime.  01/30/15  Yes [provider]  clotrimazole (MYCELEX) 10 MG troche  06/07/21  Yes [provider]  cyclobenzaprine (FLEXERIL) 10 MG tablet Take 10 mg by mouth 2 (two) times daily as needed. 12/01/20  Yes [provider]  desonide (DESOWEN) 0.05 % cream Apply topically 2 (two) times daily. 6/37/85  Yes Copland, Deirdre Evener, PA-C  dexlansoprazole (DEXILANT) 60 MG capsule Take 1 capsule (60 mg total) by mouth daily. 09/13/21  Yes Ashley Lame, MD  diphenhydrAMINE (BENADRYL) 25 MG tablet Take 25 mg by mouth at bedtime as needed. Reported on 05/23/2016   Yes [provider]  hydrALAZINE (APRESOLINE) 25 MG tablet Take 1 tablet (25 mg total) by mouth 2 (two) times daily as needed (high pressure, headache). 05/30/20  Yes Minna Merritts, MD  metoprolol tartrate (LOPRESSOR) 25 MG tablet Take 1 tablet (25 mg total) by mouth 2 (two) times daily as needed (tachycardia). 05/30/20  Yes Gollan, Kathlene November, MD  naproxen (NAPROSYN) 500 MG tablet Take 500 mg by mouth 2 (two) times daily as needed.   Yes  [provider]  nystatin (MYCOSTATIN) 100000 UNIT/ML suspension Take by mouth. 06/07/21  Yes [provider]  terconazole (TERAZOL 7) 0.4 % vaginal cream AAA BID prn sx 8/85/02  Yes Copland, Alicia B, PA-C  zaleplon (SONATA) 10 MG capsule Take 10 mg by mouth daily.   Yes [provider]  cholestyramine (QUESTRAN) 4 g packet TAKE 1 PACKET (4 G TOTAL) BY MOUTH 4 (FOUR) TIMES DAILY. Patient not taking: Reported on 11/29/2021 10/25/21   Ashley Lame, MD  losartan-hydrochlorothiazide [facility]) 50-12.5 MG tablet Take 1 tablet by mouth daily. Patient not taking: Reported on 11/29/2021 05/30/20   Minna Merritts, MD  olopatadine (PATANOL) 0.1 % ophthalmic solution Place 1 drop into both eyes 4 (four) times daily as needed.  10/02/20   [provider]  Sod Picosulfate-Mag Ox-Cit Acd (CLENPIQ) 10-3.5-12 MG-GM -GM/160ML SOLN Take 320 mLs by mouth as directed. 09/13/21   Ashley Lame, MD  triamcinolone (NASACORT) 55 MCG/ACT AERO nasal inhaler Place 2 sprays into the nose daily. Reported on 05/23/2016 Patient not taking: Reported on 11/29/2021    [provider]  valACYclovir (VALTREX) 500 MG tablet TAKE 1 TABLET BY MOUTH 2  TIMES DAILY FOR 3 DAYS AS  NEEDED FOR SYMPTOMS Patient not taking: Reported on 11/29/2021 7/74/12   Copland, Alicia B, PA-C    Allergies as of 10/22/2021 - Review Complete 09/17/2021  Allergen Reaction Noted   Cephalosporins Rash and Itching 03/22/2015   Progesterone  10/18/2020   Adhesive [tape] Rash 11/30/2015   Cefdinir Itching 07/27/2015   Penicillins Rash and Other (See Comments) 03/22/2015    Family History  Problem Relation Age of Onset   Arrhythmia Mother    Cancer Mother        MELANOMA OF SKIN   Broken bones Mother        neck   Hypertension Maternal Grandmother    Hypertension Maternal Grandfather    Heart disease Paternal Grandmother    Heart attack Paternal Grandmother    Diabetes Paternal Grandmother    Diabetes Father     Cancer Paternal Grandfather        MAL NEO OF SKIN; LUNGS   Heart failure Maternal Aunt    Heart disease Maternal Aunt        CABG   Diabetes Paternal Aunt    Diabetes Other     Social History   Socioeconomic History   Marital status: Married    Spouse name: Not on file   Number of children: 0   Years of education: 16   Highest education level: Not on file  Occupational History   Not on file  Tobacco Use  Smoking status: Never   Smokeless tobacco: Never  Vaping Use   Vaping Use: Never used  Substance and Sexual Activity   Alcohol use: Not Currently    Alcohol/week: 0.0 standard drinks    Comment: rarely   Drug use: No   Sexual activity: Not Currently    Birth control/protection: Post-menopausal  Other Topics Concern   Not on file  Social History Narrative   Not on file   Social Determinants of Health   Financial Resource Strain: Not on file  Food Insecurity: Not on file  Transportation Needs: Not on file  Physical Activity: Not on file  Stress: Not on file  Social Connections: Not on file  Intimate Partner Violence: Not on file    Review of Systems: See HPI, otherwise negative ROS  Physical Exam: BP (!) 160/106    Pulse 98    Temp 97.7 F (36.5 C) (Temporal)    Resp 18    Ht 5\' 4"  (1.626 m)    Wt 106.6 kg    LMP 11/15/2019    SpO2 95%    BMI 40.34 kg/m  General:   Alert,  pleasant and cooperative in NAD Head:  Normocephalic and atraumatic. Neck:  Supple; no masses or thyromegaly. Lungs:  Clear throughout to auscultation.    Heart:  Regular rate and rhythm. Abdomen:  Soft, nontender and nondistended. Normal bowel sounds, without guarding, and without rebound.   Neurologic:  Alert and  oriented x4;  grossly normal neurologically.  Impression/Plan: NIREL BABLER is here for an colonoscopy to be performed for a history of adenomatous polyps on 2021  Risks, benefits, limitations, and alternatives regarding  colonoscopy have been reviewed with the patient.   Questions have been answered.  All parties agreeable.   Ashley Lame, MD  11/30/2021, 9:33 AM

## 2021-11-30 NOTE — Anesthesia Procedure Notes (Signed)
Date/Time: 11/30/2021 9:46 AM Performed by: Cameron Ali, CRNA Pre-anesthesia Checklist: Patient identified, Emergency Drugs available, Suction available, Timeout performed and Patient being monitored Patient Re-evaluated:Patient Re-evaluated prior to induction Oxygen Delivery Method: Nasal cannula Placement Confirmation: positive ETCO2

## 2021-11-30 NOTE — Transfer of Care (Signed)
Immediate Anesthesia Transfer of Care Note  Patient: Ashley Floyd  Procedure(s) Performed: COLONOSCOPY WITH PROPOFOL (Rectum) POLYPECTOMY (Rectum)  Patient Location: PACU  Anesthesia Type: General  Level of Consciousness: awake, alert  and patient cooperative  Airway and Oxygen Therapy: Patient Spontanous Breathing and Patient connected to supplemental oxygen  Post-op Assessment: Post-op Vital signs reviewed, Patient's Cardiovascular Status Stable, Respiratory Function Stable, Patent Airway and No signs of Nausea or vomiting  Post-op Vital Signs: Reviewed and stable  Complications: No notable events documented.

## 2021-11-30 NOTE — Anesthesia Postprocedure Evaluation (Signed)
Anesthesia Post Note  Patient: Ashley Floyd  Procedure(s) Performed: COLONOSCOPY WITH PROPOFOL (Rectum) POLYPECTOMY (Rectum)     Patient location during evaluation: PACU Anesthesia Type: General Level of consciousness: awake and alert Pain management: pain level controlled Vital Signs Assessment: post-procedure vital signs reviewed and stable Respiratory status: spontaneous breathing, nonlabored ventilation, respiratory function stable and patient connected to nasal cannula oxygen Cardiovascular status: blood pressure returned to baseline and stable Postop Assessment: no apparent nausea or vomiting Anesthetic complications: no   No notable events documented.  Trecia Rogers

## 2021-11-30 NOTE — Anesthesia Preprocedure Evaluation (Signed)
Anesthesia Evaluation  Patient identified by MRN, date of birth, ID band Patient awake    Reviewed: Allergy & Precautions, H&P , NPO status , Patient's Chart, lab work & pertinent test results, reviewed documented beta blocker date and time   History of Anesthesia Complications (+) PONV and history of anesthetic complications  Airway Mallampati: III  TM Distance: >3 FB Neck ROM: full    Dental no notable dental hx.    Pulmonary sleep apnea ,    Pulmonary exam normal breath sounds clear to auscultation       Cardiovascular Exercise Tolerance: Good hypertension, Normal cardiovascular exam Rhythm:regular Rate:Normal     Neuro/Psych PSYCHIATRIC DISORDERS Anxiety Depression Bipolar Disorder negative neurological ROS     GI/Hepatic Neg liver ROS, GERD  Medicated and Controlled,  Endo/Other  negative endocrine ROS  Renal/GU negative Renal ROS  negative genitourinary   Musculoskeletal   Abdominal   Peds  Hematology negative hematology ROS (+)   Anesthesia Other Findings   Reproductive/Obstetrics negative OB ROS                             Anesthesia Physical Anesthesia Plan  ASA: 2  Anesthesia Plan: General   Post-op Pain Management:    Induction:   PONV Risk Score and Plan:   Airway Management Planned:   Additional Equipment:   Intra-op Plan:   Post-operative Plan:   Informed Consent: I have reviewed the patients History and Physical, chart, labs and discussed the procedure including the risks, benefits and alternatives for the proposed anesthesia with the patient or authorized representative who has indicated his/her understanding and acceptance.     Dental Advisory Given  Plan Discussed with: CRNA and Anesthesiologist  Anesthesia Plan Comments:         Anesthesia Quick Evaluation

## 2021-11-30 NOTE — Op Note (Signed)
[facility] Gastroenterology Patient Name: Ashley Floyd Procedure Date: 11/30/2021 9:37 AM MRN: [id number] Account #: [id number] Date of Birth: Dec 24, 1970 Admit Type: Outpatient Age: 51 Room: [facility] OR ROOM 01 Gender: Female Note Status: Finalized Instrument Name: [id number] Procedure:             Colonoscopy Indications:           High risk colon cancer surveillance: Personal history                         of colonic polyps Providers:             [name] MD, MD Referring MD:          no pcp as per patient Medicines:             Propofol per Anesthesia Complications:         No immediate complications. Procedure:             Pre-Anesthesia Assessment:                        - Prior to the procedure, a History and Physical was                         performed, and patient medications and allergies were                         reviewed. The patient's tolerance of previous                         anesthesia was also reviewed. The risks and benefits                         of the procedure and the sedation options and risks                         were discussed with the patient. All questions were                         answered, and informed consent was obtained. Prior                         Anticoagulants: The patient has taken no previous                         anticoagulant or antiplatelet agents. ASA Grade                         Assessment: II - A patient with mild systemic disease.                         After reviewing the risks and benefits, the patient                         was deemed in satisfactory condition to undergo the                         procedure.  After obtaining informed consent, the colonoscope was                         passed under direct vision. Throughout the procedure,                         the patient's blood pressure, pulse, and oxygen                         saturations were monitored continuously. The                          Colonoscope was introduced through the anus and                         advanced to the the cecum, identified by appendiceal                         orifice and ileocecal valve. The colonoscopy was                         performed without difficulty. The patient tolerated                         the procedure well. The quality of the bowel                         preparation was excellent. Findings:      The perianal and digital rectal examinations were normal.      A 7 mm polyp was found in the cecum. The polyp was sessile. The polyp       was removed with a cold snare. Resection and retrieval were complete.      Four sessile polyps were found in the ascending colon. The polyps were 4       to 7 mm in size. These polyps were removed with a cold snare. Resection       and retrieval were complete.      Multiple small-mouthed diverticula were found in the sigmoid colon.      Non-bleeding internal hemorrhoids were found during retroflexion. The       hemorrhoids were Grade I (internal hemorrhoids that do not prolapse). Impression:            - One 7 mm polyp in the cecum, removed with a cold                         snare. Resected and retrieved.                        - Four 4 to 7 mm polyps in the ascending colon,                         removed with a cold snare. Resected and retrieved.                        - Diverticulosis in the sigmoid colon.                        - Non-bleeding internal hemorrhoids. Recommendation:        -  Discharge patient to home.                        - Resume previous diet.                        - Continue present medications.                        - Await pathology results.                        - Repeat colonoscopy in 1 year for surveillance. Procedure Code(s):     --- Professional ---                        [phone], Colonoscopy, flexible; with removal of                         tumor(s), polyp(s), or other lesion(s) by snare                          technique Diagnosis Code(s):     --- Professional ---                        Z86.010, Personal history of colonic polyps                        K63.5, Polyp of colon CPT copyright 2019 American Medical Association. All rights reserved. The codes documented in this report are preliminary and upon coder review may  be revised to meet current compliance requirements. [name] MD, MD 11/30/2021 10:10:38 AM This report has been signed electronically. Number of Addenda: 0 Note Initiated On: 11/30/2021 9:37 AM Scope Withdrawal Time: 0 hours 17 minutes 28 seconds  Total Procedure Duration: 0 hours 23 minutes 50 seconds  Estimated Blood Loss:  Estimated blood loss: none.      [facility]

## 2021-12-03 ENCOUNTER — Encounter: Payer: Self-pay | Admitting: Gastroenterology

## 2021-12-04 ENCOUNTER — Other Ambulatory Visit: Payer: Self-pay

## 2021-12-04 ENCOUNTER — Ambulatory Visit
Admission: RE | Admit: 2021-12-04 | Discharge: 2021-12-04 | Disposition: A | Discharge status: Home / Self Care | Payer: 59 | Source: Ambulatory Visit

## 2021-12-04 VITALS — BP 151/97 | Temp 98.8°F | Resp 20 | Ht 64.0 in | Wt 235.0 lb

## 2021-12-04 DIAGNOSIS — R599 Enlarged lymph nodes, unspecified: Secondary | ICD-10-CM

## 2021-12-04 LAB — SURGICAL PATHOLOGY

## 2021-12-04 NOTE — Discharge Instructions (Signed)
Continue to monitor the lump at the base of your skull and return for increase in size or other changes.

## 2021-12-04 NOTE — ED Triage Notes (Signed)
Pt reports yesterday noticed a small knot to posterior base of head on the left. Not tender to touch, no redness, or drainage.   Pt reports "that area is where I have headaches"   Pt fell backwards and hit the back of her head back around thanksgiving, pt unsure if two are related, was not treated for fall.

## 2021-12-04 NOTE — ED Provider Notes (Signed)
[facility]    CSN: [id number] Arrival date & time: 12/04/21  1803      History   Chief Complaint Chief Complaint  Patient presents with   Cyst    HPI Ashley Floyd is a 51 y.o. female.   HPI  51 year old female here for evaluation of a knot at the base of her skull.  Patient reports that she noticed a knot at the base of the skull on the left-hand side yesterday.  She states is in the same area where she has been getting intermittent headaches for the last year or more.  She came in to be evaluated because she is concerned that it might be something that could lead to a stroke.  She denies any injury to that portion of her head recently.  She also denies any pain, dizziness, or changes in vision.  Patient does report that around Thanksgiving she suffered a fall onto a linoleum floor and struck the left side of her head but it was up higher than where she feels the not presently.  She states that that area is still tender to touch.  Past Medical History:  Diagnosis Date   Anxiety    a. per epic care everywhere & patient   Arthritis    Toes, knees   Bipolar affective disorder ([place])    Complication of anesthesia    PT STATES SHE HAS A SMALL MOUTH AND HAS HAD DENTAL WORK ON HER 2 FRONT TEETH AND SHE WANTS EXTRA CARE TAKEN FOR HER TEETH   Depression    Dysmenorrhea    Endometriosis    Family history of adverse reaction to anesthesia    PTS MOM HAS TROUBLE WITH ANESTHESIA DUE TO HER SMOKING HISTORY   GERD (gastroesophageal reflux disease)    Headache    sinus/stress/BP   Hemorrhoid    Herpes genitalis    History of mammogram 03/22/2015   BI-RADS 1 AT [facility]   History of nuclear stress test    a. 08/2015: no EKG changes concerning for ischemia, normal wall motion, EF >60%, low risk study   History of Papanicolaou smear of cervix 03/08/2015   -/-   Hypertension    IBS (irritable bowel syndrome)    Infertility, female    [facility] PT IN THE PAST   Lichen sclerosus     Obesity    Ovarian cyst 1998   right   PMDD (premenstrual dysphoric disorder)    PONV (postoperative nausea and vomiting)    Shortness of breath dyspnea    Sinus infection    Sleep apnea    "MILD"-NO CPAP-LAST SLEEP STUDY YEARS AGO   Vertigo    rare    Patient Active Problem List   Diagnosis Date Noted   Personal history of colonic polyps    Tinea corporis 12/26/2020   Hematochezia 10/28/2020   H/O colonoscopy with polypectomy 10/26/20 10/28/2020   Colon cancer screening    Polyp of ascending colon    Perimenopausal vasomotor symptoms 06/08/2020   Strain of lumbar region 08/17/2019   Perimenopause 05/03/2019   Allergic rhinitis 05/15/2017   Endometriosis 05/15/2017   Acid reflux 05/15/2017   Brash 05/15/2017   Adaptive colitis 05/15/2017   Cannot sleep 95/07/3266   Lichen sclerosus 12/45/8099   Genital herpes 03/24/2017   Preoperative cardiovascular examination 10/26/2015   Gallstones 10/23/2015   Opacity of lung on imaging study 10/16/2015   Epigastric pain 10/04/2015   Atypical chest pain 09/22/2015   Anxiety  IBS (irritable bowel syndrome) 07/26/2015   Obesity, Class II, BMI 35-39.9 07/26/2015   Pain in the chest 03/22/2015   SOB (shortness of breath) on exertion 03/22/2015   Bilateral leg edema 03/22/2015   Obesity, Class III, BMI 40-49.9 (morbid obesity) ([place]) 03/22/2015   Clinical depression 03/22/2015   Affective bipolar disorder ([place]) 03/22/2015   Essential hypertension 03/22/2015    Past Surgical History:  Procedure Laterality Date   CHOLECYSTECTOMY N/A 12/07/2015   Procedure: LAPAROSCOPIC CHOLECYSTECTOMY ;  Surgeon: Robert Bellow, MD;  Location: [facility];  Service: General;  Laterality: N/A;   COLONOSCOPY WITH PROPOFOL N/A 10/27/2020   Procedure: COLONOSCOPY WITH BIOPSY;  Surgeon: [name], MD;  Location: [place];  Service: Endoscopy;  Laterality: N/A;  sleep apnea   COLONOSCOPY WITH PROPOFOL N/A 11/30/2021   Procedure: COLONOSCOPY  WITH PROPOFOL;  Surgeon: [name], MD;  Location: [place];  Service: Endoscopy;  Laterality: N/A;   DIAGNOSTIC LAPAROSCOPY  1998; 2006   OSIS; OVARIAN CYST (2006 AT [facility])   OVARIAN CYST REMOVAL     POLYPECTOMY N/A 10/27/2020   Procedure: POLYPECTOMY;  Surgeon: [name], MD;  Location: [place];  Service: Endoscopy;  Laterality: N/A;  Clip x 2 placed at Cecal Polyp Removal   POLYPECTOMY N/A 11/30/2021   Procedure: POLYPECTOMY;  Surgeon: [name], MD;  Location: [place];  Service: Endoscopy;  Laterality: N/A;   UPPER GI ENDOSCOPY  2015   Dr Allen Norris   URETHRAL DILATION      OB History     Gravida  1   Para  0   Term  0   Preterm  0   AB  1   Living  0      SAB  0   IAB  0   Ectopic  0   Multiple  0   Live Births           Obstetric Comments  1st Menstrual Cycle:  10           Home Medications    Prior to Admission medications   Medication Sig Start Date End Date Taking? Authorizing Provider  ALREX 0.2 % SUSP USE 1 DROP(S) IN BOTH EYES 4 TIMES A DAY AS NEEDED [PRN] 11/28/18  Yes [provider]  carbamazepine (TEGRETOL) 200 MG tablet Take 800 mg by mouth at bedtime.   Yes [provider]  clobetasol ointment (TEMOVATE) 0.05 % Apply to affected area 2-3 times wkly as maintenance 7/37/10  Yes Copland, Alicia B, PA-C  clonazePAM (KLONOPIN) 1 MG tablet Take 3 mg by mouth at bedtime.  01/30/15  Yes [provider]  clotrimazole (MYCELEX) 10 MG troche  06/07/21  Yes [provider]  cyclobenzaprine (FLEXERIL) 10 MG tablet Take 10 mg by mouth 2 (two) times daily as needed. 12/01/20  Yes [provider]  desonide (DESOWEN) 0.05 % cream Apply topically 2 (two) times daily. 05/20/93  Yes Copland, Deirdre Evener, PA-C  dexlansoprazole (DEXILANT) 60 MG capsule Take 1 capsule (60 mg total) by mouth daily. 09/13/21  Yes [name], MD  diphenhydrAMINE (BENADRYL) 25 MG tablet Take 25 mg by mouth at  bedtime as needed. Reported on 05/23/2016   Yes [provider]  hydrALAZINE (APRESOLINE) 25 MG tablet Take 1 tablet (25 mg total) by mouth 2 (two) times daily as needed (high pressure, headache). 05/30/20  Yes Minna Merritts, MD  metoprolol tartrate (LOPRESSOR) 25 MG tablet Take 1 tablet (25 mg  total) by mouth 2 (two) times daily as needed (tachycardia). 05/30/20  Yes Gollan, Kathlene November, MD  naproxen (NAPROSYN) 500 MG tablet Take 500 mg by mouth 2 (two) times daily as needed.   Yes [provider]  nystatin (MYCOSTATIN) 100000 UNIT/ML suspension Take by mouth. 06/07/21  Yes [provider]  terconazole (TERAZOL 7) 0.4 % vaginal cream AAA BID prn sx 9/52/84  Yes Copland, Alicia B, PA-C  triamcinolone (NASACORT) 55 MCG/ACT AERO nasal inhaler Place 2 sprays into the nose daily. Reported on 05/23/2016   Yes [provider]  valACYclovir (VALTREX) 500 MG tablet TAKE 1 TABLET BY MOUTH 2  TIMES DAILY FOR 3 DAYS AS  NEEDED FOR SYMPTOMS 1/32/44  Yes Copland, Alicia B, PA-C  zaleplon (SONATA) 10 MG capsule Take 10 mg by mouth daily.   Yes [provider]  cholestyramine (QUESTRAN) 4 g packet TAKE 1 PACKET (4 G TOTAL) BY MOUTH 4 (FOUR) TIMES DAILY. Patient not taking: Reported on 11/29/2021 10/25/21   [name], MD  losartan-hydrochlorothiazide [facility]) 50-12.5 MG tablet Take 1 tablet by mouth daily. Patient not taking: Reported on 11/29/2021 05/30/20   Minna Merritts, MD  olopatadine (PATANOL) 0.1 % ophthalmic solution Place 1 drop into both eyes 4 (four) times daily as needed.  10/02/20   [provider]  Sod Picosulfate-Mag Ox-Cit Acd (CLENPIQ) 10-3.5-12 MG-GM -GM/160ML SOLN Take 320 mLs by mouth as directed. 09/13/21   [name], MD    Family History Family History  Problem Relation Age of Onset   Arrhythmia Mother    Cancer Mother        MELANOMA OF SKIN   Broken bones Mother        neck   Hypertension Maternal Grandmother    Hypertension  Maternal Grandfather    Heart disease Paternal Grandmother    Heart attack Paternal Grandmother    Diabetes Paternal Grandmother    Diabetes Father    Cancer Paternal Grandfather        MAL NEO OF SKIN; LUNGS   Heart failure Maternal Aunt    Heart disease Maternal Aunt        CABG   Diabetes Paternal Aunt    Diabetes Other     Social History Social History   Tobacco Use   Smoking status: Never   Smokeless tobacco: Never  Vaping Use   Vaping Use: Never used  Substance Use Topics   Alcohol use: Not Currently    Alcohol/week: 0.0 standard drinks    Comment: rarely   Drug use: No     Allergies   Cephalosporins, Progesterone, Adhesive [tape], Cefdinir, and Penicillins   Review of Systems Review of Systems  Constitutional:  Negative for activity change and appetite change.  Eyes:  Negative for visual disturbance.  Skin:  Negative for color change and wound.  Neurological:  Positive for headaches. Negative for dizziness.  Hematological: Negative.   Psychiatric/Behavioral: Negative.      Physical Exam Triage Vital Signs ED Triage Vitals  Enc Vitals Group     BP 12/04/21 1836 (!) 151/97     Pulse --      Resp 12/04/21 1836 20     Temp 12/04/21 1836 98.8 F (37.1 C)     Temp Source 12/04/21 1836 Oral     SpO2 12/04/21 1836 98 %     Weight 12/04/21 1839 235 lb (106.6 kg)     Height 12/04/21 1839 5\' 4"  (1.626 m)     Head  Circumference --      Peak Flow --      Pain Score 12/04/21 1838 1     Pain Loc --      Pain Edu? --      Excl. in [place]? --    No data found.  Updated Vital Signs BP (!) 151/97 (BP Location: Left Arm)    Temp 98.8 F (37.1 C) (Oral)    Resp 20    Ht 5\' 4"  (1.626 m)    Wt 235 lb (106.6 kg)    LMP 11/15/2019    SpO2 98%    BMI 40.34 kg/m   Visual Acuity Right Eye Distance:   Left Eye Distance:   Bilateral Distance:    Right Eye Near:   Left Eye Near:    Bilateral Near:     Physical Exam Vitals and nursing note reviewed.   Constitutional:      General: She is not in acute distress.    Appearance: Normal appearance. She is not ill-appearing.  HENT:     Head: Normocephalic and atraumatic.  Skin:    General: Skin is warm and dry.     Capillary Refill: Capillary refill takes less than 2 seconds.     Findings: No erythema, lesion or rash.  Neurological:     General: No focal deficit present.     Mental Status: She is alert and oriented to person, place, and time.  Psychiatric:        Mood and Affect: Mood normal.        Behavior: Behavior normal.        Thought Content: Thought content normal.        Judgment: Judgment normal.     UC Treatments / Results  Labs (all labs ordered are listed, but only abnormal results are displayed) Labs Reviewed - No data to display  EKG   Radiology No results found.  Procedures Procedures (including critical care time)  Medications Ordered in UC Medications - No data to display  Initial Impression / Assessment and Plan / UC Course  I have reviewed the triage vital signs and the nursing notes.  Pertinent labs & imaging results that were available during my care of the patient were reviewed by me and considered in my medical decision making (see chart for details).  Very pleasant, nontoxic-appearing 51 year old female here for evaluation of a knot at the base of the skull on the left-hand side that she first noticed yesterday.  It is not painful and is not causing any dizziness, headache, or changes in her vision.  She does not member being there previously and she is concerned it might be something that could cause a stroke as strokes run prominently on both sides of her family.  Patient does have high blood pressure and does not take her blood pressure medication on a regular basis however.  Patient tempted to see her primary care provider today but she has not seen her in so long that she is considered a new patient and they do not have any openings until May.   On exam there is no erythema, edema, or other surface abnormalities of the skin.  With deep palpation it is possible to feel a small, singular lymph node at the base of the occiput where the patient states that she feels the knot.  There are no other palpable lymph nodes in the chain and no lymph nodes palpated on the right side.  I suspect that patient has a  singly isolated reactive lymph node that may be secondary to the fall she sustained 5 or 6 weeks ago.  The left occipital parietal portion of the patient's scalp is still mildly swollen and tender from where she suffered a fall.   Final Clinical Impressions(s) / UC Diagnoses   Final diagnoses:  Reactive lymphadenopathy     Discharge Instructions      Continue to monitor the lump at the base of your skull and return for increase in size or other changes.    ED Prescriptions   None    PDMP not reviewed this encounter.   Margarette Canada, NP 12/04/21 1921

## 2021-12-05 ENCOUNTER — Encounter: Payer: Self-pay | Admitting: Gastroenterology

## 2021-12-07 IMAGING — CR DG CHEST 2V
1 series · 2 of 2 positions shown · non-contrast
Comparison: None.

CLINICAL DATA: Fever

EXAM:
CHEST - 2 VIEW

[Series 1: dg chest port 1 view · 0.14mm/px · 2 of 2 slices shown]
[im 1/2]
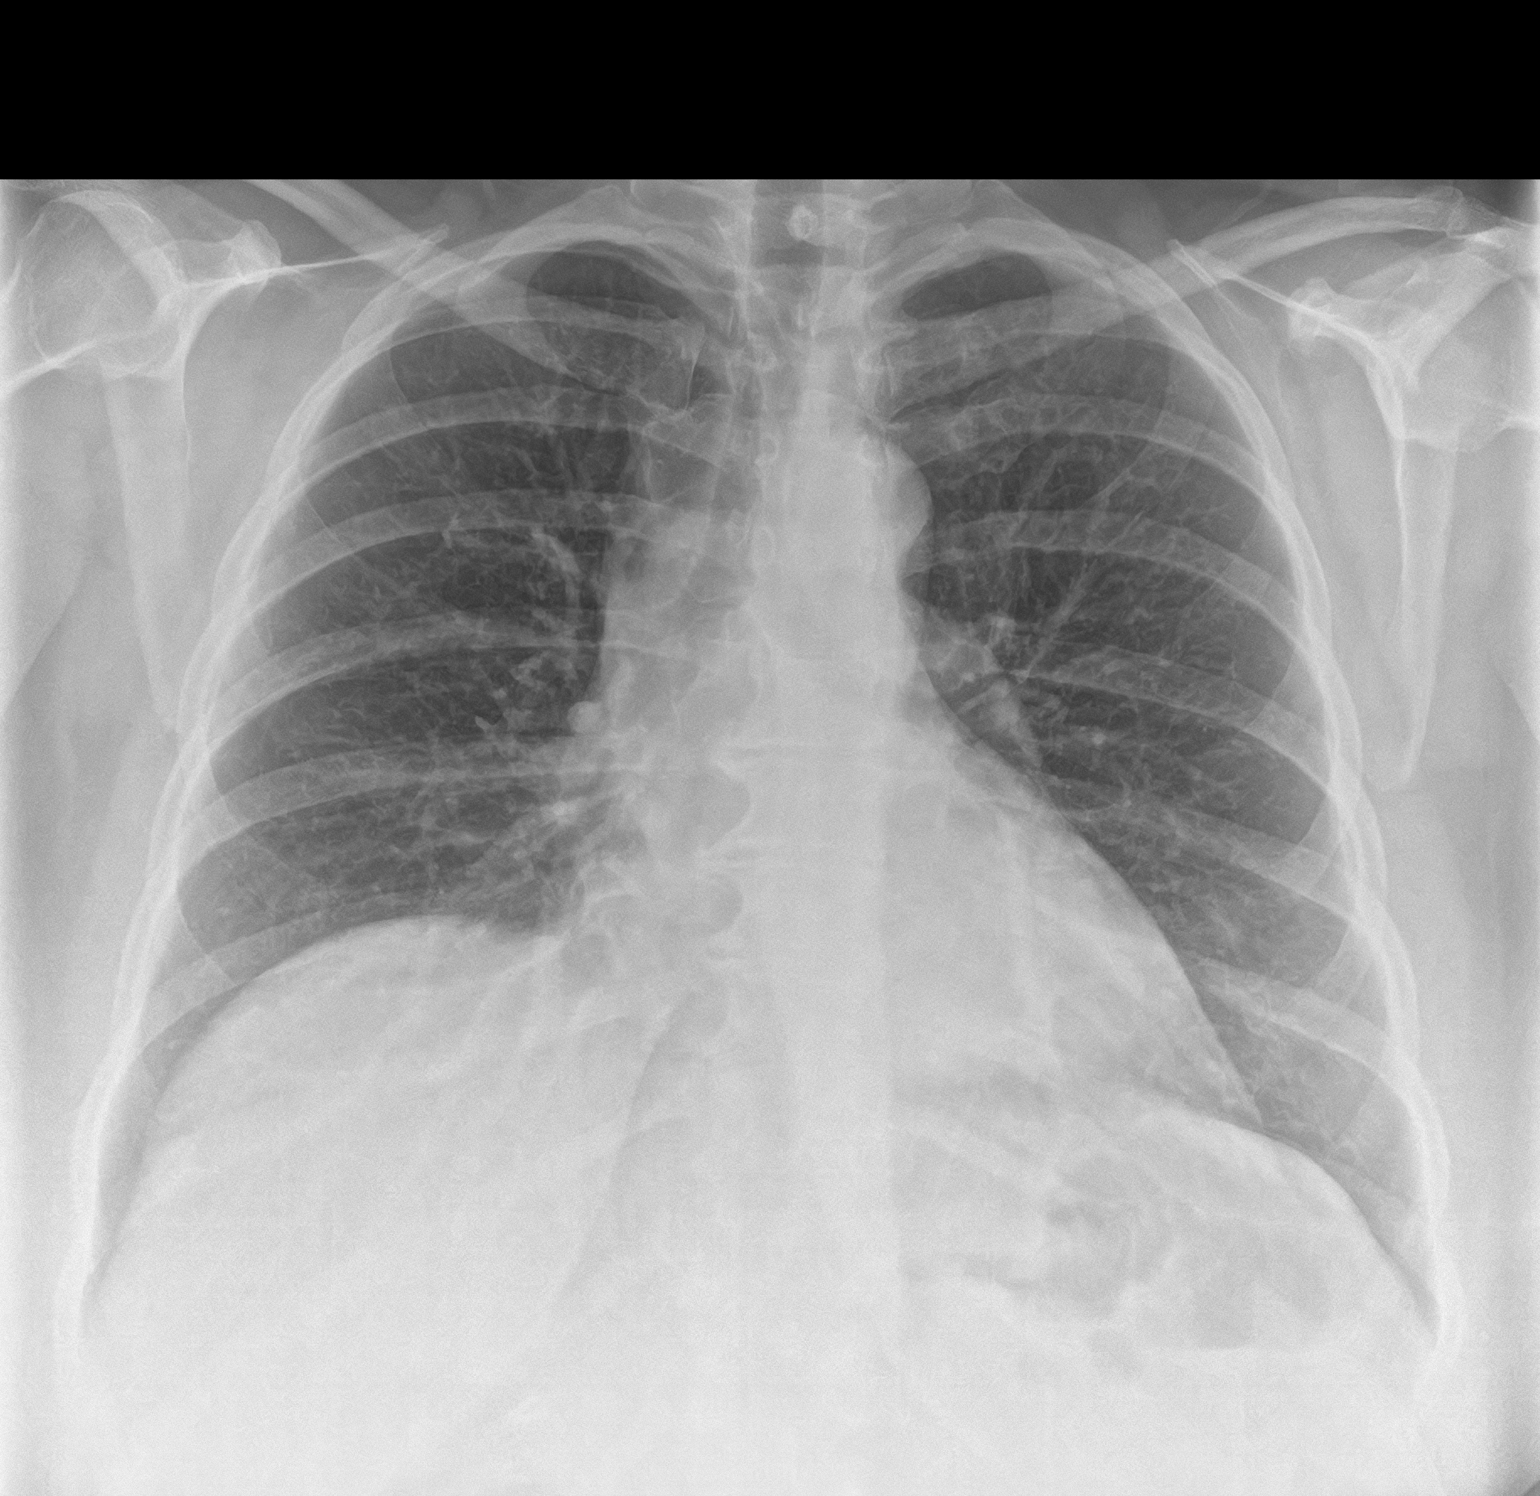
[im 2/2]
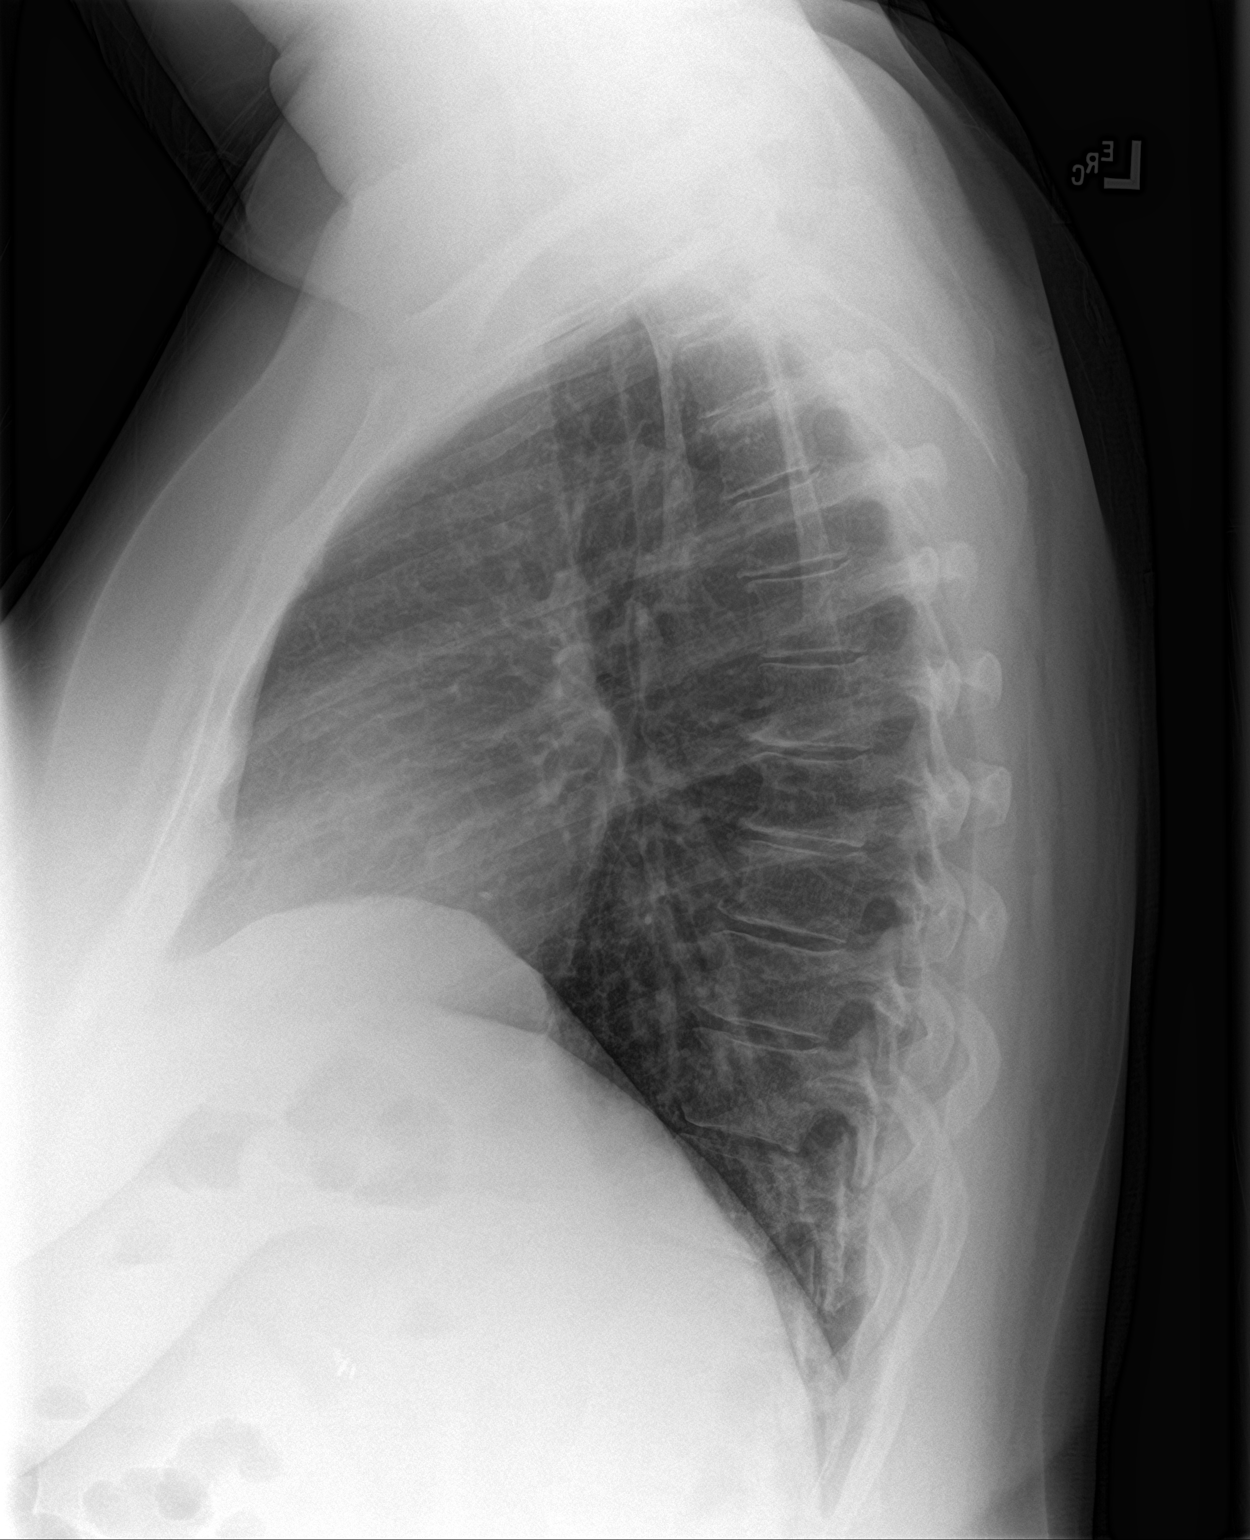

[2 of 2 positions shown; findings below may reference images not displayed]

FINDINGS: The heart size and mediastinal contours are within normal limits.
Both lungs are clear. The visualized skeletal structures are
unremarkable.
IMPRESSION: No active cardiopulmonary disease.

## 2021-12-11 ENCOUNTER — Encounter: Payer: Self-pay | Admitting: Gastroenterology

## 2022-01-09 ENCOUNTER — Emergency Department: Payer: 59

## 2022-01-09 ENCOUNTER — Other Ambulatory Visit: Payer: Self-pay

## 2022-01-09 ENCOUNTER — Emergency Department
Admission: EM | Admit: 2022-01-09 | Discharge: 2022-01-09 | Disposition: A | Discharge status: Home / Self Care | Payer: 59 | Attending: Emergency Medicine | Admitting: Emergency Medicine

## 2022-01-09 DIAGNOSIS — R7309 Other abnormal glucose: Secondary | ICD-10-CM | POA: Diagnosis not present

## 2022-01-09 DIAGNOSIS — F13239 Sedative, hypnotic or anxiolytic dependence with withdrawal, unspecified: Secondary | ICD-10-CM | POA: Diagnosis not present

## 2022-01-09 DIAGNOSIS — R42 Dizziness and giddiness: Secondary | ICD-10-CM | POA: Diagnosis not present

## 2022-01-09 DIAGNOSIS — R630 Anorexia: Secondary | ICD-10-CM | POA: Insufficient documentation

## 2022-01-09 DIAGNOSIS — F13930 Sedative, hypnotic or anxiolytic use, unspecified with withdrawal, uncomplicated: Secondary | ICD-10-CM

## 2022-01-09 DIAGNOSIS — F419 Anxiety disorder, unspecified: Secondary | ICD-10-CM | POA: Diagnosis not present

## 2022-01-09 DIAGNOSIS — E876 Hypokalemia: Secondary | ICD-10-CM | POA: Diagnosis not present

## 2022-01-09 DIAGNOSIS — R002 Palpitations: Secondary | ICD-10-CM | POA: Diagnosis present

## 2022-01-09 LAB — BASIC METABOLIC PANEL
Anion gap: 11 (ref 5–15)
BUN: 17 mg/dL (ref 6–20)
CO2: 24 mmol/L (ref 22–32)
Calcium: 9.1 mg/dL (ref 8.9–10.3)
Chloride: 102 mmol/L (ref 98–111)
Creatinine, Ser: 0.91 mg/dL (ref 0.44–1.00)
GFR, Estimated: >60 mL/min (ref >60)
Glucose, Bld: 129 mg/dL — ABNORMAL HIGH (ref 70–99)
Potassium: 3.7 mmol/L (ref 3.5–5.1)
Sodium: 137 mmol/L (ref 135–145)

## 2022-01-09 LAB — CBG MONITORING, ED: Glucose-Capillary: 136 mg/dL — ABNORMAL HIGH (ref 70–99)

## 2022-01-09 LAB — TROPONIN I (HIGH SENSITIVITY)
Troponin I (High Sensitivity): 3 ng/L (ref <18)
Troponin I (High Sensitivity): 3 ng/L (ref <18)

## 2022-01-09 LAB — CBC
HCT: 46.1 % — ABNORMAL HIGH (ref 36.0–46.0)
Hemoglobin: 15.9 g/dL — ABNORMAL HIGH (ref 12.0–15.0)
MCH: 30.5 pg (ref 26.0–34.0)
MCHC: 34.5 g/dL (ref 30.0–36.0)
MCV: 88.3 fL (ref 80.0–100.0)
Platelets: 272 10*3/uL (ref 150–400)
RBC: 5.22 MIL/uL — ABNORMAL HIGH (ref 3.87–5.11)
RDW: 11.8 % (ref 11.5–15.5)
WBC: 6.6 10*3/uL (ref 4.0–10.5)
nRBC: 0 % (ref 0.0–0.2)

## 2022-01-09 LAB — T4, FREE: Free T4: 0.78 ng/dL (ref 0.61–1.12)

## 2022-01-09 LAB — TSH: TSH: 1.317 u[IU]/mL (ref 0.350–4.500)

## 2022-01-09 LAB — MAGNESIUM: Magnesium: 2 mg/dL (ref 1.7–2.4)

## 2022-01-09 MED ORDER — CLONAZEPAM 0.5 MG PO TABS
1.0000 mg | ORAL_TABLET | Freq: Once | ORAL | Status: AC
  Administered 2022-01-09: 1 mg via ORAL
  Filled 2022-01-09: qty 2

## 2022-01-09 MED ORDER — CLONAZEPAM 1 MG PO TABS: 1.0000 mg | ORAL_TABLET | Freq: Every evening | ORAL | 0 refills | Status: AC | PRN

## 2022-01-09 MED ORDER — ALUM & MAG HYDROXIDE-SIMETH 200-200-20 MG/5ML PO SUSP
30.0000 mL | Freq: Once | ORAL | Status: AC
Start: 2022-01-09 — End: 2022-01-09
  Administered 2022-01-09: 30 mL via ORAL
  Filled 2022-01-09: qty 30

## 2022-01-09 MED ORDER — LIDOCAINE VISCOUS HCL 2 % MT SOLN
15.0000 mL | Freq: Once | OROMUCOSAL | Status: AC
  Administered 2022-01-09: 15 mL via ORAL
  Filled 2022-01-09: qty 15

## 2022-01-09 NOTE — ED Provider Notes (Signed)
[facility] Provider Note    Event Date/Time   First MD Initiated Contact with Patient 01/09/22 1500     (approximate)   History   Palpitations   HPI  Ashley Floyd is a 51 y.o. female   with anxiety who comes in with concerns for palpitations.  Patient reports that she has been retaking her fluid pill for a week and she takes her potassium may be low now.  She ran out of her anxiety medicine on the weekend as well.  She takes metoprolol for palpitations but is not been helping.  She last took it around 12:30 PM.  I asked patient further about her anxiety medications she is on Klonopin which she takes 1 mg every nighttime.  She has not taken it for 3 to 4 days.  Patient reports that she stopped taking it because she been having issues with her insurance company getting it filled.  She states that she has been on it for a very long period of time and is never gone without for a day.  Her symptoms started for about the past 48 hours where she felt palpitations, lightheadedness, sweating and just overall not feeling well.  Denies any shortness of breath, leg swelling.  Denies any urinary symptoms   Physical Exam   Triage Vital Signs: ED Triage Vitals [01/09/22 1441]  Enc Vitals Group     BP 122/89     Pulse Rate 88     Resp 18     Temp 98 F (36.7 C)     Temp Source Oral     SpO2 100 %     Weight 228 lb (103.4 kg)     Height $Remov'5\' 4"'LrtkBK$  (1.626 m)     Head Circumference      Peak Flow      Pain Score 0     Pain Loc      Pain Edu?      Excl. in [place]?     Most recent vital signs: Vitals:   01/09/22 1441  BP: 122/89  Pulse: 88  Resp: 18  Temp: 98 F (36.7 C)  SpO2: 100%     General: Awake, no distress. CV:  Good peripheral perfusion.  Resp:  Normal effort.  Abd:  No distention.  Soft and nontender Other:  No leg swelling noted.  Nontender calves   ED Results / Procedures / Treatments   Labs (all labs ordered are listed, but only abnormal  results are displayed) Labs Reviewed  BASIC METABOLIC PANEL - Abnormal; Notable for the following components:      Result Value   Glucose, Bld 129 (*)    All other components within normal limits  CBC - Abnormal; Notable for the following components:   RBC 5.22 (*)    Hemoglobin 15.9 (*)    HCT 46.1 (*)    All other components within normal limits  CBG MONITORING, ED - Abnormal; Notable for the following components:   Glucose-Capillary 136 (*)    All other components within normal limits  TSH  T4, FREE  MAGNESIUM  URINALYSIS, ROUTINE W REFLEX MICROSCOPIC  POC URINE PREG, ED  TROPONIN I (HIGH SENSITIVITY)  TROPONIN I (HIGH SENSITIVITY)     EKG  My interpretation of EKG:  Normal sinus rhythm 91 without any ST elevation, T wave inversion in lead III, normal intervals   I personally reviewed her prior EKG which looks very similar   RADIOLOGY I have reviewed the  xray personally and it appears normal   PROCEDURES:  Critical Care performed: No  .1-3 Lead EKG Interpretation Performed by: Vanessa Barnwell, MD Authorized by: Vanessa Stanley, MD     Interpretation: normal     ECG rate:  80   ECG rate assessment: normal     Rhythm: sinus rhythm     Ectopy: none     Conduction: normal     MEDICATIONS ORDERED IN ED: Medications  clonazePAM (KLONOPIN) tablet 1 mg (has no administration in time range)     IMPRESSION / MDM / ASSESSMENT AND PLAN / ED COURSE  I reviewed the triage vital signs and the nursing notes.                              Patient with known anxiety, palpitations who comes in with concerns for palpitations.  Patient reported that she was currently feeling the palpitations.  I reviewed the cardiac monitor and she is in normal sinus.  Given patient had abrupt cessation of her benzodiazepine I suspect that this is most likely benzodiazepine withdrawal.  I did review the database and patient does have consistent fills for this.  She has been trying to contact  her primary care doctor to figure out what is been going on this but has met a lot of resistance.  Labs ordered to make sure no evidence of thyroid dysfunction, Electra abnormalities, AKI, chest x-ray to evaluate for pneumonia, pneumothorax.  We will give 1 dose of Klonopin here.  BMP shows normal kidney function CBC shows no anemia.  Slightly elevated hemoglobin which I did discuss with patient Thyroid testing is normal Troponin is negative Electrolytes are normal  Patient feeling better after the Klonopin but stated that when she went to try to swallow the pill that she had a little bit of discomfort with swallowing.  She does report a history of acid reflux and feels like it is related to that but will get repeat EKG and repeat troponin just to ensure not cardiac in nature  Repeat EKG my interpretation is sinus rate of 80 without any ST elevation, T wave version in lead III, normal intervals.  Unchanged from prior  Repeat troponin is negative.  Reevaluated patient patient feeling better after GI cocktail.  She feels comfortable with discharge home and will return if symptoms are worsening.  We will give her a short prescription of Klonopin to get her follow-up with her primary care doctor  The patient is on the cardiac monitor to evaluate for evidence of arrhythmia and/or significant heart rate changes.  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   Vanessa [place], MD 01/09/22 [phone]

## 2022-01-09 NOTE — ED Notes (Signed)
This RN informed pt of need for urine sample, they stated they peed before they were triaged and cannot use the bathroom right now. Stated they will call out when they think they can

## 2022-01-09 NOTE — ED Notes (Signed)
Pt discharge information reviewed. Pt understands need for follow up care and when to return if symptoms worsen. All questions answered. Pt is alert and oriented with even and regular respirations. Pt is seen ambulating out of department with string steady gait.   

## 2022-01-09 NOTE — ED Triage Notes (Signed)
Pt comes pov with husband from dentist for heart palpitations. Started retaking her fluid pill over the weekend and thinks her potassium may be low. States she also ran out of anxiety medication over the weekend. Takes metoprolol for palpitations but hasn't helped this time. Last dose was 1230.

## 2022-04-17 ENCOUNTER — Ambulatory Visit
Admission: RE | Admit: 2022-04-17 | Discharge: 2022-04-17 | Disposition: A | Discharge status: Home / Self Care | Payer: 59 | Source: Ambulatory Visit | Attending: Emergency Medicine | Admitting: Emergency Medicine

## 2022-04-17 VITALS — BP 144/112 | HR 117 | Temp 98.0°F | Resp 18

## 2022-04-17 DIAGNOSIS — L03011 Cellulitis of right finger: Secondary | ICD-10-CM

## 2022-04-17 MED ORDER — DOXYCYCLINE HYCLATE 100 MG PO CAPS: 100.0000 mg | ORAL_CAPSULE | Freq: Two times a day (BID) | ORAL | 0 refills | Status: DC

## 2022-04-17 NOTE — ED Triage Notes (Signed)
Pt also reports last week she hit her finger on the wall, she is concerned since she had an episode of numbness to finger.

## 2022-04-17 NOTE — Discharge Instructions (Signed)
Take the Doxycycline twice daily with food for 10 days.  Soak your finger in warm water and Epsom salts 2-3 times a day to help facilitate drainage.  You can use over-the-counter Tylenol and ibuprofen according to the package instructions as needed for pain.  Return for reevaluation if you develop any increased redness, swelling, drainage, or red streaks going up your finger, or fever.

## 2022-04-17 NOTE — ED Triage Notes (Signed)
Patient presents to Urgent Care with complaints of finger problem. She states she had a hang nail to her right ring finger that she pulled about 3-4 weeks ago. Pt states she finger is sore, redness and she's unsure if infected now. Treating with peroxide and neosporin.   Denies fever.

## 2022-04-17 NOTE — ED Provider Notes (Signed)
[facility]    CSN: [id number] Arrival date & time: 04/17/22  1209      History   Chief Complaint Chief Complaint  Patient presents with   Finger Injury    Pulled hang nail 3-4 weeks ago, but seemed to heal. Then traveled to [place], swam in pool & went snorkeling in the ocean 3 wks ago.  This week, finger has been sore. Peroxide/Neosporin are not resolving. Today, I have alot of swelling & a red ring. - Entered by patient    HPI Ashley Floyd is a 51 y.o. female.   HPI  51 year old female here for evaluation of skin complaint.  Patient reports that yesterday she developed redness and swelling around the cuticle of her right ring finger.  She states that 3 to 4 weeks ago she pulled a hangnail off in that area and thinks that may have been what precipitated this.  She states she has been able to express a small amount of white drainage from the area.  She is complaining of pain at the edge of her finger and in the pad as well.  No red streaks going up her arm and no fever.  Past Medical History:  Diagnosis Date   Anxiety    a. per epic care everywhere & patient   Arthritis    Toes, knees   Bipolar affective disorder ([place])    Complication of anesthesia    PT STATES SHE HAS A SMALL MOUTH AND HAS HAD DENTAL WORK ON HER 2 FRONT TEETH AND SHE WANTS EXTRA CARE TAKEN FOR HER TEETH   Depression    Dysmenorrhea    Endometriosis    Family history of adverse reaction to anesthesia    PTS MOM HAS TROUBLE WITH ANESTHESIA DUE TO HER SMOKING HISTORY   GERD (gastroesophageal reflux disease)    Headache    sinus/stress/BP   Hemorrhoid    Herpes genitalis    History of mammogram 03/22/2015   BI-RADS 1 AT [facility]   History of nuclear stress test    a. 08/2015: no EKG changes concerning for ischemia, normal wall motion, EF >60%, low risk study   History of Papanicolaou smear of cervix 03/08/2015   -/-   Hypertension    IBS (irritable bowel syndrome)    Infertility, female     [facility] PT IN THE PAST   Lichen sclerosus    Obesity    Ovarian cyst 1998   right   PMDD (premenstrual dysphoric disorder)    PONV (postoperative nausea and vomiting)    Shortness of breath dyspnea    Sinus infection    Sleep apnea    "MILD"-NO CPAP-LAST SLEEP STUDY YEARS AGO   Vertigo    rare    Patient Active Problem List   Diagnosis Date Noted   Personal history of colonic polyps    Tinea corporis 12/26/2020   Hematochezia 10/28/2020   H/O colonoscopy with polypectomy 10/26/20 10/28/2020   Colon cancer screening    Polyp of ascending colon    Perimenopausal vasomotor symptoms 06/08/2020   Strain of lumbar region 08/17/2019   Perimenopause 05/03/2019   Allergic rhinitis 05/15/2017   Endometriosis 05/15/2017   Acid reflux 05/15/2017   Brash 05/15/2017   Adaptive colitis 05/15/2017   Cannot sleep 90/24/0973   Lichen sclerosus 53/29/9242   Genital herpes 03/24/2017   Preoperative cardiovascular examination 10/26/2015   Gallstones 10/23/2015   Opacity of lung on imaging study 10/16/2015   Epigastric pain 10/04/2015  Atypical chest pain 09/22/2015   Anxiety    IBS (irritable bowel syndrome) 07/26/2015   Obesity, Class II, BMI 35-39.9 07/26/2015   Pain in the chest 03/22/2015   SOB (shortness of breath) on exertion 03/22/2015   Bilateral leg edema 03/22/2015   Obesity, Class III, BMI 40-49.9 (morbid obesity) ([place]) 03/22/2015   Clinical depression 03/22/2015   Affective bipolar disorder ([place]) 03/22/2015   Essential hypertension 03/22/2015    Past Surgical History:  Procedure Laterality Date   CHOLECYSTECTOMY N/A 12/07/2015   Procedure: LAPAROSCOPIC CHOLECYSTECTOMY ;  Surgeon: Robert Bellow, MD;  Location: [facility];  Service: General;  Laterality: N/A;   COLONOSCOPY WITH PROPOFOL N/A 10/27/2020   Procedure: COLONOSCOPY WITH BIOPSY;  Surgeon: [name], MD;  Location: [place];  Service: Endoscopy;  Laterality: N/A;  sleep apnea   COLONOSCOPY WITH  PROPOFOL N/A 11/30/2021   Procedure: COLONOSCOPY WITH PROPOFOL;  Surgeon: [name], MD;  Location: [place];  Service: Endoscopy;  Laterality: N/A;   DIAGNOSTIC LAPAROSCOPY  1998; 2006   OSIS; OVARIAN CYST (2006 AT [facility])   OVARIAN CYST REMOVAL     POLYPECTOMY N/A 10/27/2020   Procedure: POLYPECTOMY;  Surgeon: [name], MD;  Location: [place];  Service: Endoscopy;  Laterality: N/A;  Clip x 2 placed at [place] Polyp Removal   POLYPECTOMY N/A 11/30/2021   Procedure: POLYPECTOMY;  Surgeon: [name], MD;  Location: [place];  Service: Endoscopy;  Laterality: N/A;   UPPER GI ENDOSCOPY  2015   Dr Allen Norris   URETHRAL DILATION      OB History     Gravida  1   Para  0   Term  0   Preterm  0   AB  1   Living  0      SAB  0   IAB  0   Ectopic  0   Multiple  0   Live Births           Obstetric Comments  1st Menstrual Cycle:  10           Home Medications    Prior to Admission medications   Medication Sig Start Date End Date Taking? Authorizing Provider  doxycycline (VIBRAMYCIN) 100 MG capsule Take 1 capsule (100 mg total) by mouth 2 (two) times daily. 04/17/22  Yes Margarette Canada, NP  ALREX 0.2 % SUSP USE 1 DROP(S) IN BOTH EYES 4 TIMES A DAY AS NEEDED [PRN] 11/28/18   [provider]  carbamazepine (TEGRETOL) 200 MG tablet Take 800 mg by mouth at bedtime.    [provider]  cholestyramine (QUESTRAN) 4 g packet TAKE 1 PACKET (4 G TOTAL) BY MOUTH 4 (FOUR) TIMES DAILY. Patient not taking: Reported on 11/29/2021 10/25/21   [name], MD  clobetasol ointment (TEMOVATE) 0.05 % Apply to affected area 2-3 times wkly as maintenance 6/96/29   Copland, Alicia B, PA-C  clonazePAM (KLONOPIN) 1 MG tablet Take 3 mg by mouth at bedtime.  01/30/15   [provider]  clonazePAM (KLONOPIN) 1 MG tablet Take 1 tablet (1 mg total) by mouth at bedtime as needed for up to 5 days for anxiety. 01/09/22 01/14/22  Vanessa Parcelas Penuelas, MD   clotrimazole [facility]) 10 MG troche  06/07/21   [provider]  cyclobenzaprine (FLEXERIL) 10 MG tablet Take 10 mg by mouth 2 (two) times daily as needed. 12/01/20   [provider]  desonide (DESOWEN) 0.05 % cream Apply topically 2 (two)  times daily. 7/32/20   Copland, Deirdre Evener, PA-C  dexlansoprazole (DEXILANT) 60 MG capsule Take 1 capsule (60 mg total) by mouth daily. 09/13/21   [name], MD  diphenhydrAMINE (BENADRYL) 25 MG tablet Take 25 mg by mouth at bedtime as needed. Reported on 05/23/2016    [provider]  hydrALAZINE (APRESOLINE) 25 MG tablet Take 1 tablet (25 mg total) by mouth 2 (two) times daily as needed (high pressure, headache). 05/30/20   Minna Merritts, MD  losartan-hydrochlorothiazide (HYZAAR) 50-12.5 MG tablet Take 1 tablet by mouth daily. Patient not taking: Reported on 11/29/2021 05/30/20   Minna Merritts, MD  metoprolol tartrate (LOPRESSOR) 25 MG tablet Take 1 tablet (25 mg total) by mouth 2 (two) times daily as needed (tachycardia). 05/30/20   Minna Merritts, MD  naproxen (NAPROSYN) 500 MG tablet Take 500 mg by mouth 2 (two) times daily as needed.    [provider]  nystatin (MYCOSTATIN) 100000 UNIT/ML suspension Take by mouth. 06/07/21   [provider]  olopatadine (PATANOL) 0.1 % ophthalmic solution Place 1 drop into both eyes 4 (four) times daily as needed.  10/02/20   [provider]  Sod Picosulfate-Mag Ox-Cit Acd (CLENPIQ) 10-3.5-12 MG-GM -GM/160ML SOLN Take 320 mLs by mouth as directed. 09/13/21   [name], MD  terconazole (TERAZOL 7) 0.4 % vaginal cream AAA BID prn sx 2/54/27   Copland, Alicia B, PA-C  triamcinolone (NASACORT) 55 MCG/ACT AERO nasal inhaler Place 2 sprays into the nose daily. Reported on 05/23/2016    [provider]  valACYclovir (VALTREX) 500 MG tablet TAKE 1 TABLET BY MOUTH 2  TIMES DAILY FOR 3 DAYS AS  NEEDED FOR SYMPTOMS 0/62/37   Copland, Elmo Putt B, PA-C  zaleplon (SONATA) 10  MG capsule Take 10 mg by mouth daily.    [provider]    Family History Family History  Problem Relation Age of Onset   Arrhythmia Mother    Cancer Mother        MELANOMA OF SKIN   Broken bones Mother        neck   Hypertension Maternal Grandmother    Hypertension Maternal Grandfather    Heart disease Paternal Grandmother    Heart attack Paternal Grandmother    Diabetes Paternal Grandmother    Diabetes Father    Cancer Paternal Grandfather        MAL NEO OF SKIN; LUNGS   Heart failure Maternal Aunt    Heart disease Maternal Aunt        CABG   Diabetes Paternal Aunt    Diabetes Other     Social History Social History   Tobacco Use   Smoking status: Never   Smokeless tobacco: Never  Vaping Use   Vaping Use: Never used  Substance Use Topics   Alcohol use: Not Currently    Alcohol/week: 0.0 standard drinks    Comment: rarely   Drug use: No     Allergies   Cephalosporins, Progesterone, Adhesive [tape], Cefdinir, and Penicillins   Review of Systems Review of Systems  Constitutional:  Negative for fever.  Musculoskeletal:  Negative for arthralgias and joint swelling.  Skin:  Positive for color change. Negative for wound.  Hematological: Negative.   Psychiatric/Behavioral: Negative.      Physical Exam Triage Vital Signs ED Triage Vitals  Enc Vitals Group     BP 04/17/22 1223 (S) (!) 144/112     Pulse Rate 04/17/22 1223 (S) (!) 117  Resp 04/17/22 1223 18     Temp 04/17/22 1223 98 F (36.7 C)     Temp Source 04/17/22 1223 Oral     SpO2 04/17/22 1223 98 %     Weight --      Height --      Head Circumference --      Peak Flow --      Pain Score 04/17/22 1220 3     Pain Loc --      Pain Edu? --      Excl. in [place]? --    No data found.  Updated Vital Signs BP (S) (!) 144/112 (BP Location: Left Arm)   Pulse (S) (!) 117 Comment: not taking her bp meds  Temp 98 F (36.7 C) (Oral)   Resp 18   LMP 11/15/2019   SpO2 98%   Visual  Acuity Right Eye Distance:   Left Eye Distance:   Bilateral Distance:    Right Eye Near:   Left Eye Near:    Bilateral Near:     Physical Exam Vitals and nursing note reviewed.  Constitutional:      Appearance: Normal appearance. She is not ill-appearing.  HENT:     Head: Normocephalic and atraumatic.  Musculoskeletal:        General: Swelling and tenderness present. No deformity or signs of injury. Normal range of motion.  Skin:    Capillary Refill: Capillary refill takes less than 2 seconds.     Findings: Erythema present. No lesion.  Neurological:     General: No focal deficit present.     Mental Status: She is alert and oriented to person, place, and time.  Psychiatric:        Mood and Affect: Mood normal.        Behavior: Behavior normal.        Thought Content: Thought content normal.        Judgment: Judgment normal.     UC Treatments / Results  Labs (all labs ordered are listed, but only abnormal results are displayed) Labs Reviewed - No data to display  EKG   Radiology No results found.  Procedures Procedures (including critical care time)  Medications Ordered in UC Medications - No data to display  Initial Impression / Assessment and Plan / UC Course  I have reviewed the triage vital signs and the nursing notes.  Pertinent labs & imaging results that were available during my care of the patient were reviewed by me and considered in my medical decision making (see chart for details).  Patient is a very pleasant, nontoxic-appearing 51 year old female here for evaluation of redness and swelling that started yesterday around the cuticle of her right ring finger.  On exam patient does have erythema and mild induration to the lateral edge of the cuticle to her right fourth fingernail.  It does not extend back to the level of the IP joint.  The redness does extend anteriorly around to the pad of her finger.  There is no fluctuance or induration noted indicates  she may be developing a felon.  Her exam is more consistent with a paronychia.  There is no appreciable drainage noted and there is no torn or damaged cuticle.  I will treat the patient with doxycycline twice daily for 10 days and have her soak her finger in warm water and Epsom salts 2-3 times a day to try and resolve the infection.  If she has increase in swelling, redness, or pain she  is to return for reevaluation.  At this point time there is no fluid collection that would indicate an I&D is necessary.   Final Clinical Impressions(s) / UC Diagnoses   Final diagnoses:  Paronychia of finger of right hand     Discharge Instructions      Take the Doxycycline twice daily with food for 10 days.  Soak your finger in warm water and Epsom salts 2-3 times a day to help facilitate drainage.  You can use over-the-counter Tylenol and ibuprofen according to the package instructions as needed for pain.  Return for reevaluation if you develop any increased redness, swelling, drainage, or red streaks going up your finger, or fever.      ED Prescriptions     Medication Sig Dispense Auth. Provider   doxycycline (VIBRAMYCIN) 100 MG capsule Take 1 capsule (100 mg total) by mouth 2 (two) times daily. 20 capsule Margarette Canada, NP      PDMP not reviewed this encounter.   Margarette Canada, NP 04/17/22 1240

## 2022-05-06 ENCOUNTER — Telehealth: Payer: Self-pay | Admitting: Cardiovascular Disease

## 2022-05-06 NOTE — Telephone Encounter (Signed)
3 attempts to schedule fu appt from recall list.   Deleting recall.   

## 2022-07-02 ENCOUNTER — Encounter: Payer: Self-pay | Admitting: Gastroenterology

## 2022-07-04 NOTE — Telephone Encounter (Signed)
DECISION: Approved DRUG DexilantCap'60mg'$ Dr NAME: Ashley Floyd [name] NAME: [place]:[name] NEXT Youcannowfillyourprescriptionforthismedication. STEPS: VALID 07/03/2022 07/04/2023

## 2022-07-30 ENCOUNTER — Ambulatory Visit (INDEPENDENT_AMBULATORY_CARE_PROVIDER_SITE_OTHER): Payer: 59 | Admitting: Obstetrics and Gynecology

## 2022-07-30 ENCOUNTER — Encounter: Payer: Self-pay | Admitting: Obstetrics and Gynecology

## 2022-07-30 VITALS — BP 140/98 | Ht 64.0 in | Wt 248.0 lb

## 2022-07-30 DIAGNOSIS — Z124 Encounter for screening for malignant neoplasm of cervix: Secondary | ICD-10-CM

## 2022-07-30 DIAGNOSIS — B354 Tinea corporis: Secondary | ICD-10-CM

## 2022-07-30 DIAGNOSIS — Z1231 Encounter for screening mammogram for malignant neoplasm of breast: Secondary | ICD-10-CM | POA: Diagnosis not present

## 2022-07-30 DIAGNOSIS — L900 Lichen sclerosus et atrophicus: Secondary | ICD-10-CM

## 2022-07-30 DIAGNOSIS — Z1151 Encounter for screening for human papillomavirus (HPV): Secondary | ICD-10-CM

## 2022-07-30 DIAGNOSIS — A6004 Herpesviral vulvovaginitis: Secondary | ICD-10-CM

## 2022-07-30 DIAGNOSIS — Z1211 Encounter for screening for malignant neoplasm of colon: Secondary | ICD-10-CM

## 2022-07-30 DIAGNOSIS — Z01419 Encounter for gynecological examination (general) (routine) without abnormal findings: Secondary | ICD-10-CM | POA: Diagnosis not present

## 2022-07-30 MED ORDER — TERCONAZOLE 0.4 % VA CREA: TOPICAL_CREAM | VAGINAL | 1 refills | Status: DC

## 2022-07-30 MED ORDER — CLOBETASOL PROPIONATE 0.05 % EX OINT: TOPICAL_OINTMENT | CUTANEOUS | 1 refills | Status: DC

## 2022-07-30 MED ORDER — DESONIDE 0.05 % EX CREA: TOPICAL_CREAM | Freq: Two times a day (BID) | CUTANEOUS | 1 refills | Status: DC

## 2022-07-30 NOTE — Patient Instructions (Signed)
I value your feedback and you entrusting us with your care. If you get a [facility] patient survey, I would appreciate you taking the time to let us know about your experience today. Thank you!   [facility]: [phone]  

## 2022-07-30 NOTE — Progress Notes (Signed)
Chief Complaint  Patient presents with   Gynecologic Exam    Menopause qs     HPI:      [name] is a 51 y.o. G1P0010 who LMP was Patient's last menstrual period was 11/15/2019., presents today for her annual examination. Her menses are absent now since 12/20. No PMB. She has a hx of endometriosis and ovarian cysts. She has vasomotor sx that are improved but still feels hot in general.   Pt with hx of lichen sclerosis sx and saw derm. Treats with clobetasol oint prn sx, usually for a few days at a time, a few times a yr. Has had increased sx recently. Pt needs Rx RF. Also treats with terconazole crm and desonide for tinea under bilat breasts, inguinal areas, panus. Tried to keep dry but doesn't like powders.  Needs Rx RF.   Sex activity: not sex active; hx of infertility. Last Pap: 04/07/18  Results were: no abnormalities /neg HPV DNA  Hx of STDs: HSV, oral and genital and takes valtrex a few times a year prn sx. Doesn't need RF today.   Last mammogram: 09/13/21 at [facility].  Results were: normal--routine follow-up in 12 months There is no FH of breast cancer. There is no FH of ovarian cancer. The patient does not do self-breast exams.   Tobacco use: The patient denies current or previous tobacco use. Alcohol use: none No drug use Exercise: not active; hx of arthritis in toe that is very painful with exercise  Colonoscopy: 1/23 with Dr. Allen Norris with polyps, repeat due after 1 yr She was seeing GI for IBS-D and wt loss in 2021. Had colonoscopy 12/21 with precancerousp polyps; repeat due after 6 months with Dr. Allen Norris. Has noticed rectal bulge, particularly with BM since colonoscopy. Was diagnosed with internal hemorrhoids. Hx of constipation/diarrhea/IBS.   She does not get adequate calcium or Vitamin D supp.  Labs with psych and cardio MD. She sees cardio, Dr. Rockey Situ, for HTN.   Has been under increased fam stress and not able to exercise. Not sleeping well, wakes in night hungry  so eating more/gaining wt. Pt has meds but tired of taking for anxiety/sleep and they're not working.     Past Medical History:  Diagnosis Date   Anxiety    a. per epic care everywhere & patient   Arthritis    Toes, knees   Bipolar affective disorder ([place])    Complication of anesthesia    PT STATES SHE HAS A SMALL MOUTH AND HAS HAD DENTAL WORK ON HER 2 FRONT TEETH AND SHE WANTS EXTRA CARE TAKEN FOR HER TEETH   Depression    Dysmenorrhea    Endometriosis    Family history of adverse reaction to anesthesia    PTS MOM HAS TROUBLE WITH ANESTHESIA DUE TO HER SMOKING HISTORY   GERD (gastroesophageal reflux disease)    Headache    sinus/stress/BP   Hemorrhoid    Herpes genitalis    History of mammogram 03/22/2015   BI-RADS 1 AT [facility]   History of nuclear stress test    a. 08/2015: no EKG changes concerning for ischemia, normal wall motion, EF >60%, low risk study   History of Papanicolaou smear of cervix 03/08/2015   -/-   Hypertension    IBS (irritable bowel syndrome)    Infertility, female    [facility] PT IN THE PAST   Lichen sclerosus    Obesity    Ovarian cyst 1998   right  PMDD (premenstrual dysphoric disorder)    PONV (postoperative nausea and vomiting)    Shortness of breath dyspnea    Sinus infection    Sleep apnea    "MILD"-NO CPAP-LAST SLEEP STUDY YEARS AGO   Vertigo    rare    Past Surgical History:  Procedure Laterality Date   CHOLECYSTECTOMY N/A 12/07/2015   Procedure: LAPAROSCOPIC CHOLECYSTECTOMY ;  Surgeon: Robert Bellow, MD;  Location: [facility];  Service: General;  Laterality: N/A;   COLONOSCOPY WITH PROPOFOL N/A 10/27/2020   Procedure: COLONOSCOPY WITH BIOPSY;  Surgeon: [name], MD;  Location: [place];  Service: Endoscopy;  Laterality: N/A;  sleep apnea   COLONOSCOPY WITH PROPOFOL N/A 11/30/2021   Procedure: COLONOSCOPY WITH PROPOFOL;  Surgeon: [name], MD;  Location: [place];  Service: Endoscopy;  Laterality: N/A;    DIAGNOSTIC LAPAROSCOPY  1998; 2006   OSIS; OVARIAN CYST (2006 AT [facility])   OVARIAN CYST REMOVAL     POLYPECTOMY N/A 10/27/2020   Procedure: POLYPECTOMY;  Surgeon: [name], MD;  Location: [place];  Service: Endoscopy;  Laterality: N/A;  Clip x 2 placed at [facility] Polyp Removal   POLYPECTOMY N/A 11/30/2021   Procedure: POLYPECTOMY;  Surgeon: [name], MD;  Location: [place];  Service: Endoscopy;  Laterality: N/A;   UPPER GI ENDOSCOPY  2015   Dr Allen Norris   URETHRAL DILATION      Family History  Problem Relation Age of Onset   Arrhythmia Mother    Cancer Mother        MELANOMA OF SKIN   Broken bones Mother        neck   Hypertension Maternal Grandmother    Hypertension Maternal Grandfather    Heart disease Paternal Grandmother    Heart attack Paternal Grandmother    Diabetes Paternal Grandmother    Diabetes Father    Cancer Paternal Grandfather        MAL NEO OF SKIN; LUNGS   Heart failure Maternal Aunt    Heart disease Maternal Aunt        CABG   Diabetes Paternal Aunt    Diabetes Other     Social History   Socioeconomic History   Marital status: Married    Spouse name: Not on file   Number of children: 0   Years of education: 16   Highest education level: Not on file  Occupational History   Not on file  Tobacco Use   Smoking status: Never   Smokeless tobacco: Never  Vaping Use   Vaping Use: Never used  Substance and Sexual Activity   Alcohol use: Not Currently    Alcohol/week: 0.0 standard drinks of alcohol    Comment: rarely   Drug use: No   Sexual activity: Not Currently    Birth control/protection: Post-menopausal  Other Topics Concern   Not on file  Social History Narrative   Not on file   Social Determinants of Health   Financial Resource Strain: Not on file  Food Insecurity: Not on file  Transportation Needs: Not on file  Physical Activity: Not on file  Stress: Not on file  Social Connections: Not on file  Intimate Partner  Violence: Not on file    Current Outpatient Medications on File Prior to Visit  Medication Sig Dispense Refill   ALREX 0.2 % SUSP USE 1 DROP(S) IN BOTH EYES 4 TIMES A DAY AS NEEDED [PRN]     carbamazepine (TEGRETOL) 200 MG tablet Take 800  mg by mouth at bedtime.     clotrimazole (MYCELEX) 10 MG troche      cyclobenzaprine (FLEXERIL) 10 MG tablet Take 10 mg by mouth 2 (two) times daily as needed.     dexlansoprazole (DEXILANT) 60 MG capsule Take 1 capsule (60 mg total) by mouth daily. 90 capsule 3   diphenhydrAMINE (BENADRYL) 25 MG tablet Take 25 mg by mouth at bedtime as needed. Reported on 05/23/2016     hydrALAZINE (APRESOLINE) 25 MG tablet Take 1 tablet (25 mg total) by mouth 2 (two) times daily as needed (high pressure, headache). 180 tablet 3   losartan-hydrochlorothiazide (HYZAAR) 50-12.5 MG tablet Take 1 tablet by mouth daily. 90 tablet 3   metoprolol tartrate (LOPRESSOR) 25 MG tablet Take 1 tablet (25 mg total) by mouth 2 (two) times daily as needed (tachycardia). 180 tablet 3   naproxen (NAPROSYN) 500 MG tablet Take 500 mg by mouth 2 (two) times daily as needed.     nystatin (MYCOSTATIN) 100000 UNIT/ML suspension Take by mouth.     olopatadine (PATANOL) 0.1 % ophthalmic solution Place 1 drop into both eyes 4 (four) times daily as needed.      triamcinolone (NASACORT) 55 MCG/ACT AERO nasal inhaler Place 2 sprays into the nose daily. Reported on 05/23/2016     valACYclovir (VALTREX) 500 MG tablet TAKE 1 TABLET BY MOUTH 2  TIMES DAILY FOR 3 DAYS AS  NEEDED FOR SYMPTOMS 30 tablet 0   zaleplon (SONATA) 10 MG capsule Take 10 mg by mouth daily.     clonazePAM (KLONOPIN) 1 MG tablet Take 1 tablet (1 mg total) by mouth at bedtime as needed for up to 5 days for anxiety. 5 tablet 0   No current facility-administered medications on file prior to visit.      ROS:  Review of Systems  Constitutional:  Positive for fatigue. Negative for fever and unexpected weight change.  Respiratory:   Negative for cough, shortness of breath and wheezing.   Cardiovascular:  Negative for chest pain, palpitations and leg swelling.  Gastrointestinal:  Positive for constipation and diarrhea. Negative for blood in stool, nausea and vomiting.  Endocrine: Negative for cold intolerance, heat intolerance and polyuria.  Genitourinary:  Negative for dyspareunia, dysuria, flank pain, frequency, genital sores, hematuria, menstrual problem, pelvic pain, urgency, vaginal bleeding, vaginal discharge and vaginal pain.  Musculoskeletal:  Positive for arthralgias. Negative for back pain, joint swelling and myalgias.  Skin:  Positive for rash.  Neurological:  Positive for headaches. Negative for dizziness, syncope, light-headedness and numbness.  Hematological:  Negative for adenopathy.  Psychiatric/Behavioral:  Positive for agitation and dysphoric mood. Negative for confusion, sleep disturbance and suicidal ideas. The patient is not nervous/anxious.      Objective: Ht '5\' 4"'$  (1.626 m)   Wt 248 lb (112.5 kg)   LMP 11/15/2019   BMI 42.57 kg/m    Physical Exam Constitutional:      Appearance: She is well-developed.  Genitourinary:     Vulva normal.     Right Labia: tenderness.     Right Labia: No rash or lesions.    Left Labia: tenderness.     Left Labia: No lesions or rash.       No vaginal discharge, erythema or tenderness.      Right Adnexa: not tender and no mass present.    Left Adnexa: not tender and no mass present.    No cervical friability or polyp.     Uterus is not enlarged or tender.  Rectum:     Guaiac result negative.     External hemorrhoid present.     No tenderness.  Breasts:    Right: No mass, nipple discharge, skin change or tenderness.     Left: No mass, nipple discharge, skin change or tenderness.  Neck:     Thyroid: No thyromegaly.  Cardiovascular:     Rate and Rhythm: Normal rate and regular rhythm.     Heart sounds: Normal heart sounds. No murmur  heard. Pulmonary:     Effort: Pulmonary effort is normal.     Breath sounds: Normal breath sounds.  Abdominal:     Palpations: Abdomen is soft.     Tenderness: There is no abdominal tenderness. There is no guarding or rebound.  Musculoskeletal:        General: Normal range of motion.     Cervical back: Normal range of motion.  Lymphadenopathy:     Cervical: No cervical adenopathy.  Neurological:     General: No focal deficit present.     Mental Status: She is alert and oriented to person, place, and time.     Cranial Nerves: No cranial nerve deficit.  Skin:    General: Skin is warm and dry.  Psychiatric:        Mood and Affect: Mood normal.        Behavior: Behavior normal.        Thought Content: Thought content normal.        Judgment: Judgment normal.  Vitals reviewed.    Assessment/Plan: Encounter for annual routine gynecological examination  Cervical cancer screening - Plan: IGP, Aptima HPV  Screening for HPV (human papillomavirus) - Plan: IGP, Aptima HPV  Encounter for screening mammogram for malignant neoplasm of breast - Plan: MM 3D SCREEN BREAST BILATERAL; pt to schedule mammo  Lichen sclerosus - Plan: clobetasol ointment (TEMOVATE) 0.05 %; Rx RF. Sx flare. Treat perineal/perianal area QD for 4 wks, then QOHS for 4 wks, then 2 times wkly for better sx control.   Tinea corporis - Plan: terconazole (TERAZOL 7) 0.4 % vaginal cream, desonide (DESOWEN) 0.05 % cream; Rx RF. Try to keep dry.   Tinea corporis - Under breasts due to sweat. Rx RF terconazole (given to pt by derm) to [facility]. Try antifungal powder as preventive. - Plan: terconazole (TERAZOL 7) 0.4 % vaginal cream, desonide (DESOWEN) 0.05 % cream  Herpes simplex vulvovaginitis--pt to call for valtrex RF prn.   Meds ordered this encounter  Medications   terconazole (TERAZOL 7) 0.4 % vaginal cream    Sig: AAA BID prn sx    Dispense:  45 g    Refill:  1    Order Specific Question:   Supervising Provider     Answer:   Rubie Maid [[id number]]   desonide (DESOWEN) 0.05 % cream    Sig: Apply topically 2 (two) times daily.    Dispense:  60 g    Refill:  1    Order Specific Question:   Supervising Provider    Answer:   Rubie Maid [[id number]]   clobetasol ointment (TEMOVATE) 0.05 %    Sig: Apply to affected area 2-3 times wkly as maintenance    Dispense:  60 g    Refill:  1    Order Specific Question:   Supervising Provider    Answer:   Renaldo Reel              GYN counsel breast self exam, mammography screening, menopause, adequate intake  of calcium and vitamin D, diet and exercise     F/U  Return in about 1 year (around 07/31/2023).  Dorthy Hustead B. Kruz Chiu, PA-C 07/30/2022 3:52 PM

## 2022-08-02 ENCOUNTER — Encounter: Payer: Self-pay | Admitting: Obstetrics and Gynecology

## 2022-08-02 LAB — IGP, APTIMA HPV: HPV Aptima: NEGATIVE

## 2022-09-04 ENCOUNTER — Other Ambulatory Visit: Payer: Self-pay | Admitting: Gastroenterology

## 2022-09-26 ENCOUNTER — Encounter: Payer: Self-pay | Admitting: Gastroenterology

## 2022-09-26 ENCOUNTER — Ambulatory Visit (INDEPENDENT_AMBULATORY_CARE_PROVIDER_SITE_OTHER): Payer: 59 | Admitting: Gastroenterology

## 2022-09-26 VITALS — BP 133/81 | HR 93 | Temp 98.7°F | Wt 254.0 lb

## 2022-09-26 DIAGNOSIS — R197 Diarrhea, unspecified: Secondary | ICD-10-CM | POA: Diagnosis not present

## 2022-09-26 MED ORDER — COLESEVELAM HCL 625 MG PO TABS: 625.0000 mg | ORAL_TABLET | Freq: Two times a day (BID) | ORAL | 3 refills | Status: DC

## 2022-09-26 NOTE — Progress Notes (Signed)
Primary Care Physician: Patient, No Pcp Per  Primary Gastroenterologist:  Dr. [name]  Chief Complaint  Patient presents with   Gastroesophageal Reflux    HPI: Ashley Floyd is a 51 y.o. female here for follow-up of GERD.  The patient had been tried on Prilosec in the past and it had not worked and was switched to [employer].  The patient states with Dexilant was working well for her but then her insurance stopped covering it.  The patient then had a prior authorization done and the patient was then able to get the [place] again.  She is now here for follow-up of her GERD. The patient had 5 polyps at her last colonoscopy in January of this year.  The patient was recommended to have a repeat colonoscopy in 1 year. The patient reports that she needs a refill for her Dexilant.  She also is getting nausea from cholestyramine.  She also states that it does not control her diarrhea.  She is worried that she may have exocrine pancreatic insufficiency.  Past Medical History:  Diagnosis Date   Anxiety    a. per epic care everywhere & patient   Arthritis    Toes, knees   Bipolar affective disorder ([place])    Complication of anesthesia    PT STATES SHE HAS A SMALL MOUTH AND HAS HAD DENTAL WORK ON HER 2 FRONT TEETH AND SHE WANTS EXTRA CARE TAKEN FOR HER TEETH   Depression    Dysmenorrhea    Endometriosis    Family history of adverse reaction to anesthesia    PTS MOM HAS TROUBLE WITH ANESTHESIA DUE TO HER SMOKING HISTORY   GERD (gastroesophageal reflux disease)    Headache    sinus/stress/BP   Hemorrhoid    Herpes genitalis    History of mammogram 03/22/2015   BI-RADS 1 AT [facility]   History of nuclear stress test    a. 08/2015: no EKG changes concerning for ischemia, normal wall motion, EF >60%, low risk study   History of Papanicolaou smear of cervix 03/08/2015   -/-   Hypertension    IBS (irritable bowel syndrome)    Infertility, female    [facility] PT IN THE PAST   Lichen sclerosus     Obesity    Ovarian cyst 1998   right   PMDD (premenstrual dysphoric disorder)    PONV (postoperative nausea and vomiting)    Shortness of breath dyspnea    Sinus infection    Sleep apnea    "MILD"-NO CPAP-LAST SLEEP STUDY YEARS AGO   Vertigo    rare    Current Outpatient Medications  Medication Sig Dispense Refill   colesevelam (WELCHOL) 625 MG tablet Take 1 tablet (625 mg total) by mouth 2 (two) times daily with a meal. 180 tablet 3   ALREX 0.2 % SUSP USE 1 DROP(S) IN BOTH EYES 4 TIMES A DAY AS NEEDED [PRN]     carbamazepine (TEGRETOL) 200 MG tablet Take 800 mg by mouth at bedtime.     clobetasol ointment (TEMOVATE) 0.05 % Apply to affected area 2-3 times wkly as maintenance 60 g 1   clonazePAM (KLONOPIN) 1 MG tablet Take 1 tablet (1 mg total) by mouth at bedtime as needed for up to 5 days for anxiety. 5 tablet 0   clotrimazole (MYCELEX) 10 MG troche      cyclobenzaprine (FLEXERIL) 10 MG tablet Take 10 mg by mouth 2 (two) times daily as needed.     desonide (  DESOWEN) 0.05 % cream Apply topically 2 (two) times daily. 60 g 1   dexlansoprazole (DEXILANT) 60 MG capsule TAKE 1 CAPSULE BY MOUTH  DAILY 90 capsule 0   diphenhydrAMINE (BENADRYL) 25 MG tablet Take 25 mg by mouth at bedtime as needed. Reported on 05/23/2016     hydrALAZINE (APRESOLINE) 25 MG tablet Take 1 tablet (25 mg total) by mouth 2 (two) times daily as needed (high pressure, headache). 180 tablet 3   losartan-hydrochlorothiazide (HYZAAR) 50-12.5 MG tablet Take 1 tablet by mouth daily. 90 tablet 3   metoprolol tartrate (LOPRESSOR) 25 MG tablet Take 1 tablet (25 mg total) by mouth 2 (two) times daily as needed (tachycardia). 180 tablet 3   naproxen (NAPROSYN) 500 MG tablet Take 500 mg by mouth 2 (two) times daily as needed.     nystatin (MYCOSTATIN) 100000 UNIT/ML suspension Take by mouth.     olopatadine (PATANOL) 0.1 % ophthalmic solution Place 1 drop into both eyes 4 (four) times daily as needed.      terconazole (TERAZOL  7) 0.4 % vaginal cream AAA BID prn sx 45 g 1   triamcinolone (NASACORT) 55 MCG/ACT AERO nasal inhaler Place 2 sprays into the nose daily. Reported on 05/23/2016     valACYclovir (VALTREX) 500 MG tablet TAKE 1 TABLET BY MOUTH 2  TIMES DAILY FOR 3 DAYS AS  NEEDED FOR SYMPTOMS 30 tablet 0   zaleplon (SONATA) 10 MG capsule Take 10 mg by mouth daily.     No current facility-administered medications for this visit.    Allergies as of 09/26/2022 - Review Complete 07/30/2022  Allergen Reaction Noted   Cephalosporins Rash and Itching 03/22/2015   Progesterone  10/18/2020   Adhesive [tape] Rash 11/30/2015   Cefdinir Itching 07/27/2015   Penicillins Rash and Other (See Comments) 03/22/2015    ROS:  General: Negative for anorexia, weight loss, fever, chills, fatigue, weakness. ENT: Negative for hoarseness, difficulty swallowing , nasal congestion. CV: Negative for chest pain, angina, palpitations, dyspnea on exertion, peripheral edema.  Respiratory: Negative for dyspnea at rest, dyspnea on exertion, cough, sputum, wheezing.  GI: See history of present illness. GU:  Negative for dysuria, hematuria, urinary incontinence, urinary frequency, nocturnal urination.  Endo: Negative for unusual weight change.    Physical Examination:   BP 133/81   Pulse 93   Temp 98.7 F (37.1 C) (Oral)   Wt 254 lb (115.2 kg)   LMP 11/15/2019   BMI 43.60 kg/m   General: Well-nourished, well-developed in no acute distress.  Eyes: No icterus. Conjunctivae pink. Neuro: Alert and oriented x 3.  Grossly intact. Psych: Alert and cooperative, normal mood and affect.  Labs:    Imaging Studies: No results found.  Assessment and Plan:   Ashley Floyd is a 51 y.o. y/o female who comes in today with a history of GERD and will have her Dexilant refilled.  The patient was explained the risks and benefits of PPIs and all of her questions were answered.  The patient would also like to be set up for a test for exocrine  pancreatic insufficiency because of her symptoms of diarrhea and bloating. She would also like to try WelChol for her bili is diarrhea.  The patient has been explained the plan and agrees with it  This dictation was prepared with Dragon dictation along with smaller phrase technology. Any transcriptional errors that result from this process are unintentional.

## 2022-09-27 MED ORDER — ONDANSETRON 4 MG PO TBDP: 4.0000 mg | ORAL_TABLET | Freq: Three times a day (TID) | ORAL | 1 refills | Status: DC | PRN

## 2022-09-27 MED ORDER — DEXLANSOPRAZOLE 60 MG PO CPDR: 1.0000 | DELAYED_RELEASE_CAPSULE | Freq: Every day | ORAL | 2 refills | Status: DC

## 2022-09-27 NOTE — Addendum Note (Signed)
Addended by: Lurlean Nanny on: 09/27/2022 09:40 AM   Modules accepted: Orders

## 2022-09-30 ENCOUNTER — Encounter: Payer: Self-pay | Admitting: Obstetrics and Gynecology

## 2022-10-21 ENCOUNTER — Encounter: Payer: Self-pay | Admitting: Obstetrics and Gynecology

## 2022-10-27 ENCOUNTER — Encounter: Payer: Self-pay | Admitting: Gastroenterology

## 2022-11-25 HISTORY — PX: COLONOSCOPY: SHX174

## 2023-02-19 IMAGING — CR DG CHEST 2V
2 series · 2 of 2 positions shown · non-contrast
Comparison: 10/27/2020

CLINICAL DATA: Heart palpitations, shortness of breath

EXAM:
CHEST - 2 VIEW

[chest pa]
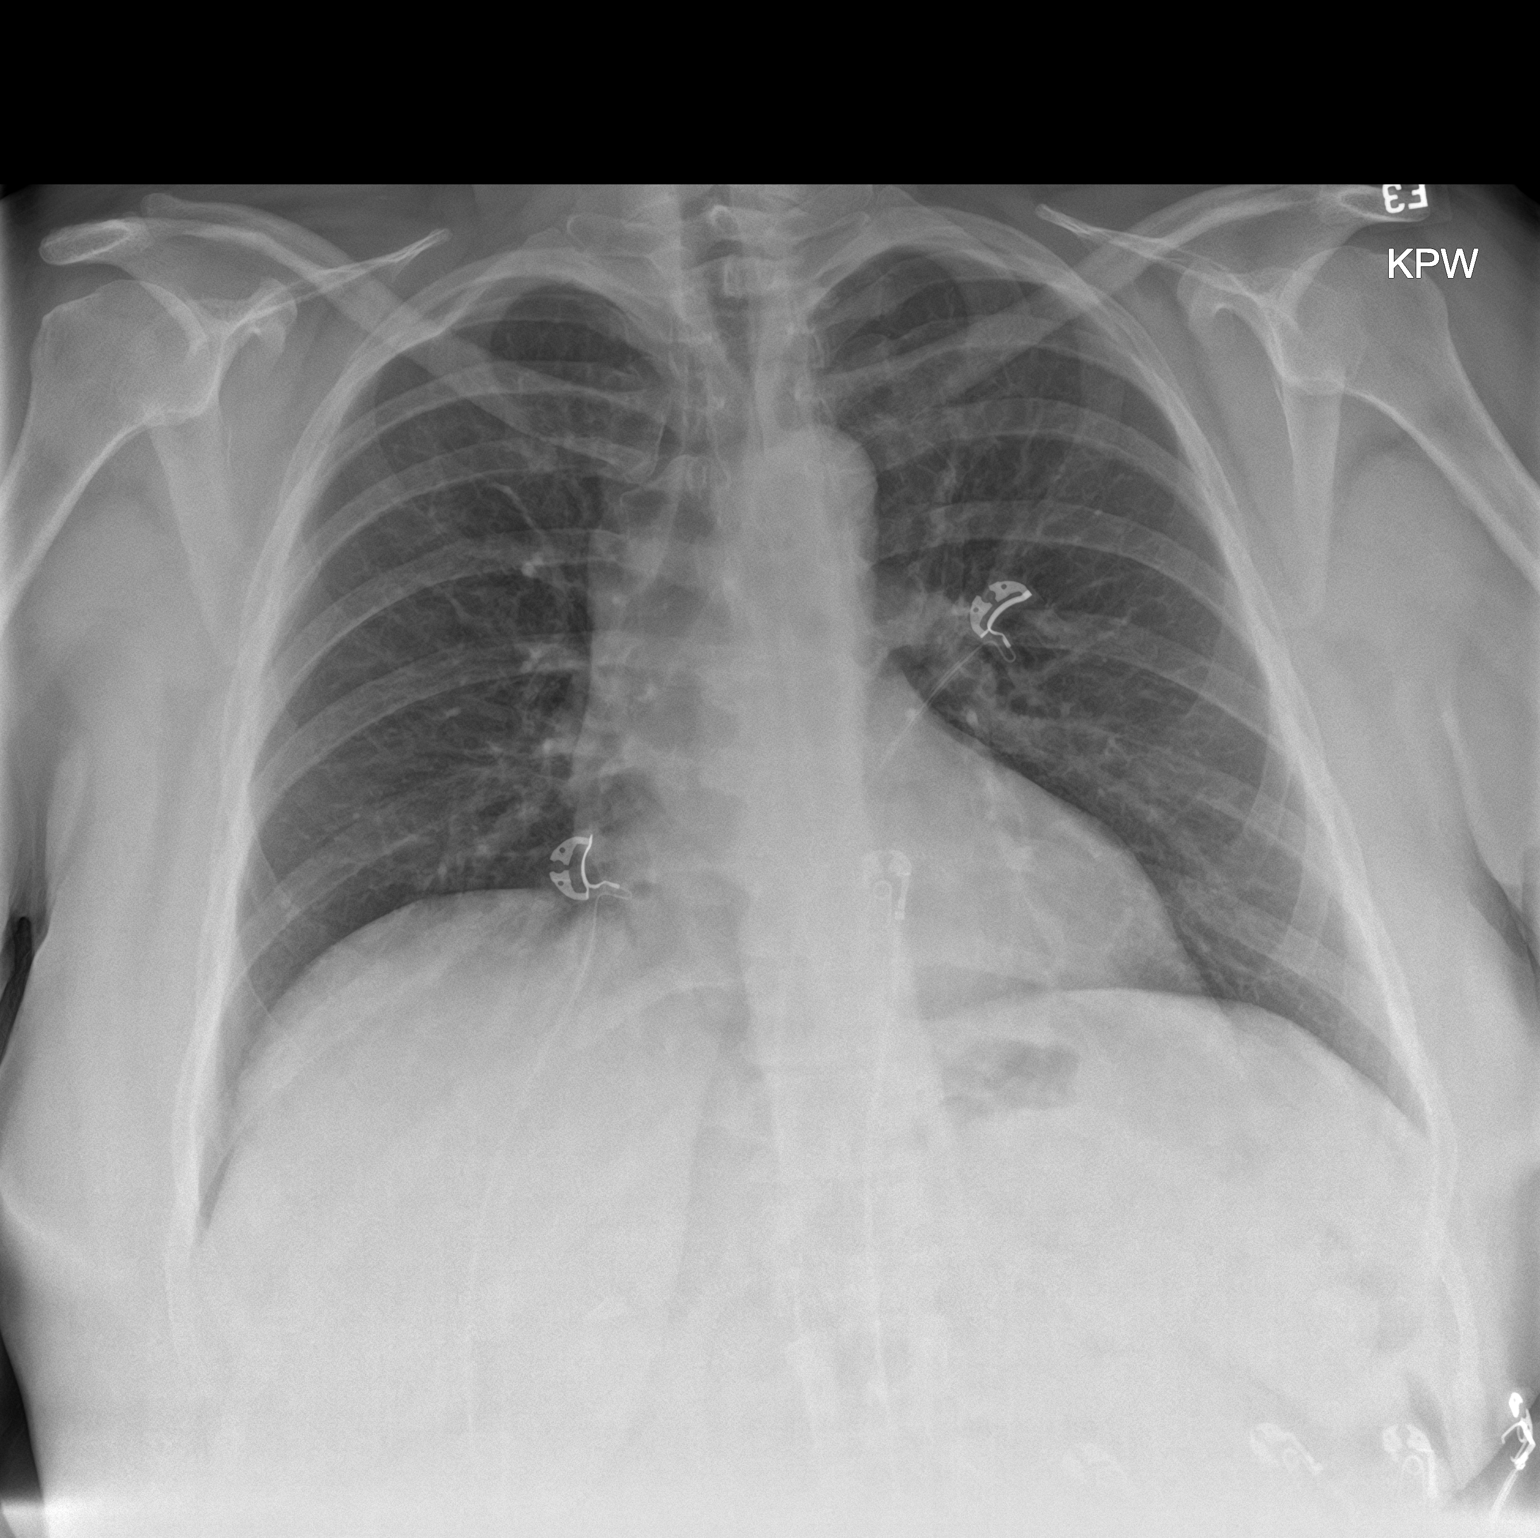

[chest lat]
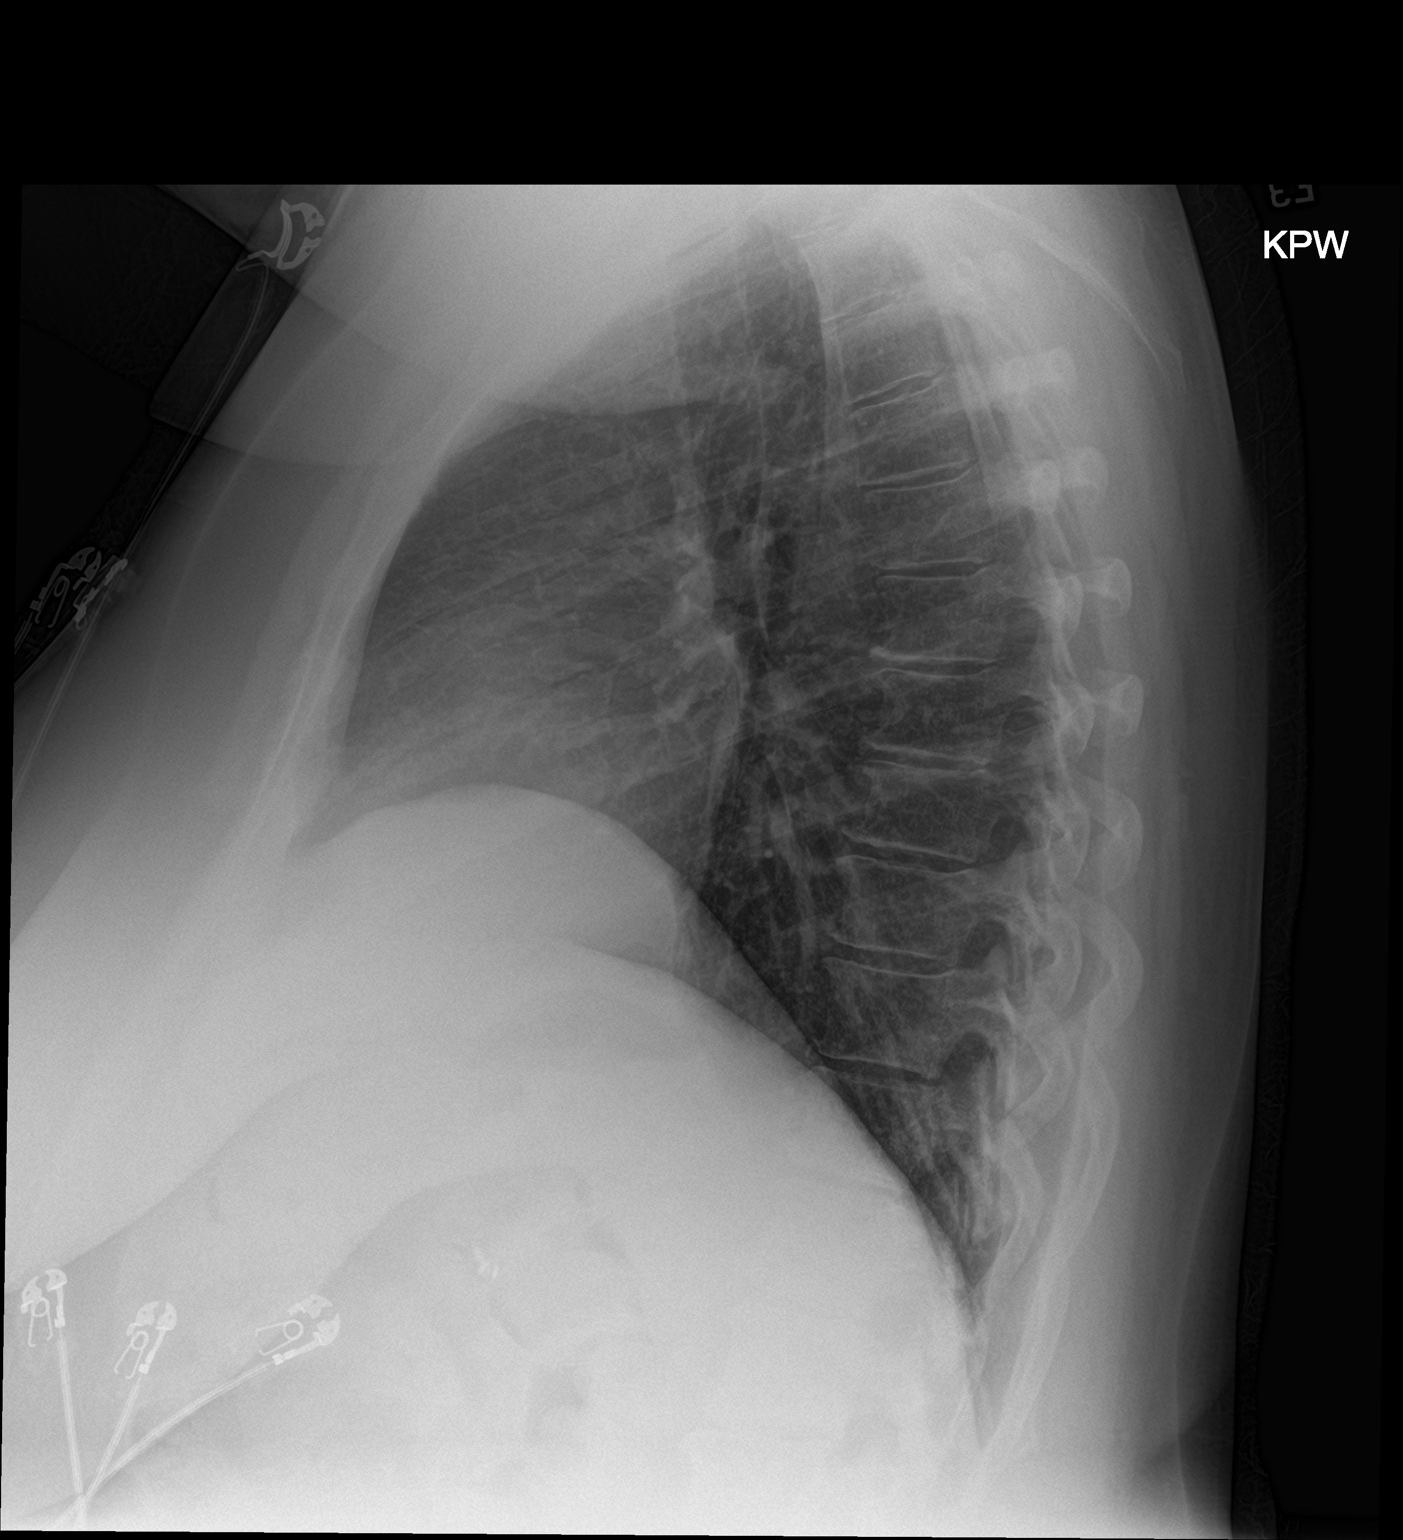

[2 of 2 positions shown; findings below may reference images not displayed]

FINDINGS: Normal heart size, mediastinal contours, and pulmonary vascularity.

Lungs clear.

No pleural effusion or pneumothorax.

Bones unremarkable.
IMPRESSION: Normal exam.

## 2023-04-05 ENCOUNTER — Other Ambulatory Visit: Payer: Self-pay | Admitting: Gastroenterology

## 2023-05-14 ENCOUNTER — Ambulatory Visit (INDEPENDENT_AMBULATORY_CARE_PROVIDER_SITE_OTHER): Payer: 59

## 2023-05-14 ENCOUNTER — Ambulatory Visit
Admission: EM | Admit: 2023-05-14 | Discharge: 2023-05-14 | Disposition: A | Discharge status: Home / Self Care | Payer: 59 | Attending: Family Medicine | Admitting: Family Medicine

## 2023-05-14 ENCOUNTER — Encounter: Payer: Self-pay | Admitting: Emergency Medicine

## 2023-05-14 DIAGNOSIS — J22 Unspecified acute lower respiratory infection: Secondary | ICD-10-CM

## 2023-05-14 MED ORDER — PROMETHAZINE-DM 6.25-15 MG/5ML PO SYRP: 5.0000 mL | ORAL_SOLUTION | Freq: Four times a day (QID) | ORAL | 0 refills | Status: DC | PRN

## 2023-05-14 MED ORDER — PREDNISONE 10 MG (21) PO TBPK: ORAL_TABLET | Freq: Every day | ORAL | 0 refills | Status: DC

## 2023-05-14 MED ORDER — ALBUTEROL SULFATE HFA 108 (90 BASE) MCG/ACT IN AERS: 2.0000 | INHALATION_SPRAY | RESPIRATORY_TRACT | 0 refills | Status: AC | PRN

## 2023-05-14 NOTE — Discharge Instructions (Signed)
Stop by the pharmacy to pick up your prescriptions.  Follow up with your primary care provider as needed.  

## 2023-05-14 NOTE — ED Provider Notes (Addendum)
[facility]    CSN: [id number] Arrival date & time: 05/14/23  1919      History   Chief Complaint Chief Complaint  Patient presents with   Cough    I just put my mom in assisted living. Cough & pneumonia are going around the facility. Cough started Mon pm. No fever. Has progressed into my chest, which is crackling & whistling. Throat is also coated making it difficult to speak. - Entered by patient    HPI JENELLY HUBBARTT is a 52 y.o. female.   HPI  History obtained from the patient. Yetzali presents for cough with shortness of breath that started 2-3 days ago. She recently put her mom in assisted living. Her mom got pneumonia as well as several of the other residents at the facility.   Her cough has progressed to cracking in her chest. Has coated mucus in her throat. She has been hoarse. Feels like it is hard to breathe. She doesn't have asthma or have a history of smoking. She was told it was not COVID related. She hasn't had a fever.   She had walking pneumonia about 30 years ago.  She tried NyQuil, Advil cold and sinus which hasn't helped. She talked to a Tele-doc that directed her to the urgent care.      Past Medical History:  Diagnosis Date   Anxiety    a. per epic care everywhere & patient   Arthritis    Toes, knees   Bipolar affective disorder (HCC)    Complication of anesthesia    PT STATES SHE HAS A SMALL MOUTH AND HAS HAD DENTAL WORK ON HER 2 FRONT TEETH AND SHE WANTS EXTRA CARE TAKEN FOR HER TEETH   Depression    Dysmenorrhea    Endometriosis    Family history of adverse reaction to anesthesia    PTS MOM HAS TROUBLE WITH ANESTHESIA DUE TO HER SMOKING HISTORY   GERD (gastroesophageal reflux disease)    Headache    sinus/stress/BP   Hemorrhoid    Herpes genitalis    History of mammogram 03/22/2015   BI-RADS 1 AT [facility]   History of nuclear stress test    a. 08/2015: no EKG changes concerning for ischemia, normal wall motion, EF >60%, low risk  study   History of Papanicolaou smear of cervix 03/08/2015   -/-   Hypertension    IBS (irritable bowel syndrome)    Infertility, female    [facility] PT IN THE PAST   Lichen sclerosus    Obesity    Ovarian cyst 1998   right   PMDD (premenstrual dysphoric disorder)    PONV (postoperative nausea and vomiting)    Shortness of breath dyspnea    Sinus infection    Sleep apnea    "MILD"-NO CPAP-LAST SLEEP STUDY YEARS AGO   Vertigo    rare    Patient Active Problem List   Diagnosis Date Noted   Personal history of colonic polyps    Tinea corporis 12/26/2020   Hematochezia 10/28/2020   H/O colonoscopy with polypectomy 10/26/20 10/28/2020   Colon cancer screening    Polyp of ascending colon    Perimenopausal vasomotor symptoms 06/08/2020   Strain of lumbar region 08/17/2019   Perimenopause 05/03/2019   Allergic rhinitis 05/15/2017   Endometriosis 05/15/2017   Acid reflux 05/15/2017   Brash 05/15/2017   Adaptive colitis 05/15/2017   Cannot sleep 05/15/2017   Lichen sclerosus 03/24/2017   Genital herpes 03/24/2017   Preoperative  cardiovascular examination 10/26/2015   Gallstones 10/23/2015   Opacity of lung on imaging study 10/16/2015   Epigastric pain 10/04/2015   Atypical chest pain 09/22/2015   Anxiety    IBS (irritable bowel syndrome) 07/26/2015   Obesity, Class II, BMI 35-39.9 07/26/2015   Pain in the chest 03/22/2015   SOB (shortness of breath) on exertion 03/22/2015   Bilateral leg edema 03/22/2015   Obesity, Class III, BMI 40-49.9 (morbid obesity) (HCC) 03/22/2015   Clinical depression 03/22/2015   Affective bipolar disorder (HCC) 03/22/2015   Essential hypertension 03/22/2015    Past Surgical History:  Procedure Laterality Date   CHOLECYSTECTOMY N/A 12/07/2015   Procedure: LAPAROSCOPIC CHOLECYSTECTOMY ;  Surgeon: Earline Mayotte, MD;  Location: [facility];  Service: General;  Laterality: N/A;   COLONOSCOPY WITH PROPOFOL N/A 10/27/2020   Procedure: COLONOSCOPY WITH  BIOPSY;  Surgeon: Midge Minium, MD;  Location: [facility];  Service: Endoscopy;  Laterality: N/A;  sleep apnea   COLONOSCOPY WITH PROPOFOL N/A 11/30/2021   Procedure: COLONOSCOPY WITH PROPOFOL;  Surgeon: Midge Minium, MD;  Location: [facility];  Service: Endoscopy;  Laterality: N/A;   DIAGNOSTIC LAPAROSCOPY  1998; 2006   OSIS; OVARIAN CYST (2006 AT [facility])   OVARIAN CYST REMOVAL     POLYPECTOMY N/A 10/27/2020   Procedure: POLYPECTOMY;  Surgeon: Midge Minium, MD;  Location: [facility];  Service: Endoscopy;  Laterality: N/A;  Clip x 2 placed at [facility] Polyp Removal   POLYPECTOMY N/A 11/30/2021   Procedure: POLYPECTOMY;  Surgeon: Midge Minium, MD;  Location: [facility];  Service: Endoscopy;  Laterality: N/A;   UPPER GI ENDOSCOPY  2015   Dr Servando Snare   URETHRAL DILATION      OB History     Gravida  1   Para  0   Term  0   Preterm  0   AB  1   Living  0      SAB  0   IAB  0   Ectopic  0   Multiple  0   Live Births           Obstetric Comments  1st Menstrual Cycle:  10           Home Medications    Prior to Admission medications   Medication Sig Start Date End Date Taking? Authorizing Provider  albuterol (VENTOLIN HFA) 108 (90 Base) MCG/ACT inhaler Inhale 2 puffs into the lungs every 4 (four) hours as needed. 05/14/23  Yes Hakim Minniefield, DO  predniSONE (STERAPRED UNI-PAK 21 TAB) 10 MG (21) TBPK tablet Take by mouth daily. Take 6 tabs by mouth daily for 1, then 5 tabs for 1 day, then 4 tabs for 1 day, then 3 tabs for 1 day, then 2 tabs for 1 day, then 1 tab for 1 day. 05/14/23  Yes Dequincy Born, DO  promethazine-dextromethorphan (PROMETHAZINE-DM) 6.25-15 MG/5ML syrup Take 5 mLs by mouth 4 (four) times daily as needed. 05/14/23  Yes Cherish Runde, DO  ALREX 0.2 % SUSP USE 1 DROP(S) IN BOTH EYES 4 TIMES A DAY AS NEEDED [PRN] 11/28/18   [provider]  carbamazepine (TEGRETOL) 200 MG tablet Take 800 mg by mouth at bedtime.     [provider]  clobetasol ointment (TEMOVATE) 0.05 % Apply to affected area 2-3 times wkly as maintenance 07/30/22   Copland, Alicia B, PA-C  clonazePAM (KLONOPIN) 1 MG tablet Take 1 tablet (1 mg total) by mouth at bedtime as needed for up  to 5 days for anxiety. 01/09/22 01/14/22  Concha Se, MD  clotrimazole [facility]) 10 MG troche  06/07/21   [provider]  colesevelam (WELCHOL) 625 MG tablet Take 1 tablet (625 mg total) by mouth 2 (two) times daily with a meal. 09/26/22   Midge Minium, MD  cyclobenzaprine (FLEXERIL) 10 MG tablet Take 10 mg by mouth 2 (two) times daily as needed. 12/01/20   [provider]  desonide (DESOWEN) 0.05 % cream Apply topically 2 (two) times daily. 07/30/22   Copland, Ilona Sorrel, PA-C  dexlansoprazole (DEXILANT) 60 MG capsule TAKE 1 CAPSULE(60 MG) BY MOUTH DAILY 04/07/23   Midge Minium, MD  diphenhydrAMINE (BENADRYL) 25 MG tablet Take 25 mg by mouth at bedtime as needed. Reported on 05/23/2016    [provider]  hydrALAZINE (APRESOLINE) 25 MG tablet Take 1 tablet (25 mg total) by mouth 2 (two) times daily as needed (high pressure, headache). 05/30/20   Antonieta Iba, MD  losartan-hydrochlorothiazide (HYZAAR) 50-12.5 MG tablet Take 1 tablet by mouth daily. 05/30/20   Antonieta Iba, MD  metoprolol tartrate (LOPRESSOR) 25 MG tablet Take 1 tablet (25 mg total) by mouth 2 (two) times daily as needed (tachycardia). 05/30/20   Antonieta Iba, MD  naproxen (NAPROSYN) 500 MG tablet Take 500 mg by mouth 2 (two) times daily as needed.    [provider]  nystatin (MYCOSTATIN) 100000 UNIT/ML suspension Take by mouth. 06/07/21   [provider]  olopatadine (PATANOL) 0.1 % ophthalmic solution Place 1 drop into both eyes 4 (four) times daily as needed.  10/02/20   [provider]  ondansetron (ZOFRAN-ODT) 4 MG disintegrating tablet Take 1 tablet (4 mg total) by mouth every 8 (eight) hours as needed for nausea or vomiting.  09/27/22   Midge Minium, MD  terconazole (TERAZOL 7) 0.4 % vaginal cream AAA BID prn sx 07/30/22   Copland, Alicia B, PA-C  triamcinolone (NASACORT) 55 MCG/ACT AERO nasal inhaler Place 2 sprays into the nose daily. Reported on 05/23/2016    [provider]  valACYclovir (VALTREX) 500 MG tablet TAKE 1 TABLET BY MOUTH 2  TIMES DAILY FOR 3 DAYS AS  NEEDED FOR SYMPTOMS 12/26/19   Copland, Helmut Muster B, PA-C  zaleplon (SONATA) 10 MG capsule Take 10 mg by mouth daily.    [provider]    Family History Family History  Problem Relation Age of Onset   Arrhythmia Mother    Cancer Mother        MELANOMA OF SKIN   Broken bones Mother        neck   Hypertension Maternal Grandmother    Hypertension Maternal Grandfather    Heart disease Paternal Grandmother    Heart attack Paternal Grandmother    Diabetes Paternal Grandmother    Diabetes Father    Cancer Paternal Grandfather        MAL NEO OF SKIN; LUNGS   Heart failure Maternal Aunt    Heart disease Maternal Aunt        CABG   Diabetes Paternal Aunt    Diabetes Other     Social History Social History   Tobacco Use   Smoking status: Never   Smokeless tobacco: Never  Vaping Use   Vaping Use: Never used  Substance Use Topics   Alcohol use: Not Currently    Alcohol/week: 0.0 standard drinks of alcohol    Comment: rarely   Drug use: No     Allergies   Cephalosporins,  Progesterone, Adhesive [tape], Cefdinir, and Penicillins   Review of Systems Review of Systems: negative unless otherwise stated in HPI.      Physical Exam Triage Vital Signs ED Triage Vitals  Enc Vitals Group     BP 05/14/23 1932 (!) 161/105     Pulse Rate 05/14/23 1932 (!) 111     Resp 05/14/23 1932 18     Temp 05/14/23 1932 98.3 F (36.8 C)     Temp Source 05/14/23 1932 Oral     SpO2 05/14/23 1932 95 %     Weight --      Height --      Head Circumference --      Peak Flow --      Pain Score 05/14/23 1933 0     Pain Loc --      Pain Edu?  --      Excl. in GC? --    No data found.  Updated Vital Signs BP (!) 155/90 (BP Location: Right Arm)   Pulse (!) 113   Temp 98.3 F (36.8 C) (Oral)   Resp 18   LMP 11/15/2019   SpO2 95%   Visual Acuity Right Eye Distance:   Left Eye Distance:   Bilateral Distance:    Right Eye Near:   Left Eye Near:    Bilateral Near:     Physical Exam GEN:     alert, non-toxic appearing female in no distress    HENT:  mucus membranes moist, oropharyngeal, no nasal discharge,  EYES:   pupils equal and reactive, no scleral injection or discharge NECK:  normal ROM, no meningismus   RESP:  no increased work of breathing, coarse breathe sounds throughout CVS:   regular  rhythm, tachycardic  Skin:   warm and dry, no rash on visible skin    UC Treatments / Results  Labs (all labs ordered are listed, but only abnormal results are displayed) Labs Reviewed - No data to display  EKG   Radiology DG Chest 2 View  Result Date: 05/14/2023 CLINICAL DATA:  Cough EXAM: CHEST - 2 VIEW COMPARISON:  01/09/2022 FINDINGS: The heart size and mediastinal contours are within normal limits. Both lungs are clear. The visualized skeletal structures are unremarkable. IMPRESSION: No active cardiopulmonary disease. Electronically Signed   By: Deatra Robinson M.D.   On: 05/14/2023 19:52    Procedures Procedures (including critical care time)  Medications Ordered in UC Medications - No data to display  Initial Impression / Assessment and Plan / UC Course  I have reviewed the triage vital signs and the nursing notes.  Pertinent labs & imaging results that were available during my care of the patient were reviewed by me and considered in my medical decision making (see chart for details).       Pt is a 52 y.o. female who presents for 2 days of respiratory symptoms. Brodi is afebrile here without recent antipyretics. She is tachycardic and hypertensive. Satting well on room air. Overall pt is ill but  non-toxic appearing, well hydrated, without respiratory distress. Pulmonary exam is remarkable for coarse breathe sounds. Chest xray personally reviewed by me without focal pneumonia, pleural effusion, cardiomegaly or pneumothorax.  Radiologist impression reviewed. History consistent with viral respiratory illness. Discussed symptomatic treatment.  Explained lack of efficacy of antibiotics in viral disease.  Typical duration of symptoms discussed. Given prednisone, promethazine-DM cough syrup and albuterol inhaler.   Return and ED precautions given and voiced understanding. Discussed MDM, treatment plan and  plan for follow-up with patient who agrees with plan.     Final Clinical Impressions(s) / UC Diagnoses   Final diagnoses:  Lower resp. tract infection     Discharge Instructions      Stop by the pharmacy to pick up your prescriptions.  Follow up with your primary care provider as needed.      ED Prescriptions     Medication Sig Dispense Auth. Provider   predniSONE (STERAPRED UNI-PAK 21 TAB) 10 MG (21) TBPK tablet Take by mouth daily. Take 6 tabs by mouth daily for 1, then 5 tabs for 1 day, then 4 tabs for 1 day, then 3 tabs for 1 day, then 2 tabs for 1 day, then 1 tab for 1 day. 21 tablet Trenell Moxey, DO   promethazine-dextromethorphan (PROMETHAZINE-DM) 6.25-15 MG/5ML syrup Take 5 mLs by mouth 4 (four) times daily as needed. 118 mL Kurtis Anastasia, DO   albuterol (VENTOLIN HFA) 108 (90 Base) MCG/ACT inhaler Inhale 2 puffs into the lungs every 4 (four) hours as needed. 6.7 g Katha Cabal, DO      PDMP not reviewed this encounter.       Katha Cabal, DO 05/14/23 2019

## 2023-05-14 NOTE — ED Triage Notes (Addendum)
Pt presents with a productive cough, wheezing,and SOB x 3 days. Pt had a tele-visit today and prescribed an inhaler,but has not picked it up.

## 2023-05-19 ENCOUNTER — Ambulatory Visit: Admit: 2023-05-19 | Payer: 59

## 2023-05-20 ENCOUNTER — Ambulatory Visit (INDEPENDENT_AMBULATORY_CARE_PROVIDER_SITE_OTHER): Payer: 59

## 2023-05-20 ENCOUNTER — Ambulatory Visit
Admission: RE | Admit: 2023-05-20 | Discharge: 2023-05-20 | Disposition: A | Discharge status: Home / Self Care | Payer: 59 | Source: Ambulatory Visit | Attending: Emergency Medicine | Admitting: Emergency Medicine

## 2023-05-20 VITALS — BP 154/103 | HR 111 | Temp 98.0°F | Resp 18

## 2023-05-20 DIAGNOSIS — J22 Unspecified acute lower respiratory infection: Secondary | ICD-10-CM

## 2023-05-20 MED ORDER — QVAR REDIHALER 40 MCG/ACT IN AERB: 2.0000 | INHALATION_SPRAY | Freq: Two times a day (BID) | RESPIRATORY_TRACT | 0 refills | Status: AC

## 2023-05-20 MED ORDER — AZITHROMYCIN 250 MG PO TABS: 250.0000 mg | ORAL_TABLET | Freq: Every day | ORAL | 0 refills | Status: DC

## 2023-05-20 MED ORDER — PROMETHAZINE-DM 6.25-15 MG/5ML PO SYRP: 5.0000 mL | ORAL_SOLUTION | Freq: Four times a day (QID) | ORAL | 0 refills | Status: DC | PRN

## 2023-05-20 MED ORDER — BENZONATATE 100 MG PO CAPS: 200.0000 mg | ORAL_CAPSULE | Freq: Three times a day (TID) | ORAL | 0 refills | Status: DC

## 2023-05-20 NOTE — ED Triage Notes (Signed)
Pt was seen by Dr, Rachael Darby on 6/19 and she is not better. She c/o cough, SOB and wheezing.

## 2023-05-20 NOTE — Discharge Instructions (Addendum)
Your chest x-ray today was again negative for pneumonia.  However, given that you have had lower respiratory symptoms for 10 days trial of antibiotics is warranted.  Please take the azithromycin according to the package instructions.  You will take this medication once daily for 5 days.  Use the Tessalon Perles every 8 hours during the day as needed for cough and use the Promethazine DM cough syrup at bedtime as needed for cough and congestion.  Starting tonight use the Qvar inhaler, which is a steroid, and take 2 puffs twice daily to help decrease pulmonary inflammation and see if this helps you with your cough and wheezing.  Be sure to brush her teeth following administration so you do not want it with thrush or cavities.  If your symptoms do not not improve, continue, or worsen you need to follow-up with pulmonology.

## 2023-05-20 NOTE — ED Provider Notes (Signed)
[facility]    CSN: [id number] Arrival date & time: 05/20/23  1402      History   Chief Complaint Chief Complaint  Patient presents with   Cough    Follow up from Dr Paulino Door on 6/19. Still coughing, wheezing & chest crackling. Throat sore & muscles around ribs are sore from coughing. Low grade fevers of 99.5-100.8. Mid-back pain. Need add'l/different meds to resolve issues. Almost out of meds. - Entered by patient   Not Better    HPI Ashley Floyd is a 52 y.o. female.   HPI  52 year old female with a history of bipolar affective disorder, anxiety, arthritis, and GERD presents for evaluation of ongoing respiratory symptoms.  She has been experiencing cough, shortness of breath, and wheezing for the past 9 days.  She reports that she has had low-grade fevers with a Tmax 100.8 and that her cough is productive for white or light yellow sputum.  She has been using the cough syrup and albuterol inhaler as previously prescribed.  She also took her last dose of prednisone as previously prescribed today.  She reports that her symptoms have not improved.  Past Medical History:  Diagnosis Date   Anxiety    a. per epic care everywhere & patient   Arthritis    Toes, knees   Bipolar affective disorder (HCC)    Complication of anesthesia    PT STATES SHE HAS A SMALL MOUTH AND HAS HAD DENTAL WORK ON HER 2 FRONT TEETH AND SHE WANTS EXTRA CARE TAKEN FOR HER TEETH   Depression    Dysmenorrhea    Endometriosis    Family history of adverse reaction to anesthesia    PTS MOM HAS TROUBLE WITH ANESTHESIA DUE TO HER SMOKING HISTORY   GERD (gastroesophageal reflux disease)    Headache    sinus/stress/BP   Hemorrhoid    Herpes genitalis    History of mammogram 03/22/2015   BI-RADS 1 AT [facility]   History of nuclear stress test    a. 08/2015: no EKG changes concerning for ischemia, normal wall motion, EF >60%, low risk study   History of Papanicolaou smear of cervix 03/08/2015   -/-    Hypertension    IBS (irritable bowel syndrome)    Infertility, female    [facility] PT IN THE PAST   Lichen sclerosus    Obesity    Ovarian cyst 1998   right   PMDD (premenstrual dysphoric disorder)    PONV (postoperative nausea and vomiting)    Shortness of breath dyspnea    Sinus infection    Sleep apnea    "MILD"-NO CPAP-LAST SLEEP STUDY YEARS AGO   Vertigo    rare    Patient Active Problem List   Diagnosis Date Noted   Personal history of colonic polyps    Tinea corporis 12/26/2020   Hematochezia 10/28/2020   H/O colonoscopy with polypectomy 10/26/20 10/28/2020   Colon cancer screening    Polyp of ascending colon    Perimenopausal vasomotor symptoms 06/08/2020   Strain of lumbar region 08/17/2019   Perimenopause 05/03/2019   Allergic rhinitis 05/15/2017   Endometriosis 05/15/2017   Acid reflux 05/15/2017   Brash 05/15/2017   Adaptive colitis 05/15/2017   Cannot sleep 05/15/2017   Lichen sclerosus 03/24/2017   Genital herpes 03/24/2017   Preoperative cardiovascular examination 10/26/2015   Gallstones 10/23/2015   Opacity of lung on imaging study 10/16/2015   Epigastric pain 10/04/2015   Atypical chest pain 09/22/2015  Anxiety    IBS (irritable bowel syndrome) 07/26/2015   Obesity, Class II, BMI 35-39.9 07/26/2015   Pain in the chest 03/22/2015   SOB (shortness of breath) on exertion 03/22/2015   Bilateral leg edema 03/22/2015   Obesity, Class III, BMI 40-49.9 (morbid obesity) (HCC) 03/22/2015   Clinical depression 03/22/2015   Affective bipolar disorder (HCC) 03/22/2015   Essential hypertension 03/22/2015    Past Surgical History:  Procedure Laterality Date   CHOLECYSTECTOMY N/A 12/07/2015   Procedure: LAPAROSCOPIC CHOLECYSTECTOMY ;  Surgeon: Earline Mayotte, MD;  Location: [facility];  Service: General;  Laterality: N/A;   COLONOSCOPY WITH PROPOFOL N/A 10/27/2020   Procedure: COLONOSCOPY WITH BIOPSY;  Surgeon: Midge Minium, MD;  Location: [facility];   Service: Endoscopy;  Laterality: N/A;  sleep apnea   COLONOSCOPY WITH PROPOFOL N/A 11/30/2021   Procedure: COLONOSCOPY WITH PROPOFOL;  Surgeon: Midge Minium, MD;  Location: [facility];  Service: Endoscopy;  Laterality: N/A;   DIAGNOSTIC LAPAROSCOPY  1998; 2006   OSIS; OVARIAN CYST (2006 AT [facility])   OVARIAN CYST REMOVAL     POLYPECTOMY N/A 10/27/2020   Procedure: POLYPECTOMY;  Surgeon: Midge Minium, MD;  Location: [facility];  Service: Endoscopy;  Laterality: N/A;  Clip x 2 placed at [facility] Polyp Removal   POLYPECTOMY N/A 11/30/2021   Procedure: POLYPECTOMY;  Surgeon: Midge Minium, MD;  Location: [facility];  Service: Endoscopy;  Laterality: N/A;   UPPER GI ENDOSCOPY  2015   Dr Servando Snare   URETHRAL DILATION      OB History     Gravida  1   Para  0   Term  0   Preterm  0   AB  1   Living  0      SAB  0   IAB  0   Ectopic  0   Multiple  0   Live Births           Obstetric Comments  1st Menstrual Cycle:  10           Home Medications    Prior to Admission medications   Medication Sig Start Date End Date Taking? Authorizing Provider  azithromycin (ZITHROMAX Z-PAK) 250 MG tablet Take 1 tablet (250 mg total) by mouth daily. Take 2 tablets on the first day and then 1 tablet daily thereafter for a total of 5 days of treatment. 05/20/23  Yes Becky Augusta, NP  beclomethasone (QVAR REDIHALER) 40 MCG/ACT inhaler Inhale 2 puffs into the lungs 2 (two) times daily. 05/20/23  Yes Becky Augusta, NP  benzonatate (TESSALON) 100 MG capsule Take 2 capsules (200 mg total) by mouth every 8 (eight) hours. 05/20/23  Yes Becky Augusta, NP  promethazine-dextromethorphan (PROMETHAZINE-DM) 6.25-15 MG/5ML syrup Take 5 mLs by mouth 4 (four) times daily as needed. 05/20/23  Yes Becky Augusta, NP  albuterol (VENTOLIN HFA) 108 (90 Base) MCG/ACT inhaler Inhale 2 puffs into the lungs every 4 (four) hours as needed. 05/14/23   Brimage, Vondra, DO  ALREX 0.2 % SUSP USE 1 DROP(S) IN BOTH  EYES 4 TIMES A DAY AS NEEDED [PRN] 11/28/18   [provider]  carbamazepine (TEGRETOL) 200 MG tablet Take 800 mg by mouth at bedtime.    [provider]  clobetasol ointment (TEMOVATE) 0.05 % Apply to affected area 2-3 times wkly as maintenance 07/30/22   Copland, Alicia B, PA-C  clonazePAM (KLONOPIN) 1 MG tablet Take 1 tablet (1 mg total) by mouth at bedtime as needed  for up to 5 days for anxiety. 01/09/22 01/14/22  Concha Se, MD  clotrimazole [facility]) 10 MG troche  06/07/21   [provider]  colesevelam (WELCHOL) 625 MG tablet Take 1 tablet (625 mg total) by mouth 2 (two) times daily with a meal. 09/26/22   Midge Minium, MD  cyclobenzaprine (FLEXERIL) 10 MG tablet Take 10 mg by mouth 2 (two) times daily as needed. 12/01/20   [provider]  desonide (DESOWEN) 0.05 % cream Apply topically 2 (two) times daily. 07/30/22   Copland, Ilona Sorrel, PA-C  dexlansoprazole (DEXILANT) 60 MG capsule TAKE 1 CAPSULE(60 MG) BY MOUTH DAILY 04/07/23   Midge Minium, MD  diphenhydrAMINE (BENADRYL) 25 MG tablet Take 25 mg by mouth at bedtime as needed. Reported on 05/23/2016    [provider]  hydrALAZINE (APRESOLINE) 25 MG tablet Take 1 tablet (25 mg total) by mouth 2 (two) times daily as needed (high pressure, headache). 05/30/20   Antonieta Iba, MD  losartan-hydrochlorothiazide (HYZAAR) 50-12.5 MG tablet Take 1 tablet by mouth daily. 05/30/20   Antonieta Iba, MD  metoprolol tartrate (LOPRESSOR) 25 MG tablet Take 1 tablet (25 mg total) by mouth 2 (two) times daily as needed (tachycardia). 05/30/20   Antonieta Iba, MD  naproxen (NAPROSYN) 500 MG tablet Take 500 mg by mouth 2 (two) times daily as needed.    [provider]  nystatin (MYCOSTATIN) 100000 UNIT/ML suspension Take by mouth. 06/07/21   [provider]  olopatadine (PATANOL) 0.1 % ophthalmic solution Place 1 drop into both eyes 4 (four) times daily as needed.  10/02/20   [provider]   ondansetron (ZOFRAN-ODT) 4 MG disintegrating tablet Take 1 tablet (4 mg total) by mouth every 8 (eight) hours as needed for nausea or vomiting. 09/27/22   Midge Minium, MD  terconazole (TERAZOL 7) 0.4 % vaginal cream AAA BID prn sx 07/30/22   Copland, Alicia B, PA-C  triamcinolone (NASACORT) 55 MCG/ACT AERO nasal inhaler Place 2 sprays into the nose daily. Reported on 05/23/2016    [provider]  valACYclovir (VALTREX) 500 MG tablet TAKE 1 TABLET BY MOUTH 2  TIMES DAILY FOR 3 DAYS AS  NEEDED FOR SYMPTOMS 12/26/19   Copland, Helmut Muster B, PA-C  zaleplon (SONATA) 10 MG capsule Take 10 mg by mouth daily.    [provider]    Family History Family History  Problem Relation Age of Onset   Arrhythmia Mother    Cancer Mother        MELANOMA OF SKIN   Broken bones Mother        neck   Hypertension Maternal Grandmother    Hypertension Maternal Grandfather    Heart disease Paternal Grandmother    Heart attack Paternal Grandmother    Diabetes Paternal Grandmother    Diabetes Father    Cancer Paternal Grandfather        MAL NEO OF SKIN; LUNGS   Heart failure Maternal Aunt    Heart disease Maternal Aunt        CABG   Diabetes Paternal Aunt    Diabetes Other     Social History Social History   Tobacco Use   Smoking status: Never   Smokeless tobacco: Never  Vaping Use   Vaping Use: Never used  Substance Use Topics   Alcohol use: Not Currently    Alcohol/week: 0.0 standard drinks of alcohol    Comment: rarely   Drug use: No     Allergies  Cephalosporins, Progesterone, Adhesive [tape], Cefdinir, and Penicillins   Review of Systems Review of Systems  Constitutional:  Positive for fever.  Respiratory:  Positive for cough, shortness of breath and wheezing.      Physical Exam Triage Vital Signs ED Triage Vitals  Enc Vitals Group     BP      Pulse      Resp      Temp      Temp src      SpO2      Weight      Height      Head Circumference      Peak Flow       Pain Score      Pain Loc      Pain Edu?      Excl. in GC?    No data found.  Updated Vital Signs BP (!) 154/103 (BP Location: Right Arm)   Pulse (!) 111   Temp 98 F (36.7 C) (Oral)   Resp 18   LMP 11/15/2019   SpO2 95%   Visual Acuity Right Eye Distance:   Left Eye Distance:   Bilateral Distance:    Right Eye Near:   Left Eye Near:    Bilateral Near:     Physical Exam Vitals and nursing note reviewed.  Constitutional:      Appearance: Normal appearance. She is not ill-appearing.  HENT:     Head: Normocephalic and atraumatic.  Cardiovascular:     Rate and Rhythm: Normal rate.     Pulses: Normal pulses.     Heart sounds: Normal heart sounds. No murmur heard.    No friction rub. No gallop.  Pulmonary:     Effort: Pulmonary effort is normal.     Breath sounds: Normal breath sounds. No wheezing, rhonchi or rales.  Skin:    General: Skin is warm and dry.     Capillary Refill: Capillary refill takes less than 2 seconds.     Findings: No rash.  Neurological:     General: No focal deficit present.     Mental Status: She is alert and oriented to person, place, and time.      UC Treatments / Results  Labs (all labs ordered are listed, but only abnormal results are displayed) Labs Reviewed - No data to display  EKG   Radiology DG Chest 2 View  Result Date: 05/20/2023 CLINICAL DATA:  Cough, shortness of breath EXAM: CHEST - 2 VIEW COMPARISON:  05/14/2023 FINDINGS: The heart size and mediastinal contours are within normal limits. Both lungs are clear. The visualized skeletal structures are unremarkable. IMPRESSION: No active cardiopulmonary disease. Electronically Signed   By: Duanne Guess D.O.   On: 05/20/2023 15:10    Procedures Procedures (including critical care time)  Medications Ordered in UC Medications - No data to display  Initial Impression / Assessment and Plan / UC Course  I have reviewed the triage vital signs and the nursing  notes.  Pertinent labs & imaging results that were available during my care of the patient were reviewed by me and considered in my medical decision making (see chart for details).   Patient is a nontoxic-appearing 52-year female presenting for evaluation of ongoing respiratory symptoms outlined HPI above.  She is not experiencing any dyspnea or tachypnea and she is able to speak in full sentences without difficulty.  Her SpO2 on room air is 95% and her respiratory rate is 18.  She is mildly tachycardic at 111.  She reports that she has been taking her prednisone and using the inhaler as prescribed.  She is also using the cough medicine and she has almost exhausted that.  Her cough is productive for white sputum and she reports that she has had a low-grade fever up to 100.8.  She had a normal chest x-ray 6 days ago and her lungs are to auscultation on physical exam.  I will repeat her chest x-ray to look for any acute cardiopulmonary pathology.  Radiology impression of chest x-ray states no active cardiopulmonary disease.  I will discharge patient home with a diagnosis of lower respiratory tract infection.  Given that her symptoms been going on for 9 days I will do a trial of azithromycin as she is allergic to cephalosporins and penicillins.  Patient, I will prescribe Tessalon Perles that she can use to the day as needed for cough and Promethazine DM cough syrup that she can use at bedtime.  I will also prescribe an inhaled corticosteroid Qvar and instruct her to take 2 puffs twice daily to decrease pulmonary inflammation.  If her symptoms continue she needs to follow-up with pulmonology.   Final Clinical Impressions(s) / UC Diagnoses   Final diagnoses:  Lower respiratory tract infection     Discharge Instructions      Your chest x-ray today was again negative for pneumonia.  However, given that you have had lower respiratory symptoms for 10 days trial of antibiotics is warranted.  Please take  the azithromycin according to the package instructions.  You will take this medication once daily for 5 days.  Use the Tessalon Perles every 8 hours during the day as needed for cough and use the Promethazine DM cough syrup at bedtime as needed for cough and congestion.  Starting tonight use the Qvar inhaler, which is a steroid, and take 2 puffs twice daily to help decrease pulmonary inflammation and see if this helps you with your cough and wheezing.  Be sure to brush her teeth following administration so you do not want it with thrush or cavities.  If your symptoms do not not improve, continue, or worsen you need to follow-up with pulmonology.     ED Prescriptions     Medication Sig Dispense Auth. Provider   azithromycin (ZITHROMAX Z-PAK) 250 MG tablet Take 1 tablet (250 mg total) by mouth daily. Take 2 tablets on the first day and then 1 tablet daily thereafter for a total of 5 days of treatment. 6 tablet Becky Augusta, NP   benzonatate (TESSALON) 100 MG capsule Take 2 capsules (200 mg total) by mouth every 8 (eight) hours. 21 capsule Becky Augusta, NP   promethazine-dextromethorphan (PROMETHAZINE-DM) 6.25-15 MG/5ML syrup Take 5 mLs by mouth 4 (four) times daily as needed. 118 mL Becky Augusta, NP   beclomethasone (QVAR REDIHALER) 40 MCG/ACT inhaler Inhale 2 puffs into the lungs 2 (two) times daily. 1 each Becky Augusta, NP      PDMP not reviewed this encounter.   Becky Augusta, NP 05/20/23 [phone]

## 2023-06-24 ENCOUNTER — Encounter: Payer: Self-pay | Admitting: Gastroenterology

## 2023-06-27 ENCOUNTER — Encounter: Payer: Self-pay | Admitting: Gastroenterology

## 2023-06-27 ENCOUNTER — Telehealth: Payer: Self-pay

## 2023-06-27 NOTE — Telephone Encounter (Signed)
The authorization has been submitted and awaiting response from [employer] via [email or web address]

## 2023-06-30 NOTE — Telephone Encounter (Signed)
Appeal with notes faxed to [phone]

## 2023-07-01 NOTE — Telephone Encounter (Signed)
Insurance calling wanting to talk to you about the appeal for Dexilant....   Please call [phone] ext. [phone].... I could not get the name as the representative was talking too low on the VM

## 2023-07-07 MED ORDER — DEXLANSOPRAZOLE 60 MG PO CPDR: 60.0000 mg | DELAYED_RELEASE_CAPSULE | Freq: Every day | ORAL | 0 refills | Status: DC

## 2023-07-07 NOTE — Addendum Note (Signed)
Addended by: Roena Malady on: 07/07/2023 05:10 PM   Modules accepted: Orders

## 2023-09-29 ENCOUNTER — Ambulatory Visit: Payer: 59 | Admitting: Gastroenterology

## 2023-09-29 ENCOUNTER — Encounter: Payer: Self-pay | Admitting: Gastroenterology

## 2023-09-29 VITALS — BP 171/119 | HR 111 | Temp 98.4°F | Wt 240.0 lb

## 2023-09-29 DIAGNOSIS — R197 Diarrhea, unspecified: Secondary | ICD-10-CM | POA: Diagnosis not present

## 2023-09-29 MED ORDER — ONDANSETRON 4 MG PO TBDP: 4.0000 mg | ORAL_TABLET | Freq: Three times a day (TID) | ORAL | 1 refills | Status: DC | PRN

## 2023-09-29 MED ORDER — DEXLANSOPRAZOLE 60 MG PO CPDR: 60.0000 mg | DELAYED_RELEASE_CAPSULE | Freq: Every day | ORAL | 3 refills | Status: DC

## 2023-09-29 MED ORDER — COLESEVELAM HCL 625 MG PO TABS: 625.0000 mg | ORAL_TABLET | Freq: Two times a day (BID) | ORAL | 3 refills | Status: DC

## 2023-09-29 NOTE — Progress Notes (Signed)
Primary Care Physician: Patient, No Pcp Per  Primary Gastroenterologist:  Dr. Midge Minium  Chief Complaint  Patient presents with   Follow-up    HPI: Ashley Floyd is a 52 y.o. female here for follow-up and need for her medication to be refilled.  The patient has had a hard time getting her Dexilant that has tried other PPIs without help.  The patient's Dexilant has worked for her in the past and she has done very well on it.  The patient had a colonoscopy on January 2023 with multiple polyps found with concern for these polyps and a repeat requested in 1 years time. The last time the patient was here we talked about checking fecal elastase since she has continued diarrhea.  The patient states that the cholestyramine powder was unpalatable and she cannot drink it.  Past Medical History:  Diagnosis Date   Anxiety    a. per epic care everywhere & patient   Arthritis    Toes, knees   Bipolar affective disorder (HCC)    Complication of anesthesia    PT STATES SHE HAS A SMALL MOUTH AND HAS HAD DENTAL WORK ON HER 2 FRONT TEETH AND SHE WANTS EXTRA CARE TAKEN FOR HER TEETH   Depression    Dysmenorrhea    Endometriosis    Family history of adverse reaction to anesthesia    PTS MOM HAS TROUBLE WITH ANESTHESIA DUE TO HER SMOKING HISTORY   GERD (gastroesophageal reflux disease)    Headache    sinus/stress/BP   Hemorrhoid    Herpes genitalis    History of mammogram 03/22/2015   BI-RADS 1 AT [facility]   History of nuclear stress test    a. 08/2015: no EKG changes concerning for ischemia, normal wall motion, EF >60%, low risk study   History of Papanicolaou smear of cervix 03/08/2015   -/-   Hypertension    IBS (irritable bowel syndrome)    Infertility, female    [facility] PT IN THE PAST   Lichen sclerosus    Obesity    Ovarian cyst 1998   right   PMDD (premenstrual dysphoric disorder)    PONV (postoperative nausea and vomiting)    Shortness of breath dyspnea    Sinus infection    Sleep  apnea    "MILD"-NO CPAP-LAST SLEEP STUDY YEARS AGO   Vertigo    rare    Current Outpatient Medications  Medication Sig Dispense Refill   albuterol (VENTOLIN HFA) 108 (90 Base) MCG/ACT inhaler Inhale 2 puffs into the lungs every 4 (four) hours as needed. 6.7 g 0   ALREX 0.2 % SUSP USE 1 DROP(S) IN BOTH EYES 4 TIMES A DAY AS NEEDED [PRN]     beclomethasone (QVAR REDIHALER) 40 MCG/ACT inhaler Inhale 2 puffs into the lungs 2 (two) times daily. 1 each 0   carbamazepine (TEGRETOL) 200 MG tablet Take 800 mg by mouth at bedtime.     clobetasol ointment (TEMOVATE) 0.05 % Apply to affected area 2-3 times wkly as maintenance 60 g 1   clotrimazole (MYCELEX) 10 MG troche      colesevelam (WELCHOL) 625 MG tablet Take 1 tablet (625 mg total) by mouth 2 (two) times daily with a meal. 180 tablet 3   cyclobenzaprine (FLEXERIL) 10 MG tablet Take 10 mg by mouth 2 (two) times daily as needed.     desonide (DESOWEN) 0.05 % cream Apply topically 2 (two) times daily. 60 g 1   dexlansoprazole (DEXILANT) 60 MG  capsule Take 1 capsule (60 mg total) by mouth daily. 90 capsule 0   diphenhydrAMINE (BENADRYL) 25 MG tablet Take 25 mg by mouth at bedtime as needed. Reported on 05/23/2016     hydrALAZINE (APRESOLINE) 25 MG tablet Take 1 tablet (25 mg total) by mouth 2 (two) times daily as needed (high pressure, headache). 180 tablet 3   losartan-hydrochlorothiazide (HYZAAR) 50-12.5 MG tablet Take 1 tablet by mouth daily. 90 tablet 3   metoprolol tartrate (LOPRESSOR) 25 MG tablet Take 1 tablet (25 mg total) by mouth 2 (two) times daily as needed (tachycardia). 180 tablet 3   naproxen (NAPROSYN) 500 MG tablet Take 500 mg by mouth 2 (two) times daily as needed.     nystatin (MYCOSTATIN) 100000 UNIT/ML suspension Take by mouth.     olopatadine (PATANOL) 0.1 % ophthalmic solution Place 1 drop into both eyes 4 (four) times daily as needed.      ondansetron (ZOFRAN-ODT) 4 MG disintegrating tablet Take 1 tablet (4 mg total) by mouth  every 8 (eight) hours as needed for nausea or vomiting. 20 tablet 1   terconazole (TERAZOL 7) 0.4 % vaginal cream AAA BID prn sx 45 g 1   triamcinolone (NASACORT) 55 MCG/ACT AERO nasal inhaler Place 2 sprays into the nose daily. Reported on 05/23/2016     valACYclovir (VALTREX) 500 MG tablet TAKE 1 TABLET BY MOUTH 2  TIMES DAILY FOR 3 DAYS AS  NEEDED FOR SYMPTOMS 30 tablet 0   zaleplon (SONATA) 10 MG capsule Take 10 mg by mouth daily.     clonazePAM (KLONOPIN) 1 MG tablet Take 1 tablet (1 mg total) by mouth at bedtime as needed for up to 5 days for anxiety. 5 tablet 0   No current facility-administered medications for this visit.    Allergies as of 09/29/2023 - Review Complete 05/20/2023  Allergen Reaction Noted   Cephalosporins Rash and Itching 03/22/2015   Progesterone  10/18/2020   Adhesive [tape] Rash 11/30/2015   Cefdinir Itching 07/27/2015   Penicillins Rash and Other (See Comments) 03/22/2015    ROS:  General: Negative for anorexia, weight loss, fever, chills, fatigue, weakness. ENT: Negative for hoarseness, difficulty swallowing , nasal congestion. CV: Negative for chest pain, angina, palpitations, dyspnea on exertion, peripheral edema.  Respiratory: Negative for dyspnea at rest, dyspnea on exertion, cough, sputum, wheezing.  GI: See history of present illness. GU:  Negative for dysuria, hematuria, urinary incontinence, urinary frequency, nocturnal urination.  Endo: Negative for unusual weight change.    Physical Examination:   BP (!) 171/119 (BP Location: Right Arm, Patient Position: Sitting, Cuff Size: Large)   Pulse (!) 111   Temp 98.4 F (36.9 C) (Oral)   Wt 240 lb (108.9 kg)   LMP 11/15/2019   BMI 41.20 kg/m   General: Well-nourished, well-developed in no acute distress.  Eyes: No icterus. Conjunctivae pink. Neuro: Alert and oriented x 3.  Grossly intact. Psych: Alert and cooperative, normal mood and affect.  Labs:    Imaging Studies: No results  found.  Assessment and Plan:   Ashley Floyd is a 52 y.o. y/o female who comes in today with a history of diarrhea with inability to tolerate the cholestyramine.  The patient needs a refill of her Dexilant, Zofran and will be given a prescription for WelChol to see if she can handle that better than the cholestyramine.  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  The patient has  been explained the plan and agrees with it.   Midge Minium, MD. Clementeen Graham    Note: This dictation was prepared with Dragon dictation along with smaller phrase technology. Any transcriptional errors that result from this process are unintentional.

## 2023-10-16 LAB — PANCREATIC ELASTASE, FECAL: Pancreatic Elastase, Fecal: >800 ug Elast./g (ref >200)

## 2023-10-17 ENCOUNTER — Encounter: Payer: Self-pay | Admitting: Gastroenterology

## 2023-10-22 LAB — HM MAMMOGRAPHY

## 2023-10-27 ENCOUNTER — Encounter: Payer: Self-pay | Admitting: Obstetrics and Gynecology

## 2023-11-13 ENCOUNTER — Ambulatory Visit: Payer: 59 | Admitting: Obstetrics and Gynecology

## 2023-12-09 NOTE — Progress Notes (Signed)
Chief Complaint  Patient presents with   Gynecologic Exam    No concerns     HPI:      Ashley Floyd is a 53 y.o. G1P0010 who LMP was Patient's last menstrual period was 11/15/2019., presents today for her annual examination. Her menses are absent now since 12/20. No PMB. She has a hx of endometriosis and ovarian cysts. She has vasomotor sx that are improved.  Pt with hx of lichen sclerosis sx and saw derm. Treats with clobetasol oint prn sx, usually for a few days at a time, a few times a yr. Has had increased sx recently. Pt needs Rx RF. Also treats with terconazole crm and desonide for tinea under bilat breasts, inguinal areas, panus. Tried to keep dry but doesn't like powders.  Needs Rx RF.   Sex activity: not sex active; hx of infertility. Last Pap: 07/30/22 Results were: no abnormalities /neg HPV DNA  Hx of STDs: HSV, oral and genital and takes valtrex a few times a year prn sx. Needs Rx RF today.   Last mammogram: 10/22/23 at [facility].  Results were: normal--routine follow-up in 12 months There is no FH of breast cancer. There is no FH of ovarian cancer. The patient does not do self-breast exams.   Tobacco use: The patient denies current or previous tobacco use. Alcohol use: none No drug use Exercise: not active; hx of arthritis in toe that is very painful with exercise  Colonoscopy: 1/23 with Dr. Servando Snare with polyps, repeat due after 1 yr; not done yet. Had colonoscopy 12/21 with precancerousp polyps; repeat done after 6 months with Dr. Servando Snare. Was diagnosed with internal hemorrhoids. Hx of constipation/diarrhea/IBS.   She does not get adequate calcium or Vitamin D supp.  Was getting labs with psych and cardio MD but not seeing anymore. Not taking HTN meds due to side effects, that could have also been withdrawal sx from stopping clonazepam due to pharmacy issue. Not taking meds for awhile. Doesn't have PCP.   Long hx of anxiety/depression. 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     Past Medical History:  Diagnosis Date   Anxiety    a. per epic care everywhere & patient   Arthritis    Toes, knees   Bipolar affective disorder (HCC)    Complication of anesthesia    PT STATES SHE HAS A SMALL MOUTH AND HAS HAD DENTAL WORK ON HER 2 FRONT TEETH AND SHE WANTS EXTRA CARE TAKEN FOR HER TEETH   Depression    Dysmenorrhea    Endometriosis    Family history of adverse reaction to anesthesia    PTS MOM HAS TROUBLE WITH ANESTHESIA DUE TO HER SMOKING HISTORY   GERD (gastroesophageal reflux disease)    Headache    sinus/stress/BP   Hemorrhoid    Herpes genitalis    History of mammogram 03/22/2015   BI-RADS 1 AT [facility]   History of nuclear stress test    a. 08/2015: no EKG changes concerning for ischemia, normal wall motion, EF >60%, low risk study   History of Papanicolaou smear of cervix 03/08/2015   -/-   Hypertension    IBS (irritable bowel syndrome)    Infertility, female    [facility] PT IN THE PAST   Lichen sclerosus    Obesity    Ovarian cyst 1998   right   PMDD (premenstrual dysphoric disorder)    PONV (postoperative nausea and vomiting)    Shortness of breath dyspnea  Sinus infection    Sleep apnea    "MILD"-NO CPAP-LAST SLEEP STUDY YEARS AGO   Vertigo    rare    Past Surgical History:  Procedure Laterality Date   CHOLECYSTECTOMY N/A 12/07/2015   Procedure: LAPAROSCOPIC CHOLECYSTECTOMY ;  Surgeon: Earline Mayotte, MD;  Location: [facility] ORS;  Service: General;  Laterality: N/A;   COLONOSCOPY  11/2022   COLONOSCOPY WITH PROPOFOL N/A 10/27/2020   Procedure: COLONOSCOPY WITH BIOPSY;  Surgeon: Midge Minium, MD;  Location: [facility];  Service: Endoscopy;  Laterality: N/A;  sleep apnea   COLONOSCOPY WITH PROPOFOL N/A 11/30/2021   Procedure: COLONOSCOPY WITH PROPOFOL;  Surgeon: Midge Minium, MD;  Location: [facility];  Service: Endoscopy;  Laterality: N/A;   DIAGNOSTIC LAPAROSCOPY  1998; 2006   OSIS; OVARIAN CYST (2006 AT [facility])   OVARIAN  CYST REMOVAL     POLYPECTOMY N/A 10/27/2020   Procedure: POLYPECTOMY;  Surgeon: Midge Minium, MD;  Location: [facility];  Service: Endoscopy;  Laterality: N/A;  Clip x 2 placed at Cecal Polyp Removal   POLYPECTOMY N/A 11/30/2021   Procedure: POLYPECTOMY;  Surgeon: Midge Minium, MD;  Location: [facility];  Service: Endoscopy;  Laterality: N/A;   UPPER GI ENDOSCOPY  2015   Dr Servando Snare   URETHRAL DILATION      Family History  Problem Relation Age of Onset   Arrhythmia Mother    Cancer Mother        MELANOMA OF SKIN   Broken bones Mother        neck   Hypertension Maternal Grandmother    Hypertension Maternal Grandfather    Heart disease Paternal Grandmother    Heart attack Paternal Grandmother    Diabetes Paternal Grandmother    Diabetes Father    Cancer Paternal Grandfather        MAL NEO OF SKIN; LUNGS   Heart failure Maternal Aunt    Heart disease Maternal Aunt        CABG   Diabetes Paternal Aunt    Diabetes Other     Social History   Socioeconomic History   Marital status: Married    Spouse name: Not on file   Number of children: 0   Years of education: 16   Highest education level: Not on file  Occupational History   Not on file  Tobacco Use   Smoking status: Never   Smokeless tobacco: Never  Vaping Use   Vaping status: Never Used  Substance and Sexual Activity   Alcohol use: Not Currently    Alcohol/week: 0.0 standard drinks of alcohol    Comment: rarely   Drug use: No   Sexual activity: Not Currently    Birth control/protection: Post-menopausal  Other Topics Concern   Not on file  Social History Narrative   Not on file   Social Drivers of Health   Financial Resource Strain: Not on file  Food Insecurity: Not on file  Transportation Needs: Not on file  Physical Activity: Not on file  Stress: Not on file  Social Connections: Not on file  Intimate Partner Violence: Not on file    Current Outpatient Medications on File Prior to Visit   Medication Sig Dispense Refill   albuterol (VENTOLIN HFA) 108 (90 Base) MCG/ACT inhaler Inhale 2 puffs into the lungs every 4 (four) hours as needed. 6.7 g 0   ALREX 0.2 % SUSP USE 1 DROP(S) IN BOTH EYES 4 TIMES A DAY AS NEEDED [PRN]  beclomethasone (QVAR REDIHALER) 40 MCG/ACT inhaler Inhale 2 puffs into the lungs 2 (two) times daily. 1 each 0   carbamazepine (TEGRETOL) 200 MG tablet Take 800 mg by mouth at bedtime.     cyclobenzaprine (FLEXERIL) 10 MG tablet Take 10 mg by mouth 2 (two) times daily as needed.     dexlansoprazole (DEXILANT) 60 MG capsule Take 1 capsule (60 mg total) by mouth daily. 90 capsule 3   diphenhydrAMINE (BENADRYL) 25 MG tablet Take 25 mg by mouth at bedtime as needed. Reported on 05/23/2016     hydrALAZINE (APRESOLINE) 25 MG tablet Take 1 tablet (25 mg total) by mouth 2 (two) times daily as needed (high pressure, headache). 180 tablet 3   metoprolol tartrate (LOPRESSOR) 25 MG tablet Take 1 tablet (25 mg total) by mouth 2 (two) times daily as needed (tachycardia). 180 tablet 3   naproxen (NAPROSYN) 500 MG tablet Take 500 mg by mouth 2 (two) times daily as needed.     nystatin (MYCOSTATIN) 100000 UNIT/ML suspension Take by mouth.     olopatadine (PATANOL) 0.1 % ophthalmic solution Place 1 drop into both eyes 4 (four) times daily as needed.      ondansetron (ZOFRAN-ODT) 4 MG disintegrating tablet Take 1 tablet (4 mg total) by mouth every 8 (eight) hours as needed for nausea or vomiting. 20 tablet 1   zaleplon (SONATA) 10 MG capsule Take 10 mg by mouth daily.     clonazePAM (KLONOPIN) 1 MG tablet Take 1 tablet (1 mg total) by mouth at bedtime as needed for up to 5 days for anxiety. 5 tablet 0   colesevelam (WELCHOL) 625 MG tablet Take 1 tablet (625 mg total) by mouth 2 (two) times daily with a meal. (Patient not taking: Reported on 12/11/2023) 180 tablet 3   No current facility-administered medications on file prior to visit.      ROS:  Review of Systems   Constitutional:  Positive for fatigue. Negative for fever and unexpected weight change.  Respiratory:  Negative for cough, shortness of breath and wheezing.   Cardiovascular:  Negative for chest pain, palpitations and leg swelling.  Gastrointestinal:  Positive for diarrhea. Negative for blood in stool, constipation, nausea and vomiting.  Endocrine: Negative for cold intolerance, heat intolerance and polyuria.  Genitourinary:  Negative for dyspareunia, dysuria, flank pain, frequency, genital sores, hematuria, menstrual problem, pelvic pain, urgency, vaginal bleeding, vaginal discharge and vaginal pain.  Musculoskeletal:  Positive for arthralgias. Negative for back pain, joint swelling and myalgias.  Skin:  Positive for rash.  Neurological:  Positive for headaches. Negative for dizziness, syncope, light-headedness and numbness.  Hematological:  Negative for adenopathy.  Psychiatric/Behavioral:  Positive for agitation and dysphoric mood. Negative for confusion, sleep disturbance and suicidal ideas. The patient is not nervous/anxious.      Objective: BP (!) 161/84   Pulse (!) 118   Ht 5\' 4"  (1.626 m)   Wt 243 lb (110.2 kg)   LMP 11/15/2019   BMI 41.71 kg/m    Physical Exam Constitutional:      Appearance: She is well-developed.  Genitourinary:     Vulva normal.     Right Labia: tenderness.     Right Labia: No rash or lesions.    Left Labia: tenderness.     Left Labia: No lesions or rash.       No vaginal discharge, erythema or tenderness.     Moderate vaginal atrophy present.     Right Adnexa: not tender and no mass  present.    Left Adnexa: not tender and no mass present.    No cervical friability or polyp.     Uterus is not enlarged or tender.  Rectum:     Guaiac result negative.     External hemorrhoid present.     No tenderness.  Breasts:    Right: No mass, nipple discharge, skin change or tenderness.     Left: No mass, nipple discharge, skin change or tenderness.   Neck:     Thyroid: No thyromegaly.  Cardiovascular:     Rate and Rhythm: Normal rate and regular rhythm.     Heart sounds: Normal heart sounds. No murmur heard. Pulmonary:     Effort: Pulmonary effort is normal.     Breath sounds: Normal breath sounds.  Abdominal:     Palpations: Abdomen is soft.     Tenderness: There is no abdominal tenderness. There is no guarding or rebound.  Musculoskeletal:        General: Normal range of motion.     Cervical back: Normal range of motion.  Lymphadenopathy:     Cervical: No cervical adenopathy.  Neurological:     General: No focal deficit present.     Mental Status: She is alert and oriented to person, place, and time.     Cranial Nerves: No cranial nerve deficit.  Skin:    General: Skin is warm and dry.  Psychiatric:        Mood and Affect: Mood normal.        Behavior: Behavior normal.        Thought Content: Thought content normal.        Judgment: Judgment normal.  Vitals reviewed.    Assessment/Plan: Encounter for annual routine gynecological examination  Encounter for screening mammogram for malignant neoplasm of breast; pt current on mammo  Screening for colon cancer - Plan: Ambulatory referral to Gastroenterology  Lichen sclerosus - Plan: clobetasol ointment (TEMOVATE) 0.05 %; Rx RF. Needs to restart tx at bedtime for 4 wks, then QOHS for 4 wks, then 1-2 times weekly. Discussed tx isn't oral and risk of SCC if untreated.   Herpes simplex vulvovaginitis - Plan: valACYclovir (VALTREX) 500 MG tablet; Rx RF prn sx.   Tinea corporis - Plan: desonide (DESOWEN) 0.05 % cream, terconazole (TERAZOL 7) 0.4 % vaginal cream; Rx Rfs eRxd. Pt doing antifungal dry spray powder with some prevention of sx.    Essential hypertension - Plan: losartan-hydrochlorothiazide (HYZAAR) 50-12.5 MG tablet; Rx RF to restart. Pt needs to find new PCP for mgmt/labs. They can change meds if she has side effects.    Meds ordered this encounter  Medications    losartan-hydrochlorothiazide (HYZAAR) 50-12.5 MG tablet    Sig: Take 1 tablet by mouth daily.    Dispense:  30 tablet    Refill:  0   clobetasol ointment (TEMOVATE) 0.05 %    Sig: Apply to affected area at bedtime for 4 wks, then QOHS for 4 wks, then 1-2 times weekly as maintenance    Dispense:  60 g    Refill:  1   desonide (DESOWEN) 0.05 % cream    Sig: Apply topically 2 (two) times daily.    Dispense:  60 g    Refill:  1   terconazole (TERAZOL 7) 0.4 % vaginal cream    Sig: AAA BID prn sx    Dispense:  45 g    Refill:  1   valACYclovir (VALTREX) 500 MG tablet  Sig: TAKE 1 TABLET BY MOUTH 2  TIMES DAILY FOR 3 DAYS AS  NEEDED FOR SYMPTOMS    Dispense:  30 tablet    Refill:  1              GYN counsel breast self exam, mammography screening, menopause, adequate intake of calcium and vitamin D, diet and exercise     F/U  Return in about 1 year (around 12/10/2024).  Ashley Knobel B. Kanin Lia, PA-C 12/11/2023 4:53 PM

## 2023-12-11 ENCOUNTER — Other Ambulatory Visit: Payer: Self-pay | Admitting: Obstetrics and Gynecology

## 2023-12-11 ENCOUNTER — Encounter: Payer: Self-pay | Admitting: Obstetrics and Gynecology

## 2023-12-11 ENCOUNTER — Ambulatory Visit (INDEPENDENT_AMBULATORY_CARE_PROVIDER_SITE_OTHER): Payer: 59 | Admitting: Obstetrics and Gynecology

## 2023-12-11 ENCOUNTER — Ambulatory Visit (INDEPENDENT_AMBULATORY_CARE_PROVIDER_SITE_OTHER): Payer: 59

## 2023-12-11 ENCOUNTER — Ambulatory Visit
Admission: EM | Admit: 2023-12-11 | Discharge: 2023-12-11 | Disposition: A | Discharge status: Home / Self Care | Payer: 59 | Attending: Family Medicine | Admitting: Family Medicine

## 2023-12-11 VITALS — BP 161/84 | HR 118 | Ht 64.0 in | Wt 243.0 lb

## 2023-12-11 DIAGNOSIS — Z01419 Encounter for gynecological examination (general) (routine) without abnormal findings: Secondary | ICD-10-CM

## 2023-12-11 DIAGNOSIS — J209 Acute bronchitis, unspecified: Secondary | ICD-10-CM

## 2023-12-11 DIAGNOSIS — I10 Essential (primary) hypertension: Secondary | ICD-10-CM | POA: Diagnosis not present

## 2023-12-11 DIAGNOSIS — R051 Acute cough: Secondary | ICD-10-CM

## 2023-12-11 DIAGNOSIS — Z01810 Encounter for preprocedural cardiovascular examination: Secondary | ICD-10-CM | POA: Diagnosis not present

## 2023-12-11 DIAGNOSIS — A6004 Herpesviral vulvovaginitis: Secondary | ICD-10-CM

## 2023-12-11 DIAGNOSIS — L900 Lichen sclerosus et atrophicus: Secondary | ICD-10-CM

## 2023-12-11 DIAGNOSIS — Z1211 Encounter for screening for malignant neoplasm of colon: Secondary | ICD-10-CM

## 2023-12-11 DIAGNOSIS — B354 Tinea corporis: Secondary | ICD-10-CM

## 2023-12-11 DIAGNOSIS — R000 Tachycardia, unspecified: Secondary | ICD-10-CM

## 2023-12-11 DIAGNOSIS — Z1231 Encounter for screening mammogram for malignant neoplasm of breast: Secondary | ICD-10-CM

## 2023-12-11 MED ORDER — PROMETHAZINE-DM 6.25-15 MG/5ML PO SYRP: 5.0000 mL | ORAL_SOLUTION | Freq: Four times a day (QID) | ORAL | 0 refills | Status: DC | PRN

## 2023-12-11 MED ORDER — DESONIDE 0.05 % EX CREA: TOPICAL_CREAM | Freq: Two times a day (BID) | CUTANEOUS | 1 refills | Status: DC

## 2023-12-11 MED ORDER — FLUCONAZOLE 150 MG PO TABS: 150.0000 mg | ORAL_TABLET | Freq: Once | ORAL | 0 refills | Status: AC

## 2023-12-11 MED ORDER — PREDNISONE 10 MG (21) PO TBPK: ORAL_TABLET | Freq: Every day | ORAL | 0 refills | Status: DC

## 2023-12-11 MED ORDER — VALACYCLOVIR HCL 500 MG PO TABS: ORAL_TABLET | ORAL | 1 refills | Status: DC

## 2023-12-11 MED ORDER — LOSARTAN POTASSIUM-HCTZ 50-12.5 MG PO TABS
1.0000 | ORAL_TABLET | Freq: Every day | ORAL | 0 refills | Status: DC
Start: 2023-12-11 — End: 2024-04-13

## 2023-12-11 MED ORDER — CLOBETASOL PROPIONATE 0.05 % EX OINT: TOPICAL_OINTMENT | CUTANEOUS | 1 refills | Status: DC

## 2023-12-11 MED ORDER — AZITHROMYCIN 250 MG PO TABS: ORAL_TABLET | ORAL | 0 refills | Status: DC

## 2023-12-11 MED ORDER — TERCONAZOLE 0.4 % VA CREA: TOPICAL_CREAM | VAGINAL | 1 refills | Status: DC

## 2023-12-11 NOTE — Discharge Instructions (Addendum)
Stop by the pharmacy to pick up your prescriptions.  Follow up with your new primary care provider, Tillie Fantasia upstairs in this building on 02/19/2024 at 2 PM.  Arrive about 1:45 PM to complete your new patient paperwork.  Monitor your blood pressures over the next couple of weeks and follow-up with your new primary care doctor.   Go to ED for red flag symptoms, including; fevers you cannot reduce with Tylenol/Motrin, severe headaches, vision changes, numbness/weakness in part of the body, lethargy, confusion, intractable vomiting, severe dehydration, chest pain, breathing difficulty, severe persistent abdominal or pelvic pain, signs of severe infection (increased redness, swelling of an area), feeling faint or passing out, dizziness, etc. You should especially go to the ED for sudden acute worsening of condition if you do not elect to go at this time.

## 2023-12-11 NOTE — Patient Instructions (Signed)
I value your feedback and you entrusting us with your care. If you get a [facility] patient survey, I would appreciate you taking the time to let us know about your experience today. Thank you! ? ? ?

## 2023-12-11 NOTE — ED Provider Notes (Signed)
[facility]    CSN: [id number] Arrival date & time: 12/11/23  1829      History   Chief Complaint Chief Complaint  Patient presents with   Cough    HPI Ashley Floyd is a 53 y.o. female.   HPI  History obtained from the patient. Ashley Floyd presents for cough for the past 8 days. There is pain in the bottom right lung that is aching and she is concerning she has pneumonia or forceful coughing.  Has been using an inhaler, tessalon Perles and NyQuil that isn't helping. Has been having popping, crackling and wheezing. She has not been sleeping well. Tmax 102.1 F the first couple of days. Took COVID and influenza home testing that was negative. She has not been able to taste or smell. She has been taking care of her mom and neglecting her health.       Past Medical History:  Diagnosis Date   Anxiety    a. per epic care everywhere & patient   Arthritis    Toes, knees   Bipolar affective disorder (HCC)    Complication of anesthesia    PT STATES SHE HAS A SMALL MOUTH AND HAS HAD DENTAL WORK ON HER 2 FRONT TEETH AND SHE WANTS EXTRA CARE TAKEN FOR HER TEETH   Depression    Dysmenorrhea    Endometriosis    Family history of adverse reaction to anesthesia    PTS MOM HAS TROUBLE WITH ANESTHESIA DUE TO HER SMOKING HISTORY   GERD (gastroesophageal reflux disease)    Headache    sinus/stress/BP   Hemorrhoid    Herpes genitalis    History of mammogram 03/22/2015   BI-RADS 1 AT [facility]   History of nuclear stress test    a. 08/2015: no EKG changes concerning for ischemia, normal wall motion, EF >60%, low risk study   History of Papanicolaou smear of cervix 03/08/2015   -/-   Hypertension    IBS (irritable bowel syndrome)    Infertility, female    [facility] PT IN THE PAST   Lichen sclerosus    Obesity    Ovarian cyst 1998   right   PMDD (premenstrual dysphoric disorder)    PONV (postoperative nausea and vomiting)    Shortness of breath dyspnea    Sinus infection    Sleep  apnea    "MILD"-NO CPAP-LAST SLEEP STUDY YEARS AGO   Vertigo    rare    Patient Active Problem List   Diagnosis Date Noted   History of colonic polyps    Tinea corporis 12/26/2020   Hematochezia 10/28/2020   H/O colonoscopy with polypectomy 10/26/20 10/28/2020   Colon cancer screening    Polyp of ascending colon    Perimenopausal vasomotor symptoms 06/08/2020   Strain of lumbar region 08/17/2019   Perimenopause 05/03/2019   Allergic rhinitis 05/15/2017   Endometriosis 05/15/2017   Acid reflux 05/15/2017   Brash 05/15/2017   Adaptive colitis 05/15/2017   Cannot sleep 05/15/2017   Lichen sclerosus 03/24/2017   Genital herpes 03/24/2017   Preoperative cardiovascular examination 10/26/2015   Gallstones 10/23/2015   Opacity of lung on imaging study 10/16/2015   Epigastric pain 10/04/2015   Atypical chest pain 09/22/2015   Anxiety    IBS (irritable bowel syndrome) 07/26/2015   Obesity, Class II, BMI 35-39.9 07/26/2015   Pain in the chest 03/22/2015   SOB (shortness of breath) on exertion 03/22/2015   Bilateral leg edema 03/22/2015   Obesity, Class III, BMI  40-49.9 (morbid obesity) (HCC) 03/22/2015   Clinical depression 03/22/2015   Affective bipolar disorder (HCC) 03/22/2015   Essential hypertension 03/22/2015    Past Surgical History:  Procedure Laterality Date   CHOLECYSTECTOMY N/A 12/07/2015   Procedure: LAPAROSCOPIC CHOLECYSTECTOMY ;  Surgeon: Earline Mayotte, MD;  Location: [facility];  Service: General;  Laterality: N/A;   COLONOSCOPY  11/2022   COLONOSCOPY WITH PROPOFOL N/A 10/27/2020   Procedure: COLONOSCOPY WITH BIOPSY;  Surgeon: Midge Minium, MD;  Location: [facility];  Service: Endoscopy;  Laterality: N/A;  sleep apnea   COLONOSCOPY WITH PROPOFOL N/A 11/30/2021   Procedure: COLONOSCOPY WITH PROPOFOL;  Surgeon: Midge Minium, MD;  Location: [facility];  Service: Endoscopy;  Laterality: N/A;   DIAGNOSTIC LAPAROSCOPY  1998; 2006   OSIS; OVARIAN  CYST (2006 AT [facility])   OVARIAN CYST REMOVAL     POLYPECTOMY N/A 10/27/2020   Procedure: POLYPECTOMY;  Surgeon: Midge Minium, MD;  Location: [facility];  Service: Endoscopy;  Laterality: N/A;  Clip x 2 placed at Cecal Polyp Removal   POLYPECTOMY N/A 11/30/2021   Procedure: POLYPECTOMY;  Surgeon: Midge Minium, MD;  Location: [facility];  Service: Endoscopy;  Laterality: N/A;   UPPER GI ENDOSCOPY  2015   Dr Servando Snare   URETHRAL DILATION      OB History     Gravida  1   Para  0   Term  0   Preterm  0   AB  1   Living  0      SAB  0   IAB  0   Ectopic  0   Multiple  0   Live Births           Obstetric Comments  1st Menstrual Cycle:  10           Home Medications    Prior to Admission medications   Medication Sig Start Date End Date Taking? Authorizing Provider  albuterol (VENTOLIN HFA) 108 (90 Base) MCG/ACT inhaler Inhale 2 puffs into the lungs every 4 (four) hours as needed. 05/14/23  Yes Tatiyana Foucher, DO  ALREX 0.2 % SUSP USE 1 DROP(S) IN BOTH EYES 4 TIMES A DAY AS NEEDED [PRN] 11/28/18  Yes [provider]  azithromycin (ZITHROMAX Z-PAK) 250 MG tablet Take 2 tablets on day 1 then 1 tablet daily 12/11/23  Yes Pao Haffey, DO  beclomethasone (QVAR REDIHALER) 40 MCG/ACT inhaler Inhale 2 puffs into the lungs 2 (two) times daily. 05/20/23  Yes Becky Augusta, NP  carbamazepine (TEGRETOL) 200 MG tablet Take 800 mg by mouth at bedtime.   Yes [provider]  clobetasol ointment (TEMOVATE) 0.05 % Apply to affected area at bedtime for 4 wks, then QOHS for 4 wks, then 1-2 times weekly as maintenance 12/11/23  Yes Copland, Helmut Muster B, PA-C  cyclobenzaprine (FLEXERIL) 10 MG tablet Take 10 mg by mouth 2 (two) times daily as needed. 12/01/20  Yes [provider]  desonide (DESOWEN) 0.05 % cream Apply topically 2 (two) times daily. 12/11/23  Yes Copland, Ilona Sorrel, PA-C  dexlansoprazole (DEXILANT) 60 MG capsule Take 1 capsule (60 mg total) by  mouth daily. 09/29/23  Yes Midge Minium, MD  diphenhydrAMINE (BENADRYL) 25 MG tablet Take 25 mg by mouth at bedtime as needed. Reported on 05/23/2016   Yes [provider]  fluconazole (DIFLUCAN) 150 MG tablet Take 1 tablet (150 mg total) by mouth once for 1 dose. 12/11/23 12/11/23 Yes Loneta Tamplin, DO  hydrALAZINE (APRESOLINE)  25 MG tablet Take 1 tablet (25 mg total) by mouth 2 (two) times daily as needed (high pressure, headache). 05/30/20  Yes Gollan, Tollie Pizza, MD  losartan-hydrochlorothiazide (HYZAAR) 50-12.5 MG tablet Take 1 tablet by mouth daily. 12/11/23  Yes Copland, Helmut Muster B, PA-C  metoprolol tartrate (LOPRESSOR) 25 MG tablet Take 1 tablet (25 mg total) by mouth 2 (two) times daily as needed (tachycardia). 05/30/20  Yes Gollan, Tollie Pizza, MD  naproxen (NAPROSYN) 500 MG tablet Take 500 mg by mouth 2 (two) times daily as needed.   Yes [provider]  nystatin (MYCOSTATIN) 100000 UNIT/ML suspension Take by mouth. 06/07/21  Yes [provider]  olopatadine (PATANOL) 0.1 % ophthalmic solution Place 1 drop into both eyes 4 (four) times daily as needed.  10/02/20  Yes [provider]  ondansetron (ZOFRAN-ODT) 4 MG disintegrating tablet Take 1 tablet (4 mg total) by mouth every 8 (eight) hours as needed for nausea or vomiting. 09/29/23  Yes Midge Minium, MD  predniSONE (STERAPRED UNI-PAK 21 TAB) 10 MG (21) TBPK tablet Take by mouth daily. Take 6 tabs by mouth daily for 1, then 5 tabs for 1 day, then 4 tabs for 1 day, then 3 tabs for 1 day, then 2 tabs for 1 day, then 1 tab for 1 day. 12/11/23  Yes Naftali Carchi, DO  promethazine-dextromethorphan (PROMETHAZINE-DM) 6.25-15 MG/5ML syrup Take 5 mLs by mouth 4 (four) times daily as needed. 12/11/23  Yes Janus Vlcek, Seward Meth, DO  terconazole (TERAZOL 7) 0.4 % vaginal cream AAA BID prn sx 12/11/23  Yes Copland, Alicia B, PA-C  valACYclovir (VALTREX) 500 MG tablet TAKE 1 TABLET BY MOUTH 2  TIMES DAILY FOR 3 DAYS AS  NEEDED FOR SYMPTOMS  12/11/23  Yes Copland, Alicia B, PA-C  zaleplon (SONATA) 10 MG capsule Take 10 mg by mouth daily.   Yes [provider]  clonazePAM (KLONOPIN) 1 MG tablet Take 1 tablet (1 mg total) by mouth at bedtime as needed for up to 5 days for anxiety. 01/09/22 01/14/22  Concha Se, MD  colesevelam (WELCHOL) 625 MG tablet Take 1 tablet (625 mg total) by mouth 2 (two) times daily with a meal. Patient not taking: Reported on 12/11/2023 09/29/23   Midge Minium, MD    Family History Family History  Problem Relation Age of Onset   Arrhythmia Mother    Cancer Mother        MELANOMA OF SKIN   Broken bones Mother        neck   Hypertension Maternal Grandmother    Hypertension Maternal Grandfather    Heart disease Paternal Grandmother    Heart attack Paternal Grandmother    Diabetes Paternal Grandmother    Diabetes Father    Cancer Paternal Grandfather        MAL NEO OF SKIN; LUNGS   Heart failure Maternal Aunt    Heart disease Maternal Aunt        CABG   Diabetes Paternal Aunt    Diabetes Other     Social History Social History   Tobacco Use   Smoking status: Never   Smokeless tobacco: Never  Vaping Use   Vaping status: Never Used  Substance Use Topics   Alcohol use: Not Currently    Alcohol/week: 0.0 standard drinks of alcohol    Comment: rarely   Drug use: No     Allergies   Cephalosporins, Progesterone, Adhesive [tape], Cefdinir, and Penicillins   Review of Systems Review of Systems: negative unless otherwise  stated in HPI.      Physical Exam Triage Vital Signs ED Triage Vitals  Encounter Vitals Group     BP --      Systolic BP Percentile --      Diastolic BP Percentile --      Pulse --      Resp --      Temp --      Temp src --      SpO2 --      Weight 12/11/23 1840 243 lb (110.2 kg)     Height 12/11/23 1840 5\' 4"  (1.626 m)     Head Circumference --      Peak Flow --      Pain Score 12/11/23 1839 3     Pain Loc --      Pain Education --      Exclude  from Growth Chart --    No data found.  Updated Vital Signs BP (!) 162/108 (BP Location: Left Arm)   Pulse (!) 124   Temp 98.2 F (36.8 C) (Oral)   Ht 5\' 4"  (1.626 m)   Wt 110.2 kg   LMP 11/15/2019   SpO2 96%   BMI 41.71 kg/m   Visual Acuity Right Eye Distance:   Left Eye Distance:   Bilateral Distance:    Right Eye Near:   Left Eye Near:    Bilateral Near:     Physical Exam GEN:     alert, non-toxic appearing female in no distress    HENT:  mucus membranes moist, no nasal discharge  RESP:  no increased work of breathing, coarse breath sounds with faint expiratory wheezing bilaterally, frequent productive cough CVS:   regular  rhythm, tachycardic Skin:   warm and dry    UC Treatments / Results  Labs (all labs ordered are listed, but only abnormal results are displayed) Labs Reviewed - No data to display  EKG   Radiology DG Chest 2 View Result Date: 12/11/2023 CLINICAL DATA:  Cough EXAM: CHEST - 2 VIEW COMPARISON:  X-ray 05/20/2023.  Older exams as well FINDINGS: No consolidation, pneumothorax or effusion. No edema. Normal cardiopericardial silhouette. Degenerative changes seen along the spine. Slight elevation of the right hemidiaphragm. Surgical clips in the upper abdomen. IMPRESSION: No acute cardiopulmonary disease. Electronically Signed   By: Karen Kays M.D.   On: 12/11/2023 19:51    Procedures Procedures (including critical care time)  Medications Ordered in UC Medications - No data to display  Initial Impression / Assessment and Plan / UC Course  I have reviewed the triage vital signs and the nursing notes.  Pertinent labs & imaging results that were available during my care of the patient were reviewed by me and considered in my medical decision making (see chart for details).      Pt is a 53 y.o. female who presents for 1-2 weeks of cough that is not improving.  Alesha is afebrile here. Satting well on room air. Overall pt is  non-toxic appearing,  well hydrated, without respiratory distress. Pulmonary exam is remarkable for faint expiratory wheezing bilaterally week with coarse breath sounds.  After shared decision making, we will pursue chest x-ray.  COVID  and influenza testing deferred due to length of symptoms. Home COVID and influenza antigen testing was negative.   Chest xray personally reviewed by me without focal pneumonia, pleural effusion, cardiomegaly or pneumothorax.  Radiologist impression reviewed.  Treat acute bronchitis with steroids and antibiotics as below. Promethazine DM cough  syrup given for cough and allow patient to rest.  Typical duration of symptoms discussed.  Continue using her inhaler.  Patient is tachycardic and hypertensive here.  EKG obtained showing sinus tachycardia, heart rate 117 without acute ST or T wave changes; personally interpreted by me.  She denies any shortness of breath.  EKG from 01/10/2022 showing NSR however on vital review she has been tachycardic multiple occasions greater than 110 since June 2024.  On chart review, she was just seen today in her OB/GYN's office and restarted on losartan HCTZ 50-12.5 mg daily.  She has not picked up this medication from the pharmacy.  She had been on her medication since the losartan recall years ago  Patient does not have a new primary care doctor.  She was scheduled for primary care per physician prior to discharge.  Return and ED precautions given and patient voiced understanding. Discussed MDM, treatment plan and plan for follow-up with patient who agrees with plan.    Final Clinical Impressions(s) / UC Diagnoses   Final diagnoses:  Elevated blood pressure reading with diagnosis of hypertension  Tachycardia, unspecified  Acute bronchitis, unspecified organism     Discharge Instructions      Stop by the pharmacy to pick up your prescriptions.  Follow up with your new primary care provider, Tillie Fantasia upstairs in this building on 02/19/2024 at 2  PM.  Arrive about 1:45 PM to complete your new patient paperwork.  Monitor your blood pressures over the next couple of weeks and follow-up with your new primary care doctor.   Go to ED for red flag symptoms, including; fevers you cannot reduce with Tylenol/Motrin, severe headaches, vision changes, numbness/weakness in part of the body, lethargy, confusion, intractable vomiting, severe dehydration, chest pain, breathing difficulty, severe persistent abdominal or pelvic pain, signs of severe infection (increased redness, swelling of an area), feeling faint or passing out, dizziness, etc. You should especially go to the ED for sudden acute worsening of condition if you do not elect to go at this time.        ED Prescriptions     Medication Sig Dispense Auth. Provider   promethazine-dextromethorphan (PROMETHAZINE-DM) 6.25-15 MG/5ML syrup Take 5 mLs by mouth 4 (four) times daily as needed. 118 mL Kade Rickels, DO   predniSONE (STERAPRED UNI-PAK 21 TAB) 10 MG (21) TBPK tablet Take by mouth daily. Take 6 tabs by mouth daily for 1, then 5 tabs for 1 day, then 4 tabs for 1 day, then 3 tabs for 1 day, then 2 tabs for 1 day, then 1 tab for 1 day. 21 tablet Olumide Dolinger, DO   azithromycin (ZITHROMAX Z-PAK) 250 MG tablet Take 2 tablets on day 1 then 1 tablet daily 6 tablet Thuan Tippett, DO   fluconazole (DIFLUCAN) 150 MG tablet Take 1 tablet (150 mg total) by mouth once for 1 dose. 1 tablet Katha Cabal, DO      PDMP not reviewed this encounter.   Katha Cabal, DO 12/11/23 2006

## 2023-12-11 NOTE — ED Triage Notes (Signed)
Pt c/o cough, nasal congestion, wheezing, popping in her chest, lower right back pain, chest congestion x8days  Pt states that she had similar symptoms 1 week before christmas and symptoms returned a week ago.

## 2023-12-12 ENCOUNTER — Ambulatory Visit: Payer: 59

## 2024-01-22 ENCOUNTER — Ambulatory Visit: Payer: Self-pay | Admitting: Physician Assistant

## 2024-03-01 ENCOUNTER — Ambulatory Visit: Payer: 59 | Admitting: Physician Assistant

## 2024-04-12 NOTE — Progress Notes (Signed)
 Cardiology Office Note  Date:  04/13/2024   ID:  Ashley Floyd, DOB 17-Nov-1971, MRN [id number]  PCP:  Patient, No Pcp Per   Chief Complaint  Patient presents with   New Patient (Initial Visit)    Patient c/o palpitations, tachycardia and chest pain if under a lot of stress.     HPI:  Ashley Floyd is a 53 year old woman with  obesity,  bipolar disorder, depression/anxiety,  hypertension,  shortness of breath weight gain, dietary indiscretion. Laparoscopic cholecystectomy, chronic diarrhea She presents today for consultation of her shortness of breath, hypertension  On discussion today, she reports that she has chronic insomnia Weight trending higher Stress at home, takes care of mother, who lives in assisted living Memory care, mother in hospital numerous visits to the ER and admissions over past few months  Ran out of refills, stopped the losartan  medication On meds for depression/anxiety/bipolar Reports that she ran out of klonopin  Restarted losartan  HCT Appreciated tachycardia without Klonopin  At Dentist, BP elevated Sent to the ER, they felt she was having Klonopin  withdrawal  Given klonopin , BP settled out  Restart losartan  HCTZ 50/12.5 again, felt dizzy  Home BP elevated   cardiac workup in the past including echocardiogram, stress test, chest CT scan  Last seen by myself in clinic July 2021  Work-up October 2016,  low risk stress test Normal echocardiogram  Chronic shortness of breath Did not tolerate torsemide , blood pressure dropped, Tolerating low-dose HCTZ  Troubled by menopause  Working at [employer]  EKG personally reviewed by myself on todays visit EKG Interpretation Date/Time:  Tuesday Apr 13 2024 14:25:55 EDT Ventricular Rate:  104 PR Interval:  154 QRS Duration:  84 QT Interval:  320 QTC Calculation: 420 R Axis:   13  Text Interpretation: Sinus tachycardia When compared with ECG of 11-Dec-2023 19:31, No significant change was found Confirmed by  Belva Boyden [phone]) on 04/13/2024 2:29:23 PM     PMH:   has a past medical history of Anxiety, Arthritis, Bipolar affective disorder (HCC), Complication of anesthesia, Depression, Dysmenorrhea, Endometriosis, Family history of adverse reaction to anesthesia, GERD (gastroesophageal reflux disease), Headache, Hemorrhoid, Herpes genitalis, History of mammogram (03/22/2015), History of nuclear stress test, History of Papanicolaou smear of cervix (03/08/2015), Hypertension, IBS (irritable bowel syndrome), Infertility, female, Lichen sclerosus, Obesity, Ovarian cyst (1998), PMDD (premenstrual dysphoric disorder), PONV (postoperative nausea and vomiting), Shortness of breath dyspnea, Sinus infection, Sleep apnea, and Vertigo.  PSH:    Past Surgical History:  Procedure Laterality Date   CHOLECYSTECTOMY N/A 12/07/2015   Procedure: LAPAROSCOPIC CHOLECYSTECTOMY ;  Surgeon: Marshall Skeeter, MD;  Location: [facility] ORS;  Service: General;  Laterality: N/A;   COLONOSCOPY  11/2022   COLONOSCOPY WITH PROPOFOL  N/A 10/27/2020   Procedure: COLONOSCOPY WITH BIOPSY;  Surgeon: Marnee Sink, MD;  Location: [facility];  Service: Endoscopy;  Laterality: N/A;  sleep apnea   COLONOSCOPY WITH PROPOFOL  N/A 11/30/2021   Procedure: COLONOSCOPY WITH PROPOFOL ;  Surgeon: Marnee Sink, MD;  Location: [facility];  Service: Endoscopy;  Laterality: N/A;   DIAGNOSTIC LAPAROSCOPY  1998; 2006   OSIS; OVARIAN CYST (2006 AT [facility])   OVARIAN CYST REMOVAL     POLYPECTOMY N/A 10/27/2020   Procedure: POLYPECTOMY;  Surgeon: Marnee Sink, MD;  Location: [facility];  Service: Endoscopy;  Laterality: N/A;  Clip x 2 placed at Cecal Polyp Removal   POLYPECTOMY N/A 11/30/2021   Procedure: POLYPECTOMY;  Surgeon: Marnee Sink, MD;  Location: [facility];  Service: Endoscopy;  Laterality: N/A;   UPPER GI ENDOSCOPY  2015   Dr Ole Berkeley   URETHRAL DILATION      Current Outpatient Medications  Medication Sig Dispense  Refill   albuterol  (VENTOLIN  HFA) 108 (90 Base) MCG/ACT inhaler Inhale 2 puffs into the lungs every 4 (four) hours as needed. 6.7 g 0   ALREX 0.2 % SUSP USE 1 DROP(S) IN BOTH EYES 4 TIMES A DAY AS NEEDED [PRN]     beclomethasone (QVAR  REDIHALER) 40 MCG/ACT inhaler Inhale 2 puffs into the lungs 2 (two) times daily. 1 each 0   carbamazepine  (TEGRETOL ) 200 MG tablet Take 800 mg by mouth at bedtime.     clobetasol  ointment (TEMOVATE ) 0.05 % Apply to affected area at bedtime for 4 wks, then QOHS for 4 wks, then 1-2 times weekly as maintenance 60 g 1   cyclobenzaprine (FLEXERIL) 10 MG tablet Take 10 mg by mouth 2 (two) times daily as needed.     desonide  (DESOWEN ) 0.05 % cream Apply topically 2 (two) times daily. 60 g 1   dexlansoprazole  (DEXILANT ) 60 MG capsule Take 1 capsule (60 mg total) by mouth daily. 90 capsule 3   diphenhydrAMINE  (BENADRYL ) 25 MG tablet Take 25 mg by mouth at bedtime as needed. Reported on 05/23/2016     hydrALAZINE  (APRESOLINE ) 25 MG tablet Take 1 tablet (25 mg total) by mouth 2 (two) times daily as needed (high pressure, headache). 180 tablet 3   metoprolol  tartrate (LOPRESSOR ) 25 MG tablet Take 1 tablet (25 mg total) by mouth 2 (two) times daily as needed (tachycardia). 180 tablet 3   naproxen  (NAPROSYN ) 500 MG tablet Take 500 mg by mouth 2 (two) times daily as needed.     nystatin (MYCOSTATIN) 100000 UNIT/ML suspension Take by mouth.     olopatadine (PATANOL) 0.1 % ophthalmic solution Place 1 drop into both eyes 4 (four) times daily as needed.      ondansetron  (ZOFRAN -ODT) 4 MG disintegrating tablet Take 1 tablet (4 mg total) by mouth every 8 (eight) hours as needed for nausea or vomiting. 20 tablet 1   terconazole  (TERAZOL 7 ) 0.4 % vaginal cream AAA BID prn sx 45 g 1   valACYclovir  (VALTREX ) 500 MG tablet TAKE 1 TABLET BY MOUTH 2  TIMES DAILY FOR 3 DAYS AS  NEEDED FOR SYMPTOMS 30 tablet 1   zaleplon (SONATA) 10 MG capsule Take 10 mg by mouth daily.     clonazePAM  (KLONOPIN ) 1  MG tablet Take 1 tablet (1 mg total) by mouth at bedtime as needed for up to 5 days for anxiety. 5 tablet 0   colesevelam  (WELCHOL ) 625 MG tablet Take 1 tablet (625 mg total) by mouth 2 (two) times daily with a meal. (Patient not taking: Reported on 04/13/2024) 180 tablet 3   No current facility-administered medications for this visit.     Allergies:   Cefdinir, Cephalosporins, Progesterone , Adhesive [tape], and Penicillins   Social History:  The patient  reports that she has never smoked. She has never used smokeless tobacco. She reports that she does not currently use alcohol. She reports that she does not use drugs.   Family History:   family history includes Arrhythmia in her mother; Broken bones in her mother; Cancer in her mother and paternal grandfather; Diabetes in her father, paternal aunt, paternal grandmother, and another family member; Heart attack in her paternal grandmother; Heart disease in her maternal aunt and paternal grandmother; Heart failure in her maternal aunt; Hypertension in her maternal grandfather and  maternal grandmother.   Review of Systems: Review of Systems  Constitutional: Negative.   Respiratory:  Positive for shortness of breath.   Cardiovascular:  Positive for palpitations.  Gastrointestinal: Negative.   Musculoskeletal: Negative.   Neurological:  Positive for headaches.  Psychiatric/Behavioral: Negative.    All other systems reviewed and are negative.   PHYSICAL EXAM: VS:  BP (!) 162/102 (BP Location: Right Arm, Patient Position: Sitting, Cuff Size: Large)   Pulse (!) 104   Ht 5\' 4"  (1.626 m)   Wt 240 lb 2 oz (108.9 kg)   LMP 11/15/2019   SpO2 97%   BMI 41.22 kg/m  , BMI Body mass index is 41.22 kg/m. Constitutional:  oriented to person, place, and time. No distress.  Obese HENT:  Head: Grossly normal Eyes:  no discharge. No scleral icterus.  Neck: No JVD, no carotid bruits  Cardiovascular: Regular rate and rhythm, no murmurs  appreciated Minimally pitting lower extremity swelling Pulmonary/Chest: Clear to auscultation bilaterally, no wheezes or rails Abdominal: Soft.  no distension.  no tenderness.  Musculoskeletal: Normal range of motion Neurological:  normal muscle tone. Coordination normal. No atrophy Skin: Skin warm and dry Psychiatric: normal affect, pleasant  Recent Labs: No results found for requested labs within last 365 days.    Lipid Panel No results found for: "CHOL", "HDL", "LDLCALC", "TRIG"    Wt Readings from Last 3 Encounters:  04/13/24 240 lb 2 oz (108.9 kg)  12/11/23 243 lb (110.2 kg)  12/11/23 243 lb (110.2 kg)      ASSESSMENT AND PLAN:  Essential hypertension - Recommend she restart losartan  HCTZ 50/12.5 mg daily Suggested trial 1/2 pill daily for the first week or 2 with titration upwards as needed for high blood pressure -Hydralazine  25 mg as needed for high blood pressure associated with headaches  Tachycardia Sinus tachycardia noted at times, recommended metoprolol  succinate 25 daily  SOB (shortness of breath) -  Likely secondary to obesity and deconditioning Weight trending higher Recommend regular walking program as tolerated for conditioning  Morbid obesity (HCC) - With hypertension, sleep apnea, shortness of breath, diastolic dysfunction, elevated glucose  OSA (obstructive sleep apnea) - Recommended walking program  Adjustment disorder Take care of mother who has medical issues, work stressful, symptoms stable with Klonopin   Insomnia Has tried numerous strategies in the past Will defer to primary care   Orders Placed This Encounter  Procedures   EKG 12-Lead     Signed, Juanda Noon, M.D., Ph.D. 04/13/2024  [facility] [place], [place] [phone]

## 2024-04-13 ENCOUNTER — Ambulatory Visit: Attending: Cardiovascular Disease | Admitting: Cardiovascular Disease

## 2024-04-13 VITALS — BP 162/102 | HR 104 | Ht 64.0 in | Wt 240.1 lb

## 2024-04-13 DIAGNOSIS — R0602 Shortness of breath: Secondary | ICD-10-CM

## 2024-04-13 DIAGNOSIS — I10 Essential (primary) hypertension: Secondary | ICD-10-CM

## 2024-04-13 MED ORDER — METOPROLOL SUCCINATE ER 25 MG PO TB24: 25.0000 mg | ORAL_TABLET | Freq: Every day | ORAL | 3 refills | Status: AC

## 2024-04-13 MED ORDER — HYDRALAZINE HCL 25 MG PO TABS: 25.0000 mg | ORAL_TABLET | Freq: Three times a day (TID) | ORAL | 3 refills | Status: DC | PRN

## 2024-04-13 MED ORDER — LOSARTAN POTASSIUM-HCTZ 50-12.5 MG PO TABS
1.0000 | ORAL_TABLET | Freq: Every day | ORAL | 3 refills | Status: AC
Start: 2024-04-13 — End: ?

## 2024-04-13 NOTE — Patient Instructions (Addendum)
 Medication Instructions:  Please start metoprolol  succinate 25 mg once a day, take extra as needed for fast rhythm  Restart losartan  hydrochlorothiazide  50/12.5 (1/2 a pill to start for 1-2 weeks)  Hydralazine  25 mg three times a day as needed for high pressures, headaches  If you need a refill on your cardiac medications before your next appointment, please call your pharmacy.   Lab work: No new labs needed  Testing/Procedures: No new testing needed  Follow-Up: At [facility], you and your health needs are our priority.  As part of our continuing mission to provide you with exceptional heart care, we have created designated Provider Care Teams.  These Care Teams include your primary Cardiologist (physician) and Advanced Practice Providers (APPs -  Physician Assistants and Nurse Practitioners) who all work together to provide you with the care you need, when you need it.  You will need a follow up appointment in 3 months  Providers on your designated Care Team:   Laneta Pintos, NP Varney Gentleman, PA-C Cadence Gennaro Khat, [place]  COVID-19 Vaccine Information can be found at: [email or web address] For questions related to vaccine distribution or appointments, please email [email or web address] or call [phone].

## 2024-04-30 ENCOUNTER — Encounter: Payer: Self-pay | Admitting: Gastroenterology

## 2024-05-13 ENCOUNTER — Encounter: Payer: Self-pay | Admitting: General Practice

## 2024-05-31 ENCOUNTER — Other Ambulatory Visit: Payer: Self-pay | Admitting: Obstetrics and Gynecology

## 2024-05-31 DIAGNOSIS — I10 Essential (primary) hypertension: Secondary | ICD-10-CM

## 2024-05-31 DIAGNOSIS — L900 Lichen sclerosus et atrophicus: Secondary | ICD-10-CM

## 2024-05-31 DIAGNOSIS — B354 Tinea corporis: Secondary | ICD-10-CM

## 2024-06-01 ENCOUNTER — Other Ambulatory Visit: Payer: Self-pay | Admitting: Obstetrics and Gynecology

## 2024-06-01 DIAGNOSIS — B354 Tinea corporis: Secondary | ICD-10-CM

## 2024-06-07 ENCOUNTER — Ambulatory Visit: Admitting: Cardiovascular Disease

## 2024-06-09 ENCOUNTER — Ambulatory Visit: Admitting: Nurse Practitioner

## 2024-06-10 ENCOUNTER — Ambulatory Visit: Admitting: Nurse Practitioner

## 2024-07-10 NOTE — Progress Notes (Unsigned)
 Cardiology Office Note  Date:  07/12/2024   ID:  Ashley Floyd, DOB [birth year], MRN [id number]  PCP:  Patient, No Pcp Per   Chief Complaint  Patient presents with   3 month follow up     Patient c/o fluttering in chest at times.     HPI:  [name] is a 53 year old woman with  obesity,  bipolar disorder, depression/anxiety,  hypertension,  shortness of breath weight gain, dietary indiscretion. Laparoscopic cholecystectomy, chronic diarrhea She presents today for consultation of her shortness of breath, hypertension, palpitations  LOV 5/25 Stress at home, father with fall, broken pelvis He is having some behavioral issues Mother in memory nursing care  On today's follow-up she reports she is not taking blood pressure medication Palpitations overnight, eating cereal brings on palpitions in the morning,  Not on her losartan  HCT  Previously ran out of Klonopin  used for her depression/anxiety/bipolar Appreciated tachycardia without Klonopin    cardiac workup in the past including echocardiogram, stress test, chest CT scan  Did not tolerate torsemide , blood pressure dropped, Proceed tolerated low-dose HCTZ  Troubled by menopause  Working at [employer]  EKG personally reviewed by myself on todays visit EKG Interpretation Date/Time:  Monday July 12 2024 16:36:08 EDT Ventricular Rate:  104 PR Interval:  160 QRS Duration:  78 QT Interval:  334 QTC Calculation: 439 R Axis:   13  Text Interpretation: Sinus tachycardia When compared with ECG of 13-Apr-2024 14:25, No significant change was found Confirmed by Perla Lye [phone]) on 07/12/2024 4:45:42 PM     PMH:   has a past medical history of Anxiety, Arthritis, Bipolar affective disorder (HCC), Complication of anesthesia, Depression, Dysmenorrhea, Endometriosis, Family history of adverse reaction to anesthesia, GERD (gastroesophageal reflux disease), Headache, Hemorrhoid, Herpes genitalis, History of mammogram (03/22/2015),  History of nuclear stress test, History of Papanicolaou smear of cervix (03/08/2015), Hypertension, IBS (irritable bowel syndrome), Infertility, female, Lichen sclerosus, Obesity, Ovarian cyst (1998), PMDD (premenstrual dysphoric disorder), PONV (postoperative nausea and vomiting), Shortness of breath dyspnea, Sinus infection, Sleep apnea, and Vertigo.  PSH:    Past Surgical History:  Procedure Laterality Date   CHOLECYSTECTOMY N/A 12/07/2015   Procedure: LAPAROSCOPIC CHOLECYSTECTOMY ;  Surgeon: Reyes LELON Cota, MD;  Location: [facility] ORS;  Service: General;  Laterality: N/A;   COLONOSCOPY  11/2022   COLONOSCOPY WITH PROPOFOL  N/A 10/27/2020   Procedure: COLONOSCOPY WITH BIOPSY;  Surgeon: Jinny Carmine, MD;  Location: [facility];  Service: Endoscopy;  Laterality: N/A;  sleep apnea   COLONOSCOPY WITH PROPOFOL  N/A 11/30/2021   Procedure: COLONOSCOPY WITH PROPOFOL ;  Surgeon: Jinny Carmine, MD;  Location: [facility];  Service: Endoscopy;  Laterality: N/A;   DIAGNOSTIC LAPAROSCOPY  1998; 2006   OSIS; OVARIAN CYST (2006 AT [facility])   OVARIAN CYST REMOVAL     POLYPECTOMY N/A 10/27/2020   Procedure: POLYPECTOMY;  Surgeon: Jinny Carmine, MD;  Location: [facility];  Service: Endoscopy;  Laterality: N/A;  Clip x 2 placed at Cecal Polyp Removal   POLYPECTOMY N/A 11/30/2021   Procedure: POLYPECTOMY;  Surgeon: Jinny Carmine, MD;  Location: [facility];  Service: Endoscopy;  Laterality: N/A;   UPPER GI ENDOSCOPY  2015   Dr Jinny   URETHRAL DILATION      Current Outpatient Medications  Medication Sig Dispense Refill   albuterol  (VENTOLIN  HFA) 108 (90 Base) MCG/ACT inhaler Inhale 2 puffs into the lungs every 4 (four) hours as needed. 6.7 g 0   ALREX 0.2 % SUSP USE 1 DROP(S)  IN BOTH EYES 4 TIMES A DAY AS NEEDED [PRN]     beclomethasone (QVAR  REDIHALER) 40 MCG/ACT inhaler Inhale 2 puffs into the lungs 2 (two) times daily. 1 each 0   carbamazepine  (TEGRETOL ) 200 MG tablet Take 800  mg by mouth at bedtime.     clobetasol  ointment (TEMOVATE ) 0.05 % Apply to affected area at bedtime for 4 wks, then QOHS for 4 wks, then 1-2 times weekly as maintenance 60 g 1   clonazePAM  (KLONOPIN ) 1 MG tablet Take 1 tablet (1 mg total) by mouth at bedtime as needed for up to 5 days for anxiety. 5 tablet 0   cyclobenzaprine (FLEXERIL) 10 MG tablet Take 10 mg by mouth 2 (two) times daily as needed.     desonide  (DESOWEN ) 0.05 % cream Apply topically 2 (two) times daily. 60 g 1   dexlansoprazole  (DEXILANT ) 60 MG capsule Take 1 capsule (60 mg total) by mouth daily. 90 capsule 3   diphenhydrAMINE  (BENADRYL ) 25 MG tablet Take 25 mg by mouth at bedtime as needed. Reported on 05/23/2016     hydrALAZINE  (APRESOLINE ) 25 MG tablet Take 1 tablet (25 mg total) by mouth 3 (three) times daily as needed (high pressure, headache). 90 tablet 3   losartan -hydrochlorothiazide  (HYZAAR) 50-12.5 MG tablet Take 1 tablet by mouth daily. 90 tablet 3   metoprolol  succinate (TOPROL -XL) 25 MG 24 hr tablet Take 1 tablet (25 mg total) by mouth daily. Take with or immediately following a meal. 90 tablet 3   metoprolol  tartrate (LOPRESSOR ) 25 MG tablet Take 1 tablet (25 mg total) by mouth 2 (two) times daily as needed (tachycardia). 180 tablet 3   naproxen  (NAPROSYN ) 500 MG tablet Take 500 mg by mouth 2 (two) times daily as needed.     nystatin (MYCOSTATIN) 100000 UNIT/ML suspension Take by mouth.     olopatadine (PATANOL) 0.1 % ophthalmic solution Place 1 drop into both eyes 4 (four) times daily as needed.      ondansetron  (ZOFRAN -ODT) 4 MG disintegrating tablet Take 1 tablet (4 mg total) by mouth every 8 (eight) hours as needed for nausea or vomiting. 20 tablet 1   terconazole  (TERAZOL 7 ) 0.4 % vaginal cream AAA BID prn sx 45 g 1   valACYclovir  (VALTREX ) 500 MG tablet TAKE 1 TABLET BY MOUTH 2  TIMES DAILY FOR 3 DAYS AS  NEEDED FOR SYMPTOMS 30 tablet 1   zaleplon (SONATA) 10 MG capsule Take 10 mg by mouth daily.     No current  facility-administered medications for this visit.     Allergies:   Cefdinir, Cephalosporins, Progesterone , Adhesive [tape], and Penicillins   Social History:  The patient  reports that she has never smoked. She has never used smokeless tobacco. She reports that she does not currently use alcohol. She reports that she does not use drugs.   Family History:   family history includes Arrhythmia in her mother; Broken bones in her mother; Cancer in her mother and paternal grandfather; Diabetes in her father, paternal aunt, paternal grandmother, and another family member; Heart attack in her paternal grandmother; Heart disease in her maternal aunt and paternal grandmother; Heart failure in her maternal aunt; Hypertension in her maternal grandfather and maternal grandmother.   Review of Systems: Review of Systems  Constitutional: Negative.   Respiratory: Negative.    Cardiovascular:  Positive for palpitations.  Gastrointestinal: Negative.   Musculoskeletal: Negative.   Neurological:  Positive for headaches.  Psychiatric/Behavioral: Negative.    All other systems reviewed and  are negative.  PHYSICAL EXAM: VS:  BP (!) 130/90 (BP Location: Left Arm, Patient Position: Sitting, Cuff Size: Large)   Pulse (!) 104   Ht 5' 4 (1.626 m)   Wt 244 lb 6 oz (110.8 kg)   LMP 11/15/2019   SpO2 98%   BMI 41.95 kg/m  , BMI Body mass index is 41.95 kg/m. Constitutional:  oriented to person, place, and time. No distress.  HENT:  Head: Grossly normal Eyes:  no discharge. No scleral icterus.  Neck: No JVD, no carotid bruits  Cardiovascular: Regular rate and rhythm, no murmurs appreciated Pulmonary/Chest: Clear to auscultation bilaterally, no wheezes or rails Abdominal: Soft.  no distension.  no tenderness.  Musculoskeletal: Normal range of motion Neurological:  normal muscle tone. Coordination normal. No atrophy Skin: Skin warm and dry Psychiatric: normal affect, pleasant  Recent Labs: No results  found for requested labs within last 365 days.    Lipid Panel No results found for: CHOL, HDL, LDLCALC, TRIG    Wt Readings from Last 3 Encounters:  07/12/24 244 lb 6 oz (110.8 kg)  04/13/24 240 lb 2 oz (108.9 kg)  12/11/23 243 lb (110.2 kg)     ASSESSMENT AND PLAN:  Palpitations Recommend she start metoprolol  succinate 25 in the evening.  For continued palpitations, we could increase metoprolol  succinate up to 50 or add metoprolol  tartrate as needed  Tachycardia History of sinus tachycardia  recommended metoprolol  succinate 25 daily and evening  SOB (shortness of breath) -  Likely secondary to obesity and deconditioning Recommend regular walking program for exercise, conditioning  Essential hypertension Start metoprolol  succinate 25 in the evening as above - May need half dose losartan  HCTZ if numbers continue running high  Morbid obesity (HCC) - BMI estimated over 40 With history of hypertension, sleep apnea, shortness of breath, diastolic dysfunction, elevated glucose Walking program, low carbohydrates recommended  OSA (obstructive sleep apnea) - Recommended walking program  Adjustment disorder Takes care of mother who has medical issues, lives in memory care - Father with behavior issues, recent fall and pelvic fracture  Insomnia Has tried numerous strategies in the past Will defer to primary care   Orders Placed This Encounter  Procedures   EKG 12-Lead     Signed, Velinda Lunger, M.D., Ph.D. 07/12/2024  [facility] [place], [place] [phone]

## 2024-07-12 ENCOUNTER — Encounter: Payer: Self-pay | Admitting: Cardiovascular Disease

## 2024-07-12 ENCOUNTER — Ambulatory Visit: Attending: Cardiovascular Disease | Admitting: Cardiovascular Disease

## 2024-07-12 VITALS — BP 130/90 | HR 104 | Ht 64.0 in | Wt 244.4 lb

## 2024-07-12 DIAGNOSIS — I10 Essential (primary) hypertension: Secondary | ICD-10-CM

## 2024-07-12 DIAGNOSIS — R0602 Shortness of breath: Secondary | ICD-10-CM

## 2024-07-12 MED ORDER — METOPROLOL TARTRATE 25 MG PO TABS: 25.0000 mg | ORAL_TABLET | Freq: Two times a day (BID) | ORAL | 3 refills | Status: AC | PRN

## 2024-07-12 MED ORDER — HYDRALAZINE HCL 25 MG PO TABS: 25.0000 mg | ORAL_TABLET | Freq: Three times a day (TID) | ORAL | 3 refills | Status: DC | PRN

## 2024-07-12 NOTE — Patient Instructions (Addendum)
 Medication Instructions:   Restart metoprolol  succinate 25 mg daily (dinner)  Take metoprolol  tartrate 25 mg as needed up to twice a day   OK to restart reduced dose losartan  hydrochlorothiazide  1/2 pill if pressure elevated  If you need a refill on your cardiac medications before your next appointment, please call your pharmacy.   Lab work: No new labs needed  Testing/Procedures: No new testing needed  Follow-Up: At [facility], you and your health needs are our priority.  As part of our continuing mission to provide you with exceptional heart care, we have created designated Provider Care Teams.  These Care Teams include your primary Cardiologist (physician) and Advanced Practice Providers (APPs -  Physician Assistants and Nurse Practitioners) who all work together to provide you with the care you need, when you need it.  You will need a follow up appointment in 12 months  Providers on your designated Care Team:   Lonni Meager, NP Bernardino Bring, PA-C Cadence [place], [place]  COVID-19 Vaccine Information can be found at: [email or web address] For questions related to vaccine distribution or appointments, please email [email or web address] or call [phone].

## 2024-07-22 ENCOUNTER — Telehealth: Payer: Self-pay

## 2024-07-22 NOTE — Telephone Encounter (Signed)
 Prior auth completed for both generic and name brand... Both were denied... Appeals submitted for generic and has been approved through 12/2024...  Pt is aware

## 2024-07-26 ENCOUNTER — Encounter: Payer: Self-pay | Admitting: Gastroenterology

## 2024-07-27 MED ORDER — ONDANSETRON 4 MG PO TBDP: 4.0000 mg | ORAL_TABLET | Freq: Three times a day (TID) | ORAL | 0 refills | Status: AC | PRN

## 2024-07-27 MED ORDER — DEXLANSOPRAZOLE 60 MG PO CPDR: 60.0000 mg | DELAYED_RELEASE_CAPSULE | Freq: Every day | ORAL | 0 refills | Status: DC

## 2024-08-27 ENCOUNTER — Encounter: Payer: Self-pay | Admitting: Nurse Practitioner

## 2024-08-27 ENCOUNTER — Ambulatory Visit: Admitting: Nurse Practitioner

## 2024-08-27 VITALS — BP 158/92 | HR 88 | Temp 98.2°F | Resp 16 | Ht 64.02 in | Wt 236.9 lb

## 2024-08-27 DIAGNOSIS — F3177 Bipolar disorder, in partial remission, most recent episode mixed: Secondary | ICD-10-CM

## 2024-08-27 DIAGNOSIS — E7800 Pure hypercholesterolemia, unspecified: Secondary | ICD-10-CM | POA: Insufficient documentation

## 2024-08-27 DIAGNOSIS — K582 Mixed irritable bowel syndrome: Secondary | ICD-10-CM

## 2024-08-27 DIAGNOSIS — E66813 Obesity, class 3: Secondary | ICD-10-CM

## 2024-08-27 DIAGNOSIS — Z7689 Persons encountering health services in other specified circumstances: Secondary | ICD-10-CM

## 2024-08-27 DIAGNOSIS — I10 Essential (primary) hypertension: Secondary | ICD-10-CM | POA: Diagnosis not present

## 2024-08-27 DIAGNOSIS — Z636 Dependent relative needing care at home: Secondary | ICD-10-CM | POA: Insufficient documentation

## 2024-08-27 DIAGNOSIS — R7301 Impaired fasting glucose: Secondary | ICD-10-CM

## 2024-08-27 DIAGNOSIS — F4312 Post-traumatic stress disorder, chronic: Secondary | ICD-10-CM

## 2024-08-27 DIAGNOSIS — R1013 Epigastric pain: Secondary | ICD-10-CM

## 2024-08-27 NOTE — Patient Instructions (Signed)
 Mammogram is due 10/21/24 Saw GYN last on 12/21/23  Heart-Healthy Eating Plan Many factors influence your heart health, including eating and exercise habits. Heart health is also called coronary health. Coronary risk increases with abnormal blood fat (lipid) levels. A heart-healthy eating plan includes limiting unhealthy fats, increasing healthy fats, limiting salt (sodium) intake, and making other diet and lifestyle changes. What is my plan? Your health care provider may recommend that: You limit your fat intake to _________% or less of your total calories each day. You limit your saturated fat intake to _________% or less of your total calories each day. You limit the amount of cholesterol in your diet to less than _________ mg per day. You limit the amount of sodium in your diet to less than _________ mg per day. What are tips for following this plan? Cooking Cook foods using methods other than frying. Baking, boiling, grilling, and broiling are all good options. Other ways to reduce fat include: Removing the skin from poultry. Removing all visible fats from meats. Steaming vegetables in water  or broth. Meal planning  At meals, imagine dividing your plate into fourths: Fill one-half of your plate with vegetables and green salads. Fill one-fourth of your plate with whole grains. Fill one-fourth of your plate with lean protein foods. Eat 2-4 cups of vegetables per day. One cup of vegetables equals 1 cup (91 g) broccoli or cauliflower florets, 2 medium carrots, 1 large bell pepper, 1 large sweet potato, 1 large tomato, 1 medium white potato, 2 cups (150 g) raw leafy greens. Eat 1-2 cups of fruit per day. One cup of fruit equals 1 small apple, 1 large banana, 1 cup (237 g) mixed fruit, 1 large orange,  cup (82 g) dried fruit, 1 cup (240 mL) 100% fruit juice. Eat more foods that contain soluble fiber. Examples include apples, broccoli, carrots, beans, peas, and barley. Aim to get 25-30 g of  fiber per day. Increase your consumption of legumes, nuts, and seeds to 4-5 servings per week. One serving of dried beans or legumes equals  cup (90 g) cooked, 1 serving of nuts is  oz (12 almonds, 24 pistachios, or 7 walnut halves), and 1 serving of seeds equals  oz (8 g). Fats Choose healthy fats more often. Choose monounsaturated and polyunsaturated fats, such as olive and canola oils, avocado oil, flaxseeds, walnuts, almonds, and seeds. Eat more omega-3 fats. Choose salmon, mackerel, sardines, tuna, flaxseed oil, and ground flaxseeds. Aim to eat fish at least 2 times each week. Check food labels carefully to identify foods with trans fats or high amounts of saturated fat. Limit saturated fats. These are found in animal products, such as meats, butter, and cream. Plant sources of saturated fats include palm oil, palm kernel oil, and coconut oil. Avoid foods with partially hydrogenated oils in them. These contain trans fats. Examples are stick margarine, some tub margarines, cookies, crackers, and other baked goods. Avoid fried foods. General information Eat more home-cooked food and less restaurant, buffet, and fast food. Limit or avoid alcohol. Limit foods that are high in added sugar and simple starches such as foods made using white refined flour (white breads, pastries, sweets). Lose weight if you are overweight. Losing just 5-10% of your body weight can help your overall health and prevent diseases such as diabetes and heart disease. Monitor your sodium intake, especially if you have high blood pressure. Talk with your health care provider about your sodium intake. Try to incorporate more vegetarian meals weekly. What  foods should I eat? Fruits All fresh, canned (in natural juice), or frozen fruits. Vegetables Fresh or frozen vegetables (raw, steamed, roasted, or grilled). Green salads. Grains Most grains. Choose whole wheat and whole grains most of the time. Rice and pasta,  including brown rice and pastas made with whole wheat. Meats and other proteins Lean, well-trimmed beef, veal, pork, and lamb. Chicken and [place] without skin. All fish and shellfish. Wild duck, rabbit, pheasant, and venison. Egg whites or low-cholesterol egg substitutes. Dried beans, peas, lentils, and tofu. Seeds and most nuts. Dairy Low-fat or nonfat cheeses, including ricotta and mozzarella. Skim or 1% milk (liquid, powdered, or evaporated). Buttermilk made with low-fat milk. Nonfat or low-fat yogurt. Fats and oils Non-hydrogenated (trans-free) margarines. Vegetable oils, including soybean, sesame, sunflower, olive, avocado, peanut, safflower, corn, canola, and cottonseed. Salad dressings or mayonnaise made with a vegetable oil. Beverages Water  (mineral or sparkling). Coffee and tea. Unsweetened ice tea. Diet beverages. Sweets and desserts Sherbet, gelatin, and fruit ice. Small amounts of dark chocolate. Limit all sweets and desserts. Seasonings and condiments All seasonings and condiments. The items listed above may not be a complete list of foods and beverages you can eat. Contact a dietitian for more options. What foods should I avoid? Fruits Canned fruit in heavy syrup. Fruit in cream or butter sauce. Fried fruit. Limit coconut. Vegetables Vegetables cooked in cheese, cream, or butter sauce. Fried vegetables. Grains Breads made with saturated or trans fats, oils, or whole milk. Croissants. Sweet rolls. Donuts. High-fat crackers, such as cheese crackers and chips. Meats and other proteins Fatty meats, such as hot dogs, ribs, sausage, bacon, rib-eye roast or steak. High-fat deli meats, such as salami and bologna. Caviar. Domestic duck and goose. Organ meats, such as liver. Dairy Cream, sour cream, cream cheese, and creamed cottage cheese. Whole-milk cheeses. Whole or 2% milk (liquid, evaporated, or condensed). Whole buttermilk. Cream sauce or high-fat cheese sauce. Whole-milk  yogurt. Fats and oils Meat fat, or shortening. Cocoa butter, hydrogenated oils, palm oil, coconut oil, palm kernel oil. Solid fats and shortenings, including bacon fat, salt pork, lard, and butter. Nondairy cream substitutes. Salad dressings with cheese or sour cream. Beverages Regular sodas and any drinks with added sugar. Sweets and desserts Frosting. Pudding. Cookies. Cakes. Pies. Milk chocolate or white chocolate. Buttered syrups. Full-fat ice cream or ice cream drinks. The items listed above may not be a complete list of foods and beverages to avoid. Contact a dietitian for more information. Summary Heart-healthy meal planning includes limiting unhealthy fats, increasing healthy fats, limiting salt (sodium) intake and making other diet and lifestyle changes. Lose weight if you are overweight. Losing just 5-10% of your body weight can help your overall health and prevent diseases such as diabetes and heart disease. Focus on eating a balance of foods, including fruits and vegetables, low-fat or nonfat dairy, lean protein, nuts and legumes, whole grains, and heart-healthy oils and fats. This information is not intended to replace advice given to you by your health care provider. Make sure you discuss any questions you have with your health care provider. Document Revised: 12/17/2021 Document Reviewed: 12/17/2021 Elsevier Patient Education  2024 [employer].

## 2024-08-27 NOTE — Assessment & Plan Note (Signed)
 Chronic, stable with Dexilant .  May need new referral to alternate GI since her current one has left practice, patient will alert provider if request to see another clinic.  Mag level today.

## 2024-08-27 NOTE — Assessment & Plan Note (Signed)
 Chronic, exacerbated by caregiver strain.  Denies SI/HI.  Continue collaboration with psychiatry, will attempt to obtain notes in future. Will continue all medications as ordered by them. Will place referral to [facility] for PharmD and SW assist.

## 2024-08-27 NOTE — Assessment & Plan Note (Signed)
 Noted past labs, check levels today. Continue focus on healthy diet and regular exercise. The ASCVD Risk score (Arnett DK, et al., 2019) failed to calculate for the following reasons:   Cannot find a previous HDL lab   Cannot find a previous total cholesterol lab
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
## 2024-08-27 NOTE — Assessment & Plan Note (Signed)
 Chronic, elevated today but has not been taking medications.  Caregiver strain present.  Will get [facility] SW and PharmD involved to assist with medication adherence and her stressors.  Highly recommend she take all BP medications as ordered. Recommend she monitor BP at least a few mornings a week at home and document.  DASH diet at home. Labs today: CBC, CMP, TSH.

## 2024-08-27 NOTE — Assessment & Plan Note (Signed)
 BMI 40.64 with HTN.  Recommended eating smaller high protein, low fat meals more frequently and exercising 30 mins a day 5 times a week with a goal of 10-15lb weight loss in the next 3 months. Patient voiced their understanding and motivation to adhere to these recommendations.

## 2024-08-27 NOTE — Assessment & Plan Note (Signed)
 Chronic, exacerbated by caregiver strain.  Denies SI/HI.  Continue collaboration with psychiatry, will attempt to obtain notes in future. Will continue all medications as ordered by them.

## 2024-08-28 ENCOUNTER — Ambulatory Visit: Payer: Self-pay | Admitting: Nurse Practitioner

## 2024-08-28 LAB — CBC WITH DIFFERENTIAL/PLATELET
Basophils Absolute: 0.1 10*3/uL (ref 0.0–0.2)
Basos: 2 %
EOS (ABSOLUTE): 0.2 10*3/uL (ref 0.0–0.4)
Eos: 4 %
Hematocrit: 43.6 % (ref 34.0–46.6)
Hemoglobin: 14.8 g/dL (ref 11.1–15.9)
Immature Grans (Abs): 0 10*3/uL (ref 0.0–0.1)
Immature Granulocytes: 0 %
Lymphocytes Absolute: 1.9 10*3/uL (ref 0.7–3.1)
Lymphs: 49 %
MCH: 30.8 pg (ref 26.6–33.0)
MCHC: 33.9 g/dL (ref 31.5–35.7)
MCV: 91 fL (ref 79–97)
Monocytes Absolute: 0.5 10*3/uL (ref 0.1–0.9)
Monocytes: 11 %
Neutrophils Absolute: 1.4 10*3/uL (ref 1.4–7.0)
Neutrophils: 34 %
Platelets: 239 10*3/uL (ref 150–450)
RBC: 4.8 x10E6/uL (ref 3.77–5.28)
RDW: 13 % (ref 11.7–15.4)
WBC: 4 10*3/uL (ref 3.4–10.8)

## 2024-08-28 LAB — COMPREHENSIVE METABOLIC PANEL WITH GFR
ALT: 24 [IU]/L (ref 0–32)
AST: 25 [IU]/L (ref 0–40)
Albumin: 4.2 g/dL (ref 3.8–4.9)
Alkaline Phosphatase: 118 [IU]/L (ref 49–135)
BUN/Creatinine Ratio: 13 (ref 9–23)
BUN: 10 mg/dL (ref 6–24)
Bilirubin Total: 0.3 mg/dL (ref 0.0–1.2)
CO2: 22 mmol/L (ref 20–29)
Calcium: 9.3 mg/dL (ref 8.7–10.2)
Chloride: 96 mmol/L (ref 96–106)
Creatinine, Ser: 0.78 mg/dL (ref 0.57–1.00)
Globulin, Total: 2.5 g/dL (ref 1.5–4.5)
Glucose: 102 mg/dL — ABNORMAL HIGH (ref 70–99)
Potassium: 4.3 mmol/L (ref 3.5–5.2)
Sodium: 133 mmol/L — ABNORMAL LOW (ref 134–144)
Total Protein: 6.7 g/dL (ref 6.0–8.5)
eGFR: 90 mL/min/{1.73_m2}

## 2024-08-28 LAB — LIPID PANEL W/O CHOL/HDL RATIO
Cholesterol, Total: 232 mg/dL — ABNORMAL HIGH (ref 100–199)
HDL: 52 mg/dL (ref >39)
LDL Chol Calc (NIH): 159 mg/dL — ABNORMAL HIGH (ref 0–99)
Triglycerides: 120 mg/dL (ref 0–149)
VLDL Cholesterol Cal: 21 mg/dL (ref 5–40)

## 2024-08-28 LAB — TSH: TSH: 1.02 u[IU]/mL (ref 0.450–4.500)

## 2024-08-28 LAB — HEMOGLOBIN A1C
Est. average glucose Bld gHb Est-mCnc: 100 mg/dL
Hgb A1c MFr Bld: 5.1 % (ref 4.8–5.6)

## 2024-08-28 LAB — MAGNESIUM: Magnesium: 2 mg/dL (ref 1.6–2.3)

## 2024-08-31 ENCOUNTER — Telehealth: Payer: Self-pay

## 2024-08-31 NOTE — Progress Notes (Signed)
 Complex Care Management Note  Care Guide Note 08/31/2024 Name: [name] MRN: [id number] DOB: [birth year]  Ashley Floyd is a 53 y.o. year old female who sees [place], Melanie T, NP for primary care. I reached out to Ashley Floyd by phone today to offer complex care management services.  Ms. Sakamoto was given information about Complex Care Management services today including:   The Complex Care Management services include support from the care team which includes your Nurse Care Manager, Clinical Social Worker, or Pharmacist.  The Complex Care Management team is here to help remove barriers to the health concerns and goals most important to you. Complex Care Management services are voluntary, and the patient may decline or stop services at any time by request to their care team member.   Complex Care Management Consent Status: Patient agreed to services and verbal consent obtained.   Follow up plan:  Telephone appointment with complex care management team member scheduled for:  09/21/2024  Encounter Outcome:  Patient Scheduled  Jeoffrey Buffalo , RMA       [facility], [facility] Guide  Direct Dial: [phone]  Website: [email or web address]

## 2024-09-20 NOTE — Telephone Encounter (Unsigned)
 Copied from CRM [id number]. Topic: Appointments - Appointment Cancel/Reschedule >> Sep 20, 2024 11:38 AM Tobias CROME wrote: Patient/patient representative is calling to cancel or reschedule an appointment. Refer to attachments for appointment information.    Patient needing to r/s appointment with [facility], LCSW 09/21/2024 2pm. Patient has a conflict that cannot be rescheduled.   Requesting callback: [phone]

## 2024-09-21 ENCOUNTER — Telehealth: Payer: Self-pay | Admitting: *Deleted

## 2024-10-05 ENCOUNTER — Other Ambulatory Visit: Payer: Self-pay | Admitting: Gastroenterology

## 2024-10-13 ENCOUNTER — Other Ambulatory Visit: Payer: Self-pay | Admitting: *Deleted

## 2024-10-13 NOTE — Patient Outreach (Signed)
 Phone call to patient to follow up on referral for caregiver strain. Patient contacted and confirmed that her father passed away on 05-Oct-2024. Emotional support provided. Initial assessment not completed as patient openly discussed factors leading up to fathers death and grief response. Time did not permit for assessment completion. Patient followed by Dr. Vincente for medication management. Declines need for follow up with a therapist. Agreeable to follow up in January 2026 with this therapist. Appointment scheduled for 12/03/2024.   Ashley Levay, LCSW [place]  [facility], [facility] Licensed Clinical Social Worker  Direct Dial: [phone]

## 2024-10-24 NOTE — Patient Instructions (Incomplete)

## 2024-10-27 ENCOUNTER — Ambulatory Visit: Admitting: Obstetrics and Gynecology

## 2024-11-01 LAB — HM MAMMOGRAPHY

## 2024-11-02 ENCOUNTER — Encounter: Payer: Self-pay | Admitting: Obstetrics and Gynecology

## 2024-11-05 ENCOUNTER — Ambulatory Visit: Admitting: Nurse Practitioner

## 2024-11-22 ENCOUNTER — Other Ambulatory Visit: Payer: Self-pay | Admitting: Cardiovascular Disease

## 2024-11-23 ENCOUNTER — Encounter: Payer: Self-pay | Admitting: Obstetrics and Gynecology

## 2024-11-24 MED ORDER — HYDRALAZINE HCL 25 MG PO TABS: 25.0000 mg | ORAL_TABLET | Freq: Three times a day (TID) | ORAL | 2 refills | Status: AC | PRN

## 2024-12-03 ENCOUNTER — Other Ambulatory Visit: Payer: Self-pay

## 2024-12-03 ENCOUNTER — Other Ambulatory Visit: Payer: Self-pay | Admitting: *Deleted

## 2024-12-03 NOTE — Patient Outreach (Signed)
 Complex Care Management   Visit Note  12/03/2024  Name:  Ashley Floyd MRN: [id number] DOB: [birth year]  Situation: Referral received for Complex Care Management related to grief support I obtained verbal consent from Patient.  Visit completed with Patient  on the phone  Background:   Past Medical History:  Diagnosis Date   Anxiety    a. per epic care everywhere & patient   Arthritis    Toes, knees   Bipolar affective disorder (HCC)    Complication of anesthesia    PT STATES SHE HAS A SMALL MOUTH AND HAS HAD DENTAL WORK ON HER 2 FRONT TEETH AND SHE WANTS EXTRA CARE TAKEN FOR HER TEETH   Depression    Dysmenorrhea    Endometriosis    Family history of adverse reaction to anesthesia    PTS MOM HAS TROUBLE WITH ANESTHESIA DUE TO HER SMOKING HISTORY   GERD (gastroesophageal reflux disease)    Headache    sinus/stress/BP   Hemorrhoid    Herpes genitalis    History of mammogram 03/22/2015   BI-RADS 1 AT [facility]   History of nuclear stress test    a. 08/2015: no EKG changes concerning for ischemia, normal wall motion, EF >60%, low risk study   History of Papanicolaou smear of cervix 03/08/2015   -/-   Hypertension    IBS (irritable bowel syndrome)    Infertility, female    [facility] PT IN THE PAST   Lichen sclerosus    Obesity    Ovarian cyst 1998   right   PMDD (premenstrual dysphoric disorder)    PONV (postoperative nausea and vomiting)    Shortness of breath dyspnea    Sinus infection    Sleep apnea    MILD-NO CPAP-LAST SLEEP STUDY YEARS AGO   Vertigo    rare    Assessment: Patient states that she is active with Dr. Kaur(psychiatry-medication management) confirms that she continues to manage well despite the recent loss of her father and the transition of her mother to skilled nursing. Patient encouraged to prioritize self care and boundary setting. Provided patient with this social worker's contact information in the event that any additional community resource needs  arise. Patient Reported Symptoms:  Cognitive Cognitive Status: Alert and oriented to person, place, and time, Insightful and able to interpret abstract concepts, Normal speech and language skills Cognitive/Intellectual Conditions Management [RPT]: None reported or documented in medical history or problem list   Health Maintenance Behaviors: None Healing Pattern: Average Health Facilitated by: Stress management, Rest  Neurological Neurological Review of Symptoms: No symptoms reported    HEENT HEENT Symptoms Reported: No symptoms reported      Cardiovascular Cardiovascular Symptoms Reported: No symptoms reported (flutters at times) Does patient have uncontrolled Hypertension?: Yes Is patient checking Blood Pressure at home?: No Patient's Recent BP reading at home: Has BP monitor but does not take BP regutarly- Cardiovascular Comment: currently not taking blood pressure medication  Respiratory Respiratory Symptoms Reported: Dry cough Other Respiratory Symptoms: recenlly had strep throat and covid- recovering now Additional Respiratory Details: Z pack and prednisone  given Respiratory Management Strategies: Medication therapy  Endocrine Endocrine Symptoms Reported: No symptoms reported Is patient diabetic?: No    Gastrointestinal Gastrointestinal Symptoms Reported: Abdominal pain or discomfort, Diarrhea, Constipation Additional Gastrointestinal Details: reports having IBS-uses immodium  also takes a multi symptom relief Gastrointestinal Management Strategies: Medication therapy    Genitourinary Genitourinary Symptoms Reported: No symptoms reported    Integumentary Integumentary Symptoms Reported: No symptoms reported  Musculoskeletal Musculoskelatal Symptoms Reviewed: Back pain Additional Musculoskeletal Details: uses patches for back, OTC and chiropractor Musculoskeletal Management Strategies: Adequate rest, Coping strategies, Medication therapy Falls in the past year?: Yes Number  of falls in past year: 1 or less Was there an injury with Fall?: Yes (road rash on knee, no broken bones) Fall Risk Category Calculator: 2 Patient Fall Risk Level: Moderate Fall Risk    Psychosocial Psychosocial Symptoms Reported: Depression - if selected complete PHQ 2-9 Additional Psychological Details: Father passed, mother now in a SNF, has not dad true time to grieve, understands how dangerous it is to hold it in-trying to be active and present for her mom and step-mom Behavioral Management Strategies: Coping strategies, Medication therapy Major Change/Loss/Stressor/Fears (CP): Death of a loved one, Medical condition, self Behaviors When Feeling Stressed/Fearful: dinner and movie with spouse, Techniques to [employer] with Loss/Stress/Change: Diversional activities Quality of Family Relationships: involved, supportive Do you feel physically threatened by others?: No    12/03/2024    PHQ2-9 Depression Screening   Little interest or pleasure in doing things More than half the days  Feeling down, depressed, or hopeless Nearly every day  PHQ-2 - Total Score 5  Trouble falling or staying asleep, or sleeping too much Nearly every day  Feeling tired or having little energy Nearly every day  Poor appetite or overeating  More than half the days  Feeling bad about yourself - or that you are a failure or have let yourself or your family down Several days  Trouble concentrating on things, such as reading the newspaper or watching television More than half the days  Moving or speaking so slowly that other people could have noticed.  Or the opposite - being so fidgety or restless that you have been moving around a lot more than usual Not at all  Thoughts that you would be better off dead, or hurting yourself in some way Not at all  PHQ2-9 Total Score 16  If you checked off any problems, how difficult have these problems made it for you to do your work, take care of things at home, or get along with other  people    Depression Interventions/Treatment Referral to Psychiatry (active with Dr. Vincente in [place], does want have counseling)    There were no vitals filed for this visit.    Medications Reviewed Today     Reviewed by Ermalinda Lenn HERO, LCSW (Social Worker) on 12/03/24 at 1408  Med List Status: <None>   Medication Order Taking? Sig Documenting Provider Last Dose Status Informant  albuterol  (VENTOLIN  HFA) 108 (90 Base) MCG/ACT inhaler [id number] Yes Inhale 2 puffs into the lungs every 4 (four) hours as needed. Brimage, Vondra, DO  Active   ALREX 0.2 % SUSP [id number] Yes USE 1 DROP(S) IN BOTH EYES 4 TIMES A DAY AS NEEDED [PRN] [provider]  Active   beclomethasone (QVAR  REDIHALER) 40 MCG/ACT inhaler [id number] Yes Inhale 2 puffs into the lungs 2 (two) times daily. Bernardino Ditch, NP  Active   carbamazepine  (TEGRETOL ) 200 MG tablet [id number] Yes Take 800 mg by mouth at bedtime. [provider]  Active   clobetasol  ointment (TEMOVATE ) 0.05 % [id number] Yes Apply to affected area at bedtime for 4 wks, then QOHS for 4 wks, then 1-2 times weekly as maintenance Copland, Alicia B, PA-C  Active   clonazePAM  (KLONOPIN ) 1 MG tablet [id number] Yes Take 1 tablet (1 mg total) by mouth at bedtime as needed for up to 5 days for  anxiety. Ernest Ronal BRAVO, MD  Active   cyclobenzaprine (FLEXERIL) 10 MG tablet [id number] Yes Take 10 mg by mouth 2 (two) times daily as needed. [provider]  Active   desonide  (DESOWEN ) 0.05 % cream [id number] Yes Apply topically 2 (two) times daily. Copland, Alicia B, PA-C  Active   dexlansoprazole  (DEXILANT ) 60 MG capsule [id number] Yes Take 1 capsule (60 mg total) by mouth daily. LAST REFILL MUST SCHEDULE VISIT Jinny Carmine, MD  Active   diphenhydrAMINE  (BENADRYL ) 25 MG tablet [id number] Yes Take 25 mg by mouth at bedtime as needed. Reported on 05/23/2016 [provider]  Active            Med Note GAYLENE DARVIN PARAS   Dju Oct 28, 2020  6:51 AM) Pt takes  nightly  hydrALAZINE  (APRESOLINE ) 25 MG tablet [id number] Yes Take 1 tablet (25 mg total) by mouth 3 (three) times daily as needed (high pressure, headache). Gollan, Timothy J, MD  Active   losartan -hydrochlorothiazide  (HYZAAR) 50-12.5 MG tablet [id number]  Take 1 tablet by mouth daily.  Patient not taking: Reported on 12/03/2024   Gollan, Timothy J, MD  Active   metoprolol  succinate (TOPROL -XL) 25 MG 24 hr tablet [id number] Yes Take 1 tablet (25 mg total) by mouth daily. Take with or immediately following a meal. Gollan, Timothy J, MD  Active   metoprolol  tartrate (LOPRESSOR ) 25 MG tablet [id number] Yes Take 1 tablet (25 mg total) by mouth 2 (two) times daily as needed (tachycardia). Gollan, Timothy J, MD  Active   naproxen  (NAPROSYN ) 500 MG tablet [id number] Yes Take 500 mg by mouth 2 (two) times daily as needed. [provider]  Active   ondansetron  (ZOFRAN -ODT) 4 MG disintegrating tablet [id number] Yes Take 1 tablet (4 mg total) by mouth every 8 (eight) hours as needed for nausea or vomiting. Jinny Carmine, MD  Active   terconazole  (TERAZOL 7 ) 0.4 % vaginal cream [id number] Yes AAA BID prn sx Copland, Alicia B, PA-C  Active   valACYclovir  (VALTREX ) 500 MG tablet [id number] Yes TAKE 1 TABLET BY MOUTH 2  TIMES DAILY FOR 3 DAYS AS  NEEDED FOR SYMPTOMS Copland, Alicia B, PA-C  Active   zaleplon (SONATA) 10 MG capsule [id number] Yes Take 10 mg by mouth daily. [provider]  Active   Med List Note Enrico, Colene LABOR, RN 12/07/15 9085): benadryl             Recommendation:   PCP Follow-up Specialty provider follow-up as scheduled Ongoing follow up- Psychiatry for medication management    Follow Up Plan:   Closing From:  Complex Care Management  Valyncia Wiens, LCSW [place]  [facility], [facility] Licensed Clinical Social Worker  Direct Dial: [phone]

## 2024-12-03 NOTE — Patient Instructions (Signed)
 Visit Information  Thank you for taking time to visit with me today. Please don't hesitate to contact me if I can be of assistance to you before our next scheduled appointment.  Our next appointment is no further scheduled appointments.   Please call this social worker with any additional community resource needs   Please call the Suicide and Crisis Lifeline: 988 call the [place]  National Suicide Prevention Lifeline: 208-025-7300 or TTY: 669-140-1095 TTY (251)798-7747) to talk to a trained counselor call 1-800-273-TALK (toll free, 24 hour hotline) call 911 if you are experiencing a Mental Health or Behavioral Health Crisis or need someone to talk to.   Kaleigha Chamberlin, LCSW [place]  [facility], [facility] Licensed Clinical Social Worker  Direct Dial: [phone]

## 2024-12-04 NOTE — Patient Instructions (Signed)

## 2024-12-10 ENCOUNTER — Ambulatory Visit: Admitting: Nurse Practitioner

## 2024-12-10 ENCOUNTER — Encounter: Payer: Self-pay | Admitting: Nurse Practitioner

## 2024-12-10 VITALS — BP 150/98 | HR 69 | Temp 97.7°F | Resp 16 | Ht 64.02 in | Wt 217.0 lb

## 2024-12-10 DIAGNOSIS — I10 Essential (primary) hypertension: Secondary | ICD-10-CM | POA: Diagnosis not present

## 2024-12-10 NOTE — Progress Notes (Signed)
 "  BP (!) 150/98 (BP Location: Left Arm, Patient Position: Sitting, Cuff Size: Large)   Pulse 69   Temp 97.7 F (36.5 C) (Oral)   Resp 16   Ht 5' 4.02 (1.626 m)   Wt 217 lb (98.4 kg)   LMP 11/15/2019   SpO2 96%   BMI 37.23 kg/m    Subjective:    Patient ID: Ashley Floyd, female    DOB: 08/25/1971, 54 y.o.   MRN: [id number]  HPI: Ashley Floyd is a 54 y.o. female  Chief Complaint  Patient presents with   Follow-up    Here for follow up   HYPERTENSION without Chronic Kidney Disease Here today to follow-up on blood pressure, was not taking all medications last visit and was advised to restart all of these. She has not been taking medications per her report, the Losartan  made her feel like passing out when she restarted it. Saw cardiology last on 07/12/24 -- he sent in Hydralazine  and Metoprolol . Has some current stressors as father just passed and is caregiver to her mother - moved her to skilled nursing. Hypertension status: uncontrolled  Satisfied with current treatment? yes Duration of hypertension: chronic BP monitoring frequency:  not checking BP range:  BP medication side effects:  no Medication compliance: good compliance Aspirin: no Recurrent headaches: no Visual changes: no Palpitations: rare flutters Dyspnea: no Chest pain: no Lower extremity edema: no Dizzy/lightheaded: no      12/10/2024    4:09 PM 12/03/2024    2:09 PM 08/27/2024   11:09 AM  Depression screen PHQ 2/9  Decreased Interest 2 2 2   Down, Depressed, Hopeless 1 3 3   PHQ - 2 Score 3 5 5   Altered sleeping 3 3 3   Tired, decreased energy 2 3 3   Change in appetite 1 2 2   Feeling bad or failure about yourself  1 1 2   Trouble concentrating 2 2 2   Moving slowly or fidgety/restless 0 0 1  Suicidal thoughts 0 0 1  PHQ-9 Score 12 16 19    Difficult doing work/chores Somewhat difficult  Somewhat difficult     Data saved with a previous flowsheet row definition       12/10/2024    4:09 PM 08/27/2024    11:09 AM  GAD 7 : Generalized Anxiety Score  Nervous, Anxious, on Edge 1 2  Control/stop worrying 1 2  Worry too much - different things 1 2  Trouble relaxing 2 2  Restless 0 1  Easily annoyed or irritable 0 1  Afraid - awful might happen 0 0  Total GAD 7 Score 5 10  Anxiety Difficulty Somewhat difficult Somewhat difficult   Relevant past medical, surgical, family and social history reviewed and updated as indicated. Interim medical history since our last visit reviewed. Allergies and medications reviewed and updated.  Review of Systems  Constitutional:  Negative for activity change, appetite change, diaphoresis, fatigue and fever.  Respiratory:  Negative for cough, chest tightness, shortness of breath and wheezing.   Cardiovascular:  Negative for chest pain, palpitations and leg swelling.  Gastrointestinal: Negative.   Neurological: Negative.   Psychiatric/Behavioral: Negative.     Per HPI unless specifically indicated above     Objective:    BP (!) 150/98 (BP Location: Left Arm, Patient Position: Sitting, Cuff Size: Large)   Pulse 69   Temp 97.7 F (36.5 C) (Oral)   Resp 16   Ht 5' 4.02 (1.626 m)   Wt 217 lb (98.4 kg)  LMP 11/15/2019   SpO2 96%   BMI 37.23 kg/m   Wt Readings from Last 3 Encounters:  12/10/24 217 lb (98.4 kg)  08/27/24 236 lb 14.4 oz (107.5 kg)  07/12/24 244 lb 6 oz (110.8 kg)    Physical Exam Vitals and nursing note reviewed.  Constitutional:      General: She is awake. She is not in acute distress.    Appearance: She is well-developed and well-groomed. She is obese. She is not ill-appearing or toxic-appearing.  HENT:     Head: Normocephalic.     Right Ear: Hearing and external ear normal.     Left Ear: Hearing and external ear normal.  Eyes:     General: Lids are normal.        Right eye: No discharge.        Left eye: No discharge.     Conjunctiva/sclera: Conjunctivae normal.     Pupils: Pupils are equal, round, and reactive to light.   Neck:     Thyroid : No thyromegaly.     Vascular: No carotid bruit.  Cardiovascular:     Rate and Rhythm: Normal rate and regular rhythm.     Heart sounds: Normal heart sounds. No murmur heard.    No gallop.  Pulmonary:     Effort: Pulmonary effort is normal. No accessory muscle usage or respiratory distress.     Breath sounds: Normal breath sounds. No decreased breath sounds, wheezing or rales.  Abdominal:     General: Bowel sounds are normal. There is no distension.     Palpations: Abdomen is soft.     Tenderness: There is no abdominal tenderness.  Musculoskeletal:     Cervical back: Normal range of motion and neck supple.     Right lower leg: No edema.     Left lower leg: No edema.  Lymphadenopathy:     Cervical: No cervical adenopathy.  Skin:    General: Skin is warm and dry.  Neurological:     Mental Status: She is alert and oriented to person, place, and time.     Deep Tendon Reflexes: Reflexes are normal and symmetric.     Reflex Scores:      Brachioradialis reflexes are 2+ on the right side and 2+ on the left side.      Patellar reflexes are 2+ on the right side and 2+ on the left side. Psychiatric:        Attention and Perception: Attention normal.        Mood and Affect: Mood normal.        Speech: Speech normal.        Behavior: Behavior normal. Behavior is cooperative.        Thought Content: Thought content normal.     Results for orders placed or performed in visit on 11/23/24  HM MAMMOGRAPHY   Collection Time: 11/01/24 12:00 AM  Result Value Ref Range   HM Mammogram 0-4 Bi-Rad 0-4 Bi-Rad, Self Reported Normal      Assessment & Plan:   Problem List Items Addressed This Visit       Cardiovascular and Mediastinum   Essential hypertension - Primary   Chronic. Remains elevated in office, but has not been consistently taking medications.  Caregiver strain present. Discussed with patient the risk of not taking medication and having ongoing elevations in BP,  such as stroke and MI. Highly recommend she take all BP medications as ordered. Recommend she monitor BP at least a few mornings  a week at home and document.  DASH diet at home. Labs today: up to date.         Follow up plan: Return in about 8 weeks (around 02/04/2025) for HTN, Depression.      "

## 2024-12-10 NOTE — Assessment & Plan Note (Signed)
 Chronic. Remains elevated in office, but has not been consistently taking medications.  Caregiver strain present. Discussed with patient the risk of not taking medication and having ongoing elevations in BP, such as stroke and MI. Highly recommend she take all BP medications as ordered. Recommend she monitor BP at least a few mornings a week at home and document.  DASH diet at home. Labs today: up to date.

## 2024-12-30 ENCOUNTER — Encounter: Payer: Self-pay | Admitting: Obstetrics and Gynecology

## 2024-12-30 ENCOUNTER — Ambulatory Visit: Admitting: Obstetrics and Gynecology

## 2024-12-30 VITALS — BP 121/85 | HR 108 | Ht 64.0 in | Wt 215.0 lb

## 2024-12-30 DIAGNOSIS — N941 Unspecified dyspareunia: Secondary | ICD-10-CM

## 2024-12-30 DIAGNOSIS — B354 Tinea corporis: Secondary | ICD-10-CM

## 2024-12-30 DIAGNOSIS — Z1211 Encounter for screening for malignant neoplasm of colon: Secondary | ICD-10-CM

## 2024-12-30 DIAGNOSIS — Z01419 Encounter for gynecological examination (general) (routine) without abnormal findings: Secondary | ICD-10-CM

## 2024-12-30 DIAGNOSIS — Z1231 Encounter for screening mammogram for malignant neoplasm of breast: Secondary | ICD-10-CM

## 2024-12-30 DIAGNOSIS — A6004 Herpesviral vulvovaginitis: Secondary | ICD-10-CM

## 2024-12-30 DIAGNOSIS — L900 Lichen sclerosus et atrophicus: Secondary | ICD-10-CM

## 2024-12-30 DIAGNOSIS — M545 Low back pain, unspecified: Secondary | ICD-10-CM

## 2024-12-30 MED ORDER — NAPROXEN 500 MG PO TABS: 500.0000 mg | ORAL_TABLET | Freq: Two times a day (BID) | ORAL | 1 refills | Status: AC | PRN

## 2024-12-30 MED ORDER — DESONIDE 0.05 % EX CREA: TOPICAL_CREAM | Freq: Two times a day (BID) | CUTANEOUS | 1 refills | Status: AC | PRN

## 2024-12-30 MED ORDER — TERCONAZOLE 0.4 % VA CREA: TOPICAL_CREAM | VAGINAL | 1 refills | Status: AC

## 2024-12-30 MED ORDER — ESTRADIOL 0.01 % VA CREA: TOPICAL_CREAM | VAGINAL | 1 refills | Status: AC

## 2024-12-30 MED ORDER — VALACYCLOVIR HCL 500 MG PO TABS: ORAL_TABLET | ORAL | 1 refills | Status: AC

## 2024-12-30 MED ORDER — CLOBETASOL PROPIONATE 0.05 % EX OINT: TOPICAL_OINTMENT | CUTANEOUS | 1 refills | Status: AC

## 2024-12-30 NOTE — Patient Instructions (Signed)
 I value your feedback and you entrusting [place] with your care. If you get a [place] patient survey, I would appreciate you taking the time to let us know about your experience today. Thank you! ? ? ?

## 2025-02-25 ENCOUNTER — Ambulatory Visit: Admitting: Nurse Practitioner
